# Patient Record
Sex: Male | Born: 1960 | Race: White | Hispanic: No | Marital: Single | Smoking: Former smoker
Health system: Southern US, Community
[De-identification: ages and names within clinical notes are randomized; demographics above are authoritative.]

## PROBLEM LIST (undated history)

## (undated) DIAGNOSIS — R569 Unspecified convulsions: Secondary | ICD-10-CM

## (undated) DIAGNOSIS — I10 Essential (primary) hypertension: Secondary | ICD-10-CM

## (undated) DIAGNOSIS — K250 Acute gastric ulcer with hemorrhage: Secondary | ICD-10-CM

## (undated) DIAGNOSIS — N200 Calculus of kidney: Secondary | ICD-10-CM

## (undated) DIAGNOSIS — Z87442 Personal history of urinary calculi: Secondary | ICD-10-CM

## (undated) HISTORY — DX: Essential (primary) hypertension: I10

## (undated) HISTORY — DX: Unspecified convulsions: R56.9

## (undated) HISTORY — DX: Personal history of urinary calculi: Z87.442

## (undated) HISTORY — DX: Calculus of kidney: N20.0

## (undated) HISTORY — PX: HAND SURGERY: SHX662

## (undated) HISTORY — DX: Acute gastric ulcer with hemorrhage: K25.0

---

## 1998-05-15 ENCOUNTER — Inpatient Hospital Stay (HOSPITAL_COMMUNITY): Admission: EM | Admit: 1998-05-15 | Discharge: 1998-05-21 | Payer: Self-pay | Admitting: Family Medicine

## 2002-01-18 ENCOUNTER — Encounter: Payer: Self-pay | Admitting: Preventative Medicine

## 2002-01-18 ENCOUNTER — Ambulatory Visit (HOSPITAL_COMMUNITY): Admission: RE | Admit: 2002-01-18 | Discharge: 2002-01-18 | Payer: Self-pay | Admitting: Preventative Medicine

## 2002-01-24 ENCOUNTER — Encounter (HOSPITAL_COMMUNITY): Admission: RE | Admit: 2002-01-24 | Discharge: 2002-02-23 | Payer: Self-pay | Admitting: Preventative Medicine

## 2006-09-17 ENCOUNTER — Ambulatory Visit (HOSPITAL_COMMUNITY): Admission: RE | Admit: 2006-09-17 | Discharge: 2006-09-17 | Payer: Self-pay | Admitting: Family Medicine

## 2006-09-17 ENCOUNTER — Encounter: Payer: Self-pay | Admitting: Vascular Surgery

## 2006-09-22 ENCOUNTER — Ambulatory Visit (HOSPITAL_COMMUNITY): Admission: RE | Admit: 2006-09-22 | Discharge: 2006-09-22 | Payer: Self-pay | Admitting: *Deleted

## 2006-09-30 ENCOUNTER — Ambulatory Visit (HOSPITAL_COMMUNITY): Admission: RE | Admit: 2006-09-30 | Discharge: 2006-09-30 | Payer: Self-pay | Admitting: Orthopedic Surgery

## 2006-09-30 ENCOUNTER — Encounter: Payer: Self-pay | Admitting: Cardiology

## 2008-12-17 ENCOUNTER — Inpatient Hospital Stay (HOSPITAL_COMMUNITY): Admission: EM | Admit: 2008-12-17 | Discharge: 2008-12-21 | Payer: Self-pay | Admitting: Emergency Medicine

## 2008-12-18 ENCOUNTER — Encounter: Payer: Self-pay | Admitting: Gastroenterology

## 2008-12-18 ENCOUNTER — Ambulatory Visit: Payer: Self-pay | Admitting: Gastroenterology

## 2008-12-19 ENCOUNTER — Encounter: Payer: Self-pay | Admitting: Gastroenterology

## 2008-12-20 ENCOUNTER — Encounter: Payer: Self-pay | Admitting: Gastroenterology

## 2008-12-25 ENCOUNTER — Encounter: Payer: Self-pay | Admitting: Gastroenterology

## 2008-12-27 ENCOUNTER — Telehealth (INDEPENDENT_AMBULATORY_CARE_PROVIDER_SITE_OTHER): Payer: Self-pay | Admitting: *Deleted

## 2008-12-27 ENCOUNTER — Encounter: Payer: Self-pay | Admitting: Gastroenterology

## 2008-12-28 ENCOUNTER — Ambulatory Visit: Payer: Self-pay | Admitting: Gastroenterology

## 2008-12-28 DIAGNOSIS — Z87442 Personal history of urinary calculi: Secondary | ICD-10-CM

## 2008-12-28 DIAGNOSIS — G40909 Epilepsy, unspecified, not intractable, without status epilepticus: Secondary | ICD-10-CM | POA: Insufficient documentation

## 2008-12-28 DIAGNOSIS — K250 Acute gastric ulcer with hemorrhage: Secondary | ICD-10-CM | POA: Insufficient documentation

## 2008-12-28 DIAGNOSIS — M129 Arthropathy, unspecified: Secondary | ICD-10-CM | POA: Insufficient documentation

## 2008-12-28 DIAGNOSIS — E785 Hyperlipidemia, unspecified: Secondary | ICD-10-CM | POA: Insufficient documentation

## 2008-12-28 HISTORY — DX: Acute gastric ulcer with hemorrhage: K25.0

## 2008-12-28 HISTORY — DX: Personal history of urinary calculi: Z87.442

## 2011-01-22 LAB — CBC
HCT: 23.6 % — ABNORMAL LOW (ref 39.0–52.0)
HCT: 23.7 % — ABNORMAL LOW (ref 39.0–52.0)
HCT: 24.1 % — ABNORMAL LOW (ref 39.0–52.0)
HCT: 25.1 % — ABNORMAL LOW (ref 39.0–52.0)
Hemoglobin: 5.9 g/dL — CL (ref 13.0–17.0)
Hemoglobin: 7.7 g/dL — CL (ref 13.0–17.0)
Hemoglobin: 8.6 g/dL — ABNORMAL LOW (ref 13.0–17.0)
MCHC: 35.1 g/dL (ref 30.0–36.0)
MCHC: 35.1 g/dL (ref 30.0–36.0)
MCHC: 35.6 g/dL (ref 30.0–36.0)
MCV: 89.6 fL (ref 78.0–100.0)
MCV: 90.2 fL (ref 78.0–100.0)
MCV: 90.4 fL (ref 78.0–100.0)
MCV: 90.8 fL (ref 78.0–100.0)
Platelets: 209 10*3/uL (ref 150–400)
Platelets: 212 10*3/uL (ref 150–400)
Platelets: 216 10*3/uL (ref 150–400)
Platelets: 216 10*3/uL (ref 150–400)
RBC: 2.4 MIL/uL — ABNORMAL LOW (ref 4.22–5.81)
RBC: 2.63 MIL/uL — ABNORMAL LOW (ref 4.22–5.81)
RBC: 2.66 MIL/uL — ABNORMAL LOW (ref 4.22–5.81)
RDW: 13.7 % (ref 11.5–15.5)
RDW: 14.1 % (ref 11.5–15.5)
RDW: 14.5 % (ref 11.5–15.5)
WBC: 4.7 10*3/uL (ref 4.0–10.5)
WBC: 5.1 10*3/uL (ref 4.0–10.5)
WBC: 5.2 10*3/uL (ref 4.0–10.5)
WBC: 5.8 10*3/uL (ref 4.0–10.5)
WBC: 6.1 10*3/uL (ref 4.0–10.5)

## 2011-01-22 LAB — DIFFERENTIAL
Basophils Absolute: 0 10*3/uL (ref 0.0–0.1)
Eosinophils Relative: 2 % (ref 0–5)
Lymphocytes Relative: 24 % (ref 12–46)
Lymphocytes Relative: 25 % (ref 12–46)
Lymphs Abs: 1.2 10*3/uL (ref 0.7–4.0)
Monocytes Absolute: 0.4 10*3/uL (ref 0.1–1.0)
Monocytes Absolute: 0.4 10*3/uL (ref 0.1–1.0)
Neutro Abs: 3.4 10*3/uL (ref 1.7–7.7)
Neutro Abs: 5.8 10*3/uL (ref 1.7–7.7)
Neutrophils Relative %: 71 % (ref 43–77)

## 2011-01-22 LAB — PROTIME-INR
INR: 1.1 (ref 0.00–1.49)
Prothrombin Time: 14.8 seconds (ref 11.6–15.2)

## 2011-01-22 LAB — COMPREHENSIVE METABOLIC PANEL
AST: 14 U/L (ref 0–37)
Albumin: 2.7 g/dL — ABNORMAL LOW (ref 3.5–5.2)
Calcium: 8.2 mg/dL — ABNORMAL LOW (ref 8.4–10.5)
Creatinine, Ser: 0.6 mg/dL (ref 0.4–1.5)
GFR calc Af Amer: >60 mL/min (ref >60)

## 2011-01-22 LAB — CROSSMATCH: Antibody Screen: NEGATIVE

## 2011-01-22 LAB — ABO/RH: ABO/RH(D): A POS

## 2011-01-22 LAB — POCT I-STAT, CHEM 8
BUN: 15 mg/dL (ref 6–23)
Calcium, Ion: 1.14 mmol/L (ref 1.12–1.32)
Chloride: 105 mEq/L (ref 96–112)
HCT: 17 % — ABNORMAL LOW (ref 39.0–52.0)
Potassium: 3.7 mEq/L (ref 3.5–5.1)
Sodium: 136 mEq/L (ref 135–145)

## 2011-01-22 LAB — PHENYTOIN LEVEL, TOTAL: Phenytoin Lvl: 8 ug/mL — ABNORMAL LOW (ref 10.0–20.0)

## 2011-01-22 LAB — LACTIC ACID, PLASMA: Lactic Acid, Venous: 2.4 mmol/L — ABNORMAL HIGH (ref 0.5–2.2)

## 2011-02-24 NOTE — H&P (Signed)
NAME:  Melvin Villarreal, Melvin Villarreal NO.:  [id number]   MEDICAL RECORD NO.:  [id number]          PATIENT TYPE:  EMS   LOCATION:  MAJO                         FACILITY:  [facility]   PHYSICIAN:  Lonia Blood, M.D.DATE OF BIRTH:  1960-11-15   DATE OF ADMISSION:  12/17/2008  DATE OF DISCHARGE:                              HISTORY & PHYSICAL   PRIMARY CARE PHYSICIAN:  Production assistant, radio.   CHIEF COMPLAINT:  Black stools and dizziness   HISTORY OF PRESENT ILLNESS:  [name] is a very pleasant,  otherwise reasonably healthy 50 year old gentleman with no history of GI  bleeding, who reported to the hospital today with a 4-day history of  dark pitch-black stools.  He reports multiple episodes a day.  He then  began to develop severe constipation and over the last 24 hours required  significant doses of a laxative to stimulate bowel movement which then  also was markedly dark and melanotic.  As stated this began 4 days ago.  Approximately 3 days ago he began to notice that he was getting weak  and could not exert himself physically without becoming very short of  breath.  Approximately 2 days ago he then began to notice that he was  losing my color with an extremely pale skin noted at the face.  Today,  the patient suffered an episode where he almost passed out after getting  up to go to the bathroom.  He developed severe lightheadedness and had  to sit down.  After sitting down his symptoms still continued for a  matter of minutes.  He has not lost consciousness.  He has had no bright  red blood per rectum.  He vehemently denies even minimal use of  nonsteroidal anti-inflammatories.  He has no prior history of GI  bleeding of any sort.  He does smoke one pack per day but he does not  drink at all.  He has no known history of reflux. The patient denies any  abdominal pain whatsoever apart from moderate cramping that he relates  to his constipation.   REVIEW OF  SYSTEMS:  As comprehensive review of systems is carried out  but is unremarkable with the exception of the positive elements noted in  the history of present illness above.   PAST MEDICAL HISTORY:  1. Seizure disorder.      a.     The patient cannot remember the last time that he had a       seizure.      b.     Ongoing use of Dilantin with no recent check of his levels.  2. History of ischemic left hand.      a.     Etiology not clear.      b.     Status post revascularization procedure at [facility].      c.     On Plavix since that time - 2008.   OUTPATIENT MEDICATIONS:  1. Plavix 75 mg daily.  2. Dilantin 800 mg q.h.s.   ALLERGIES:  No known drug allergies.   FAMILY HISTORY:  The patient's mother is alive and well.  She has had a  bout with breast cancer and apparently renal cell carcinoma but has no  history of gastric or colon cancer.  The patient's father is alive and  healthy with no history of cancer of any kind.   SOCIAL HISTORY:  [place]  He is  currently engaged.  He smokes one pack a day of cigarettes. He does not  drink alcohol at all.  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   DATA REVIEW:  His hemoglobin is 5.9, MCV is 92. White count and platelet  count are normal.  Electrolytes are all balanced. Creatinine is 0.7, BUN  is 15.  LFTs are normal.  Albumin is low at 2.7 with a low total protein  of 5.2, INR is 1.1.  Lactic acid is 2.4 which is borderline elevated.  The patient is guaiac positive of course with melanotic stools  appreciable.   PHYSICAL EXAMINATION:  VITAL SIGNS:  Temperature 98.3, blood pressure  140/77, heart rate 110, respiratory rate 20, O2 sat 1000% on room air.  GENERAL:  Well-developed, well-nourished. indeed pale gentleman in no  acute respiratory distress.  HEENT:  Normocephalic, atraumatic.  Oral cavity unremarkable with the  exception of dry mucous membranes.  NECK:   No JVD.  LUNGS: Clear to auscultation bilaterally without wheeze or rhonchi.  CARDIOVASCULAR:  Tachycardic but regular without appreciable murmur,  gallop or rub with normal S1 and S2.  ABDOMEN:  Overweight but soft bowel sounds present.  There is no  organomegaly, and there is no rebound.  There is no ascites.  EXTREMITIES: There is no significant cyanosis, clubbing, edema to  bilateral lower extremities.  NEUROLOGIC: Nonfocal neurologic exam.   IMPRESSION AND PLAN:  1. Acute/subacute upper gastrointestinal bleed with melena - the      patient is being admitted to the acute units.  We will place him on      Protonix drip.  He will be allowed clear liquid sips  tonight and      then we will transition him to n.p.o. status at midnight in      preparation for an EGD in the morning.  I have contacted the on-      call gastroenterologist who has agreed to evaluate the patient in      the morning for EGD and has made himself available for the night      should acute issues arise.  I do feel the patient need to be      stabilized with transfusion, however, prior to instrumentation.  2. Acute blood loss anemia - the source of the patient's blood loss is      clear.  He does present, however, with a severe symptomatic anemia.      We will began treatment transfusion of a minimum of 3 units of      packed red blood cells.  We will then reassess CBC on a q.8 h.      basis post transfusion.  We will continue to transfuse as needed to      maintain a hemoglobin of 8 or greater.  3. Seizure disorder - the patient cannot remember his last seizure.  I      will continue his Dilantin at the current dose but would not change      dosing regardless of his Dilantin levels in that he is not seizing  and therefore will not check a Dilantin level.  It is arguable that      the patient could even be tried without Dilantin given his      extremely long seizure-free period.  However, now is not the time       to make that adjustment.  4. Tobacco abuse - I have counseled the patient as to the multiple      deleterious effects of ongoing tobacco abuse.  I      have advised him that absolute abstinence is the most appropriate      action.  We will give him a nicotine patch during his hospital stay      and we will request tobacco cessation consultation to further drive      this point home during his hospital stay.      Lonia Blood, M.D.  Electronically Signed     JTM/MEDQ  D:  12/17/2008  T:  12/17/2008  Job:  [id number]   cc:   [facility]

## 2011-02-24 NOTE — Discharge Summary (Signed)
NAME:  Melvin Villarreal, Melvin Villarreal NO.:  [id number]   MEDICAL RECORD NO.:  [id number]          PATIENT TYPE:  INP   LOCATION:  5158                         FACILITY:  [facility]   PHYSICIAN:  Altha Harm, MDDATE OF BIRTH:  January 08, 1961   DATE OF ADMISSION:  12/17/2008  DATE OF DISCHARGE:  12/21/2008                               DISCHARGE SUMMARY   DISCHARGE DISPOSITION:  Home.   FINAL DISCHARGE DIAGNOSES:  1. Upper gastrointestinal bleed.  2. Acute blood loss anemia, hemoglobin now stable.  3. Helicobacter pylori.  4. Questionable mucosa-associated lymphoid tissue.  5. History of seizure disorder.  6. 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  7. Tobacco use disorder.   Discharge medications include the following:  1. Dilantin 800 mg p.o. bedtime.  2. Prevpac use as directed per Dr. Arlyce Dice, GI.  3. Protonix 40 mg p.o. daily.   Medication discontinued at this time includes Plavix 75 mg.  The patient  is supposed to receive direction from Dr. Arlyce Dice as to when he can  restart the Plavix.   CONSULTANT:  Barbette Hair. Arlyce Dice, M.D., [facility], gastroenterology.   PROCEDURES:  1. EGD, which showed an ulcer in the body of the stomach, otherwise      normal examination.  2. Colonoscopy with findings:  Terminal ileum appearing normal and a      normal colon.   DIAGNOSTIC STUDIES:  1. Meckel scan which was negative.  2. Endoscopic capsule results are still pending.   PERTINENT LABORATORY STUDIES:  Pathology report shows chronic active  gastritis with H. pylori organism.  Atypical lymphoid infiltrate could  likely represent lymphoproliferative process, specifically MALT.  Hemoglobin at the time of discharge 9.0, hematocrit 25.3.   CODE STATUS:  Full code.   ALLERGIES:  No known drug allergies.   CHIEF COMPLAINT:  Black stools and dizziness.   HISTORY OF PRESENT ILLNESS:  Please refer to the H and P by Dr.  Sharon Seller  for details of the HPI.   HOSPITAL COURSE:  1. The patient was admitted to the hospital and given supportive care.      In particular, he received transfusion of 2 units of packed red      blood cells initially on admission, and an additional 2 units of      packed red blood cells during his hospital stay for a total of 4      units of packed red blood cells.  The patient continued to have      melanotic stools and a gastrointestinal endoscopic examination was      embarked upon.  The findings are as noted above.  The investigation      still is not complete; however, the patient is clinically stable to      be discharged home both on my assessment and the assessment of Dr.      Arlyce Dice.  The patient is able to tolerate his diet at this time      without any difficulty, and he will follow up  with Dr. Arlyce Dice in      the office for an appointment that is scheduled on January 15, 2009 at      1:30 p.m.  At that time Dr. Arlyce Dice will further discuss with the      patient the results of the Endo Capsule and any further studies      that need to be carried out at that time.  In terms of his use of      Plavix, the patient had revascularization procedure of his left      upper extremity done several years ago at [facility].  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  He was then put on Plavix at that      time for maintenance of his revascularization.  However, in light      of the patient's GI bleed, the Plavix has been held at this time,      and the patient is supposed to check with Dr. Arlyce Dice to determine      when the Plavix can be restarted.  Attempts were made to reach the      vascular surgeon at [facility], however, it appears that he      is currently unavailable.  2. Seizure disorder:  The patient is continued on his Dilantin.  He      has had no seizure activity during this hospitalization.    Otherwise, the patient is stable.  He is tolerating diet well.  He is  able to ambulate without any difficulty.  Vital signs are all stable  today and are as follows:  Temperature 97.5, heart rate 75, blood  pressure 135/82, respiratory rate 18, O2 saturation is 97% on room air.  The patient is being discharged on the above medications.   DIETARY RESTRICTIONS:  None.   PHYSICAL RESTRICTIONS:  None.   FOLLOWUP:  Is with Dr. Arlyce Dice at appointment January 15, 2009 at 1:30, and  with his primary care physician at  [facility].   Total time for discharge 49 minutes.      Altha Harm, MD  Electronically Signed     MAM/MEDQ  D:  12/21/2008  T:  12/21/2008  Job:  [id number]   cc:   Barbette Hair. Arlyce Dice, MD,FACG  [facility]

## 2013-03-09 ENCOUNTER — Other Ambulatory Visit: Payer: Self-pay | Admitting: Family Medicine

## 2013-03-09 ENCOUNTER — Ambulatory Visit
Admission: RE | Admit: 2013-03-09 | Discharge: 2013-03-09 | Disposition: A | Discharge status: Home / Self Care | Payer: Managed Care, Other (non HMO) | Source: Ambulatory Visit | Attending: Family Medicine | Admitting: Family Medicine

## 2013-03-09 DIAGNOSIS — M545 Low back pain: Secondary | ICD-10-CM

## 2013-03-20 ENCOUNTER — Ambulatory Visit: Payer: Managed Care, Other (non HMO) | Attending: Family Medicine | Admitting: Physical Therapy

## 2013-03-20 DIAGNOSIS — M545 Low back pain, unspecified: Secondary | ICD-10-CM | POA: Insufficient documentation

## 2013-03-20 DIAGNOSIS — M546 Pain in thoracic spine: Secondary | ICD-10-CM | POA: Insufficient documentation

## 2013-03-20 DIAGNOSIS — R5381 Other malaise: Secondary | ICD-10-CM | POA: Insufficient documentation

## 2013-03-20 DIAGNOSIS — IMO0001 Reserved for inherently not codable concepts without codable children: Secondary | ICD-10-CM | POA: Insufficient documentation

## 2013-03-23 ENCOUNTER — Ambulatory Visit: Payer: Managed Care, Other (non HMO) | Admitting: *Deleted

## 2013-03-29 ENCOUNTER — Ambulatory Visit: Payer: Managed Care, Other (non HMO) | Admitting: *Deleted

## 2013-03-31 ENCOUNTER — Ambulatory Visit: Payer: Managed Care, Other (non HMO)

## 2013-04-05 ENCOUNTER — Ambulatory Visit: Payer: Managed Care, Other (non HMO) | Admitting: Physical Therapy

## 2013-04-07 ENCOUNTER — Ambulatory Visit: Payer: Managed Care, Other (non HMO) | Admitting: Physical Therapy

## 2013-04-12 ENCOUNTER — Ambulatory Visit: Payer: Managed Care, Other (non HMO) | Attending: Family Medicine | Admitting: Physical Therapy

## 2013-04-12 DIAGNOSIS — IMO0001 Reserved for inherently not codable concepts without codable children: Secondary | ICD-10-CM | POA: Insufficient documentation

## 2013-04-12 DIAGNOSIS — M545 Low back pain, unspecified: Secondary | ICD-10-CM | POA: Insufficient documentation

## 2013-04-12 DIAGNOSIS — M546 Pain in thoracic spine: Secondary | ICD-10-CM | POA: Insufficient documentation

## 2013-04-12 DIAGNOSIS — R5381 Other malaise: Secondary | ICD-10-CM | POA: Insufficient documentation

## 2013-04-19 ENCOUNTER — Ambulatory Visit: Payer: Managed Care, Other (non HMO) | Admitting: Physical Therapy

## 2013-04-21 ENCOUNTER — Ambulatory Visit: Payer: Managed Care, Other (non HMO) | Admitting: *Deleted

## 2013-04-26 ENCOUNTER — Ambulatory Visit: Payer: Managed Care, Other (non HMO) | Admitting: Physical Therapy

## 2013-04-28 ENCOUNTER — Ambulatory Visit: Payer: Managed Care, Other (non HMO) | Admitting: *Deleted

## 2013-05-03 ENCOUNTER — Ambulatory Visit: Payer: Managed Care, Other (non HMO) | Admitting: *Deleted

## 2015-11-15 DIAGNOSIS — N5089 Other specified disorders of the male genital organs: Secondary | ICD-10-CM | POA: Insufficient documentation

## 2015-11-15 DIAGNOSIS — F17210 Nicotine dependence, cigarettes, uncomplicated: Secondary | ICD-10-CM | POA: Insufficient documentation

## 2017-09-20 ENCOUNTER — Ambulatory Visit: Payer: Self-pay | Admitting: Family Medicine

## 2017-09-28 ENCOUNTER — Ambulatory Visit (INDEPENDENT_AMBULATORY_CARE_PROVIDER_SITE_OTHER): Payer: PRIVATE HEALTH INSURANCE

## 2017-09-28 ENCOUNTER — Encounter: Payer: Self-pay | Admitting: Family

## 2017-09-28 ENCOUNTER — Ambulatory Visit (INDEPENDENT_AMBULATORY_CARE_PROVIDER_SITE_OTHER): Payer: PRIVATE HEALTH INSURANCE | Admitting: Family

## 2017-09-28 VITALS — BP 155/70 | HR 81 | Temp 97.2°F | Ht 72.0 in | Wt 374.8 lb

## 2017-09-28 DIAGNOSIS — M1711 Unilateral primary osteoarthritis, right knee: Secondary | ICD-10-CM

## 2017-09-28 DIAGNOSIS — G8929 Other chronic pain: Secondary | ICD-10-CM

## 2017-09-28 DIAGNOSIS — R609 Edema, unspecified: Secondary | ICD-10-CM

## 2017-09-28 DIAGNOSIS — F17200 Nicotine dependence, unspecified, uncomplicated: Secondary | ICD-10-CM | POA: Diagnosis not present

## 2017-09-28 DIAGNOSIS — I10 Essential (primary) hypertension: Secondary | ICD-10-CM

## 2017-09-28 DIAGNOSIS — M25561 Pain in right knee: Secondary | ICD-10-CM | POA: Diagnosis not present

## 2017-09-28 MED ORDER — MELOXICAM 15 MG PO TABS: 15.0000 mg | ORAL_TABLET | Freq: Every day | ORAL | 0 refills | Status: DC

## 2017-09-28 NOTE — Patient Instructions (Signed)
Knee Osteoarthritis Treatment Decision Aid Introduction If you have osteoarthritis in your knees, you may have several treatment options. This document will review two of these options.  What are my treatment options? ? Surgery ? Non-surgical treatment may include one option or a combination of several of the following options:: Surgery for this condition is total knee replacement. ? Exercise programs and working with a physical therapist.: This is a procedure to replace the knee joint with an artificial (prosthetic) knee joint. ? Devices to help you move around (assistive devices), like a brace, wrap, splint, or wedge insoles.: It replaces parts of the thigh bone (femur), lower leg bone (tibia), and kneecap (patella) that are removed during the procedure. ? Medicines.: ? Shots of medicine to relieve pain and inflammation, such as steroids and hyaluronic acid.: ? Using a small device which delivers mild pulses through to nerve endings to relieve pain (transcutaneous electrical stimulation, TENS).: ? Applying heat and cold to the knee.: ? Massage.: ? Insertion of needles into certain places on the skin to relieve pain (acupuncture).: ? A plan to control your weight or to lose weight, if you are overweight.:  Who is eligible?  Good candidates may include: ? Surgery ? People who have pain that is tolerable.: People who have pain that does not get better with non-surgical treatments and medicines. ? People who are not allergic to medicines that are used.: People who have moderate or severe pain that makes it hard to do physical activities like walking, rising from a chair, or using stairs. ? People who do not have an illness that makes medicines or injections risky.: People who have pain that affects quality of sleep. ? People who are motivated to do exercise programs or lose weight.: People who do not have an illness that makes surgery risky. ? People who do not have an infection, too much  fluid, or skin breakdown in the knee area.: What are the benefits?  Benefits may include: ? Surgery ? Improved mobility and less pain.: Improved mobility and less pain. ? Not needing surgery.: Pain likely being gone within 2 years after surgery. ? Pain relief that lasts for 6-12 months, starting 1-2 days after steroid injections.: ? Pain relief that lasts for several months, starting several weeks after hyaluronic acid injections.: What are the risks?  Risks may include: ? Surgery ? Failure to relieve symptoms.: Failure to relieve symptoms. ? Joint damage that may continue to get worse.: Instability of the knee. ? Medicine or injection side effects.: Damage to blood vessels, nerves, or other structures in the knee. ? Bruising or bleeding with acupuncture therapy.: Bleeding or a blood clot. ? Skin injury due to incorrect application of heat or cold therapy.: Infection. ? Bruising due to massage.: Decreased range of motion of the knee. ? Increased pain due to exercise programs.: Loosening of the prosthetic joint. ? : Needing knee replacement surgery again in 15-20 years to replace the prosthetic joint. What preparation is needed?  Preparation may include: ? Surgery ? Physical evaluation. This may involve lab work and imaging tests, such as X-rays, MRI, CT scan, or bone scans.: Physical evaluation. This may involve lab work and imaging tests, such as X-rays, MRI, CT scan, or bone scans. ? Planning to have someone take you home from the hospital or clinic, if you have knee injections.: Planning to have someone take you home from the hospital. ? : Arranging for someone to help you for a few days after surgery. ? : Working  with specialists, if you have other medical conditions. ? : Preparing your home for your recovery. ? : Losing weight, if recommended by your provider. ? : Not using any products that contain nicotine or tobacco, such as cigarettes and e-cigarettes. If you need help  quitting, ask your health care provider. ? : Doing exercises as directed by your provider. ? : Having dental care and routine cleanings completed before your procedure. (You should not have dental work done for 3 months after your procedure.) The exact tests and frequency of office visits will be decided by your health care provider based on your individual condition. What are the restrictions after?  Restrictions may include: ? Surgery ? Needing to rest after pain-relief therapies.: Not taking baths, swimming, or using a hot tub until your provider approves. ? Not driving, if you are taking medicines that make you sleepy.: Not playing contact sports until your provider approves. ? Avoiding activities that require a lot of energy for a couple of days after knee injections.: Not driving or using heavy machinery until your provider approves. What can I expect from recovery?  You may experience: ? Surgery ? Tiredness, after therapies for pain relief.: Pain. Medicines and pain relief techniques will help make pain tolerable. ? Needing to move your knee through its full range of motion after knee injections, to get all of the medicine into your knee joint.: Fluid draining from your incision through a tube (drain) for a couple of days. ? Monitoring for a few hours after knee injections to make sure you do not have a reaction to the medicine.: Limited range of motion in the knee that gradually improves over time. What is the impact to quality of life?  Impact to quality of life may include: ? Surgery ? Living with certain side effects of medicines.: Needing to stay in the hospital for a few days after your procedure. ? Needing up to five injections over a period of five weeks, for certain kinds of hyaluronic acid injections.: Needing to use a walker for several weeks. ? Limited physical activity, depending on your symptoms.: Limited physical activity for several weeks. ? Impact to your daily  schedule, including work, to make time for therapy and follow-up visits.: Impact to your daily schedule, including work, to make time for therapy and follow-up visits. ? : ? : The frequency of office visits, therapy, and injections will be decided by your health care provider and care team based on your individual condition. What is the cost?  Costs may include: ? Surgery - $$$ ? Ongoing medicines.: Procedures and recovery. ? Office visits.: Office visits. ? Therapy visits.: Therapy visits. ? Knee injection procedures.: ? Assistive devices.: Exact costs will vary based on your location and insurance plan. Contact your insurance company to find out what is covered. Questions to ask your health care provider:  Ask: ? Surgery ? Am I eligible for this option?: Am I eligible for this option? ? What are my personal risks?: What are my personal risks? ? How often will I need office or therapy visits?: How often will I need office or therapy visits? ? What are the chances that I will need future treatment?: What are the chances that I will need another surgery? ? : How effective is the surgery? Follow these instructions at home: Review these options and decide which one may be right for you.  My decision: ? Non-Surgical Treatment ? This is right for me: ? This is not right for  me: ? I am unsure right now: ? Surgery ? This is right for me: ? This is not right for me: ? I am unsure right now:  This information is not intended to replace advice given to you by your health care provider. Make sure you discuss any questions you have with your health care provider. Document Released: 08/20/2016 Document Revised: 08/20/2016 Document Reviewed: 08/20/2016 Elsevier Interactive Patient Education  [employer].

## 2017-09-28 NOTE — Progress Notes (Signed)
Subjective:    Patient ID: Melvin Villarreal, male    DOB: 02-11-1961, 56 y.o.   MRN: [id number]  PT presents to the office today to establish care and complaints of right knee pain. Pt also complaining of chronic peripheral edema with discoloration of bilateral legs. Pt currently taking lasix that helps, but at his job is "contstantly on my feet and that makes it worse".  Knee Pain   The incident occurred more than 1 week ago. Injury mechanism: previous injury when younger. The pain is present in the right knee. The quality of the pain is described as aching. The pain is at a severity of 6/10. The pain is moderate. The pain has been intermittent since onset. Pertinent negatives include no numbness or tingling. He reports no foreign bodies present. The symptoms are aggravated by weight bearing. He has tried rest, NSAIDs and acetaminophen for the symptoms. The treatment provided mild relief.  Hypertension  This is a chronic problem. The current episode started more than 1 year ago. The problem has been waxing and waning since onset. The problem is uncontrolled. Associated symptoms include peripheral edema.      Review of Systems  Neurological: Negative for tingling and numbness.  All other systems reviewed and are negative.  History reviewed. No pertinent family history.  Social History   Socioeconomic History  . Marital status: Single    Spouse name: None  . Number of children: None  . Years of education: None  . Highest education level: None  Social Needs  . Financial resource strain: None  . Food insecurity - worry: None  . Food insecurity - inability: None  . Transportation needs - medical: None  . Transportation needs - non-medical: None  Occupational History  . None  Tobacco Use  . Smoking status: Current Every Day Smoker    Packs/day: 1.00    Years: 35.00    Pack years: 35.00    Types: Cigarettes  . Smokeless tobacco: Never Used  Substance and Sexual Activity  .  Alcohol use: No    Frequency: Never  . Drug use: Yes    Types: "Crack" cocaine  . Sexual activity: None  Other Topics Concern  . None  Social History Narrative  . None       Objective:   Physical Exam  Constitutional: He is oriented to person, place, and time. He appears well-developed and well-nourished. No distress.  HENT:  Head: Normocephalic.  Eyes: Pupils are equal, round, and reactive to light. Right eye exhibits no discharge. Left eye exhibits no discharge.  Neck: Normal range of motion. Neck supple. No thyromegaly present.  Cardiovascular: Normal rate, regular rhythm, normal heart sounds and intact distal pulses.  No murmur heard. Pulmonary/Chest: Effort normal and breath sounds normal. No respiratory distress. He has no wheezes.  Musculoskeletal: Normal range of motion. He exhibits edema (2+ BLE) and tenderness (right knee tenderness with flexion and extension, crepitus present).  Neurological: He is alert and oriented to person, place, and time.  Skin: Skin is warm and dry. No rash noted. No erythema.  Psychiatric: He has a normal mood and affect. His behavior is normal. Judgment and thought content normal.  Vitals reviewed.    BP (!) 155/70   Pulse 81   Temp (!) 97.2 F (36.2 C) (Oral)   Ht 6' (1.829 m)   Wt (!) 374 lb 12.8 oz (170 kg)   BMI 50.83 kg/m      Assessment & Plan:  1.  Chronic pain of right knee - DG Knee 1-2 Views Right; Future - meloxicam (MOBIC) 15 MG tablet; Take 1 tablet (15 mg total) by mouth daily.  Dispense: 30 tablet; Refill: 0  2. Primary osteoarthritis of right knee Rest Ice  ROM exercises  Stop aleve and start mobic daily- No other NSAID's If pain does not improve will do joint injection and/or referral to Ortho - DG Knee 1-2 Views Right; Future - meloxicam (MOBIC) 15 MG tablet; Take 1 tablet (15 mg total) by mouth daily.  Dispense: 30 tablet; Refill: 0 -BMP  3. Current smoker Smoking cessation discussed  4. Morbid obesity  ([place]) Weight loss will help with pain  5. Peripheral edema Continue lasix  - Compression stockings    6. Essential hypertension Continue medications Follow up in 1 month if BP still elevated will add medications BMP pending    Evelina Dun, FNP

## 2017-09-29 LAB — BMP8+EGFR
BUN/Creatinine Ratio: 18 (ref 9–20)
BUN: 12 mg/dL (ref 6–24)
CALCIUM: 9.1 mg/dL (ref 8.7–10.2)
CO2: 27 mmol/L (ref 20–29)
CREATININE: 0.68 mg/dL — AB (ref 0.76–1.27)
Chloride: 103 mmol/L (ref 96–106)
GFR calc Af Amer: 125 mL/min/{1.73_m2} (ref >59)
GFR, EST NON AFRICAN AMERICAN: 108 mL/min/{1.73_m2} (ref >59)
GLUCOSE: 94 mg/dL (ref 65–99)
Potassium: 4.3 mmol/L (ref 3.5–5.2)
Sodium: 143 mmol/L (ref 134–144)

## 2017-10-25 ENCOUNTER — Other Ambulatory Visit: Payer: Self-pay | Admitting: Family

## 2017-10-25 DIAGNOSIS — M25561 Pain in right knee: Principal | ICD-10-CM

## 2017-10-25 DIAGNOSIS — G8929 Other chronic pain: Secondary | ICD-10-CM

## 2017-10-25 DIAGNOSIS — M1711 Unilateral primary osteoarthritis, right knee: Secondary | ICD-10-CM

## 2017-11-01 ENCOUNTER — Encounter: Payer: Self-pay | Admitting: Family

## 2017-11-01 ENCOUNTER — Ambulatory Visit (INDEPENDENT_AMBULATORY_CARE_PROVIDER_SITE_OTHER): Payer: 59 | Admitting: Family

## 2017-11-01 VITALS — BP 152/87 | HR 76 | Temp 97.2°F | Ht 72.0 in | Wt 384.6 lb

## 2017-11-01 DIAGNOSIS — Z1159 Encounter for screening for other viral diseases: Secondary | ICD-10-CM

## 2017-11-01 DIAGNOSIS — G40909 Epilepsy, unspecified, not intractable, without status epilepticus: Secondary | ICD-10-CM

## 2017-11-01 DIAGNOSIS — R609 Edema, unspecified: Secondary | ICD-10-CM

## 2017-11-01 DIAGNOSIS — Z1211 Encounter for screening for malignant neoplasm of colon: Secondary | ICD-10-CM

## 2017-11-01 DIAGNOSIS — F17200 Nicotine dependence, unspecified, uncomplicated: Secondary | ICD-10-CM

## 2017-11-01 DIAGNOSIS — I739 Peripheral vascular disease, unspecified: Secondary | ICD-10-CM

## 2017-11-01 DIAGNOSIS — E785 Hyperlipidemia, unspecified: Secondary | ICD-10-CM

## 2017-11-01 DIAGNOSIS — R600 Localized edema: Secondary | ICD-10-CM

## 2017-11-01 DIAGNOSIS — I10 Essential (primary) hypertension: Secondary | ICD-10-CM

## 2017-11-01 DIAGNOSIS — Z0000 Encounter for general adult medical examination without abnormal findings: Secondary | ICD-10-CM

## 2017-11-01 DIAGNOSIS — T63301A Toxic effect of unspecified spider venom, accidental (unintentional), initial encounter: Secondary | ICD-10-CM

## 2017-11-01 MED ORDER — AMLODIPINE BESYLATE 10 MG PO TABS: 10.0000 mg | ORAL_TABLET | Freq: Every day | ORAL | 3 refills | Status: DC

## 2017-11-01 MED ORDER — DOXYCYCLINE HYCLATE 100 MG PO TABS: 100.0000 mg | ORAL_TABLET | Freq: Two times a day (BID) | ORAL | 0 refills | Status: DC

## 2017-11-01 NOTE — Patient Instructions (Signed)
Peripheral Vascular Disease Peripheral vascular disease (PVD) is a disease of the blood vessels that are not part of your heart and brain. A simple term for PVD is poor circulation. In most cases, PVD narrows the blood vessels that carry blood from your heart to the rest of your body. This can result in a decreased supply of blood to your arms, legs, and internal organs, like your stomach or kidneys. However, it most often affects a person's lower legs and feet. There are two types of PVD.  Organic PVD. This is the more common type. It is caused by damage to the structure of blood vessels.  Functional PVD. This is caused by conditions that make blood vessels contract and tighten (spasm).  Without treatment, PVD tends to get worse over time. PVD can also lead to acute ischemic limb. This is when an arm or limb suddenly has trouble getting enough blood. This is a medical emergency. What are the causes? Each type of PVD has many different causes. The most common cause of PVD is buildup of a fatty material (plaque) inside of your arteries (atherosclerosis). Small amounts of plaque can break off from the walls of the blood vessels and become lodged in a smaller artery. This blocks blood flow and can cause acute ischemic limb. Other common causes of PVD include:  Blood clots that form inside of blood vessels.  Injuries to blood vessels.  Diseases that cause inflammation of blood vessels or cause blood vessel spasms.  Health behaviors and health history that increase your risk of developing PVD.  What increases the risk? You may have a greater risk of PVD if you:  Have a family history of PVD.  Have certain medical conditions, including: ? High cholesterol. ? Diabetes. ? High blood pressure (hypertension). ? Coronary heart disease. ? Past problems with blood clots. ? Past injury, such as burns or a broken bone. These may have damaged blood vessels in your limbs. ? Buerger disease. This is  caused by inflamed blood vessels in your hands and feet. ? Some forms of arthritis. ? Rare birth defects that affect the arteries in your legs.  Use tobacco.  Do not get enough exercise.  Are obese.  Are age 50 or older.  What are the signs or symptoms? PVD may cause many different symptoms. Your symptoms depend on what part of your body is not getting enough blood. Some common signs and symptoms include:  Cramps in your lower legs. This may be a symptom of poor leg circulation (claudication).  Pain and weakness in your legs while you are physically active that goes away when you rest (intermittent claudication).  Leg pain when at rest.  Leg numbness, tingling, or weakness.  Coldness in a leg or foot, especially when compared with the other leg.  Skin or hair changes. These can include: ? Hair loss. ? Shiny skin. ? Pale or bluish skin. ? Thick toenails.  Inability to get or maintain an erection (erectile dysfunction).  People with PVD are more prone to developing ulcers and sores on their toes, feet, or legs. These may take longer than normal to heal. How is this diagnosed? Your health care provider may diagnose PVD from your signs and symptoms. The health care provider will also do a physical exam. You may have tests to find out what is causing your PVD and determine its severity. Tests may include:  Blood pressure recordings from your arms and legs and measurements of the strength of your pulses (  pulse volume recordings).  Imaging studies using sound waves to take pictures of the blood flow through your blood vessels (Doppler ultrasound).  Injecting a dye into your blood vessels before having imaging studies using: ? X-rays (angiogram or arteriogram). ? Computer-generated X-rays (CT angiogram). ? A powerful electromagnetic field and a computer (magnetic resonance angiogram or MRA).  How is this treated? Treatment for PVD depends on the cause of your condition and the  severity of your symptoms. It also depends on your age. Underlying causes need to be treated and controlled. These include long-lasting (chronic) conditions, such as diabetes, high cholesterol, and high blood pressure. You may need to first try making lifestyle changes and taking medicines. Surgery may be needed if these do not work. Lifestyle changes may include:  Quitting smoking.  Exercising regularly.  Following a low-fat, low-cholesterol diet.  Medicines may include:  Blood thinners to prevent blood clots.  Medicines to improve blood flow.  Medicines to improve your blood cholesterol levels.  Surgical procedures may include:  A procedure that uses an inflated balloon to open a blocked artery and improve blood flow (angioplasty).  A procedure to put in a tube (stent) to keep a blocked artery open (stent implant).  Surgery to reroute blood flow around a blocked artery (peripheral bypass surgery).  Surgery to remove dead tissue from an infected wound on the affected limb.  Amputation. This is surgical removal of the affected limb. This may be necessary in cases of acute ischemic limb that are not improved through medical or surgical treatments.  Follow these instructions at home:  Take medicines only as directed by your health care provider.  Do not use any tobacco products, including cigarettes, chewing tobacco, or electronic cigarettes. If you need help quitting, ask your health care provider.  Lose weight if you are overweight, and maintain a healthy weight as directed by your health care provider.  Eat a diet that is low in fat and cholesterol. If you need help, ask your health care provider.  Exercise regularly. Ask your health care provider to suggest some good activities for you.  Use compression stockings or other mechanical devices as directed by your health care provider.  Take good care of your feet. ? Wear comfortable shoes that fit well. ? Check your feet  often for any cuts or sores. Contact a health care provider if:  You have cramps in your legs while walking.  You have leg pain when you are at rest.  You have coldness in a leg or foot.  Your skin changes.  You have erectile dysfunction.  You have cuts or sores on your feet that are not healing. Get help right away if:  Your arm or leg turns cold and blue.  Your arms or legs become red, warm, swollen, painful, or numb.  You have chest pain or trouble breathing.  You suddenly have weakness in your face, arm, or leg.  You become very confused or lose the ability to speak.  You suddenly have a very bad headache or lose your vision. This information is not intended to replace advice given to you by your health care provider. Make sure you discuss any questions you have with your health care provider. Document Released: 11/05/2004 Document Revised: 03/05/2016 Document Reviewed: 03/08/2014 Elsevier Interactive Patient Education  2017 [employer]  

## 2017-11-01 NOTE — Progress Notes (Signed)
Subjective:    Patient ID: Melvin Villarreal, male    DOB: 1961/09/08, 57 y.o.   MRN: [id number]  PT presents to the office today for CPE and  to recheck HTN. Pt's BP is not at goal today.   PT states he was bite by a spider about a month ago on the back of his neck. States it has "came to a head and drained"  Twice and thought it was getting better, however over the last two weeks it has started to swell and become more tender.  Hypertension  This is a chronic problem. The current episode started more than 1 year ago. The problem has been waxing and waning since onset. The problem is uncontrolled. Associated symptoms include peripheral edema. Pertinent negatives include no headaches or shortness of breath. Past treatments include diuretics, ACE inhibitors and beta blockers. The current treatment provides moderate improvement. There is no history of kidney disease, CAD/MI or heart failure.  Seizures   This is a chronic problem. The current episode started more than 1 week ago. The problem has been resolved (has not had one since 1989). Pertinent negatives include no headaches.  Peripheral Edema/PVD PT complaining of bilateral lower extremity swelling. Takes lasix that does not seem to help. States he continues to  Have pain in his calf when he     Review of Systems  Respiratory: Negative for shortness of breath.   Neurological: Positive for seizures. Negative for headaches.  All other systems reviewed and are negative.      Objective:   Physical Exam  Constitutional: He is oriented to person, place, and time. He appears well-developed and well-nourished. No distress.  Morbid obesity   HENT:  Head: Normocephalic.  Right Ear: External ear normal.  Left Ear: External ear normal.  Nose: Nose normal.  Mouth/Throat: Oropharynx is clear and moist.  Eyes: Pupils are equal, round, and reactive to light. Right eye exhibits no discharge. Left eye exhibits no discharge.  Neck: Normal range of  motion. Neck supple. No thyromegaly present.  Cardiovascular: Normal rate, regular rhythm, normal heart sounds and intact distal pulses.  No murmur heard. Pulmonary/Chest: Effort normal and breath sounds normal. No respiratory distress. He has no wheezes.  Abdominal: Soft. Bowel sounds are normal. He exhibits no distension. There is no tenderness.  Musculoskeletal: Normal range of motion. He exhibits edema (2 + BLE) and tenderness.  Discoloration of bilateral legs  Neurological: He is alert and oriented to person, place, and time.  Skin: Skin is warm and dry. No rash noted. No erythema.  Psychiatric: He has a normal mood and affect. His behavior is normal. Judgment and thought content normal.  Vitals reviewed.    BP (!) 152/87   Pulse 76   Temp (!) 97.2 F (36.2 C) (Oral)   Ht 6' (1.829 m)   Wt (!) 384 lb 9.6 oz (174.5 kg)   BMI 52.16 kg/m      Assessment & Plan:  1. Annual physical exam - CMP14+EGFR - CBC with Differential/Platelet - Lipid panel - TSH - VITAMIN D 25 Hydroxy (Vit-D Deficiency, Fractures) - PSA, total and free - Hepatitis C antibody - HIV antibody  2. Essential hypertension Norvasc increased to 10 mg from 5 mg  -Daily blood pressure log given with instructions on how to fill out and told to bring to next visit -Dash diet information given -Exercise encouraged - Stress Management  -Continue current meds - CMP14+EGFR - CBC with Differential/Platelet - Ambulatory referral to  Vascular Surgery - amLODipine (NORVASC) 10 MG tablet; Take 1 tablet (10 mg total) by mouth daily.  Dispense: 90 tablet; Refill: 3  3. Hyperlipidemia, unspecified hyperlipidemia type - CMP14+EGFR - CBC with Differential/Platelet  4. Morbid obesity ([place]) - CMP14+EGFR - CBC with Differential/Platelet  5. Peripheral edema - CMP14+EGFR - CBC with Differential/Platelet - Ambulatory referral to Vascular Surgery - Compression stockings  6. Current smoker - CMP14+EGFR - CBC with  Differential/Platelet - Ambulatory referral to Vascular Surgery - Compression stockings  7. Seizure disorder ([place]) - CMP14+EGFR - CBC with Differential/Platelet - Phenytoin level, free and total  8. Colon cancer screening - CMP14+EGFR - CBC with Differential/Platelet - Ambulatory referral to Gastroenterology  9. PVD (peripheral vascular disease) ([place]) Quit smoking  - CMP14+EGFR - CBC with Differential/Platelet - Ambulatory referral to Vascular Surgery - Compression stockings  10. Need for hepatitis C screening test - CMP14+EGFR - CBC with Differential/Platelet - Hepatitis C antibody  11. Spider bite wound, accidental or unintentional, initial encounter - doxycycline (VIBRA-TABS) 100 MG tablet; Take 1 tablet (100 mg total) by mouth 2 (two) times daily.  Dispense: 20 tablet; Refill: 0  Continue all meds Labs pending Health Maintenance reviewed Diet and exercise encouraged RTO 2 weeks to recheck HTN  Evelina Dun, FNP

## 2017-11-02 ENCOUNTER — Telehealth: Payer: Self-pay

## 2017-11-02 ENCOUNTER — Telehealth: Payer: Self-pay | Admitting: Family

## 2017-11-02 ENCOUNTER — Other Ambulatory Visit: Payer: Self-pay | Admitting: Family

## 2017-11-02 DIAGNOSIS — G40909 Epilepsy, unspecified, not intractable, without status epilepticus: Secondary | ICD-10-CM

## 2017-11-02 DIAGNOSIS — E559 Vitamin D deficiency, unspecified: Secondary | ICD-10-CM

## 2017-11-02 LAB — LIPID PANEL
CHOL/HDL RATIO: 4 ratio (ref 0.0–5.0)
Cholesterol, Total: 186 mg/dL (ref 100–199)
HDL: 46 mg/dL (ref >39)
LDL Calculated: 127 mg/dL — ABNORMAL HIGH (ref 0–99)
TRIGLYCERIDES: 64 mg/dL (ref 0–149)
VLDL Cholesterol Cal: 13 mg/dL (ref 5–40)

## 2017-11-02 LAB — CMP14+EGFR
ALBUMIN: 4.3 g/dL (ref 3.5–5.5)
ALK PHOS: 108 IU/L (ref 39–117)
ALT: 23 IU/L (ref 0–44)
AST: 19 IU/L (ref 0–40)
Albumin/Globulin Ratio: 1.6 (ref 1.2–2.2)
BILIRUBIN TOTAL: 0.5 mg/dL (ref 0.0–1.2)
BUN / CREAT RATIO: 22 — AB (ref 9–20)
BUN: 15 mg/dL (ref 6–24)
CO2: 30 mmol/L — ABNORMAL HIGH (ref 20–29)
Calcium: 9.3 mg/dL (ref 8.7–10.2)
Chloride: 102 mmol/L (ref 96–106)
Creatinine, Ser: 0.69 mg/dL — ABNORMAL LOW (ref 0.76–1.27)
GFR calc Af Amer: 124 mL/min/{1.73_m2} (ref >59)
GFR calc non Af Amer: 108 mL/min/{1.73_m2} (ref >59)
GLUCOSE: 117 mg/dL — AB (ref 65–99)
Globulin, Total: 2.7 g/dL (ref 1.5–4.5)
Potassium: 4.2 mmol/L (ref 3.5–5.2)
SODIUM: 142 mmol/L (ref 134–144)
Total Protein: 7 g/dL (ref 6.0–8.5)

## 2017-11-02 LAB — CBC WITH DIFFERENTIAL/PLATELET
Basophils Absolute: 0 10*3/uL (ref 0.0–0.2)
Basos: 0 %
EOS (ABSOLUTE): 0.1 10*3/uL (ref 0.0–0.4)
Eos: 1 %
Hematocrit: 44.8 % (ref 37.5–51.0)
Hemoglobin: 14.6 g/dL (ref 13.0–17.7)
Immature Grans (Abs): 0 10*3/uL (ref 0.0–0.1)
Immature Granulocytes: 0 %
LYMPHS ABS: 1.1 10*3/uL (ref 0.7–3.1)
Lymphs: 14 %
MCH: 30.8 pg (ref 26.6–33.0)
MCHC: 32.6 g/dL (ref 31.5–35.7)
MCV: 95 fL (ref 79–97)
Monocytes Absolute: 0.5 10*3/uL (ref 0.1–0.9)
Monocytes: 6 %
Neutrophils Absolute: 6.4 10*3/uL (ref 1.4–7.0)
Neutrophils: 79 %
Platelets: 164 10*3/uL (ref 150–379)
RBC: 4.74 x10E6/uL (ref 4.14–5.80)
RDW: 14.2 % (ref 12.3–15.4)
WBC: 8.1 10*3/uL (ref 3.4–10.8)

## 2017-11-02 LAB — HEPATITIS C ANTIBODY: Hep C Virus Ab: <0.1 s/co ratio (ref 0.0–0.9)

## 2017-11-02 LAB — VITAMIN D 25 HYDROXY (VIT D DEFICIENCY, FRACTURES): Vit D, 25-Hydroxy: 11.8 ng/mL — ABNORMAL LOW (ref 30.0–100.0)

## 2017-11-02 LAB — PHENYTOIN LEVEL, FREE AND TOTAL
PHENYTOIN, SERUM: 5 ug/mL — AB (ref 10.0–20.0)
Phenytoin, Free: NOT DETECTED ug/mL (ref 1.0–2.0)

## 2017-11-02 LAB — PSA, TOTAL AND FREE
PROSTATE SPECIFIC AG, SERUM: 0.6 ng/mL (ref 0.0–4.0)
PSA FREE: 0.12 ng/mL
PSA, Free Pct: 20 %

## 2017-11-02 LAB — TSH: TSH: 1.27 u[IU]/mL (ref 0.450–4.500)

## 2017-11-02 LAB — HIV ANTIBODY (ROUTINE TESTING W REFLEX): HIV Screen 4th Generation wRfx: NONREACTIVE

## 2017-11-02 MED ORDER — VITAMIN D (ERGOCALCIFEROL) 1.25 MG (50000 UNIT) PO CAPS: 50000.0000 [IU] | ORAL_CAPSULE | ORAL | 3 refills | Status: AC

## 2017-11-02 MED ORDER — ATORVASTATIN CALCIUM 20 MG PO TABS: 20.0000 mg | ORAL_TABLET | Freq: Every day | ORAL | 1 refills | Status: DC

## 2017-11-02 NOTE — Telephone Encounter (Signed)
Patient aware that rx was sent to pharmacy.

## 2017-11-02 NOTE — Telephone Encounter (Signed)
Left message for patient to call back. I have scheduled him for [facility] on 12/14/17 at 10:15 for colonoscopy.

## 2017-11-02 NOTE — Telephone Encounter (Signed)
-----   Message from Yetta Flock, MD sent at 11/02/2017 10:18 AM EST ----- Thanks Almyra Free, Do you know how this patient was scheduled? Was it direct for screening?  Yes he will need approval to hold plavix for 5 days prior to the procedure. Thanks    ----- Message ----- From: Doristine Counter, RN Sent: 11/02/2017  10:10 AM To: Yetta Flock, MD  Patient is on Plavix, BMI >50 to be scheduled at [facility], do you want him off of Plavix 5 days?

## 2017-11-02 NOTE — Telephone Encounter (Signed)
Spoke to patient and he is aware of scheduled colonoscopy on 3/5 at [facility]. I let him know that I would send out the prep instructions once I have heard back re: Plavix. Patient aware that he will need someone to drive him home that day.

## 2017-11-02 NOTE — Telephone Encounter (Signed)
Sent letter to Evelina Dun, FNP re: Plavix. Patient scheduled as direct for screening colonoscopy.

## 2017-11-05 ENCOUNTER — Other Ambulatory Visit: Payer: Self-pay | Admitting: Family

## 2017-11-05 NOTE — Progress Notes (Signed)
Please fax letter to GI stating pt can be off his Plavix for 5 days for his procedure. Thanks

## 2017-11-13 ENCOUNTER — Other Ambulatory Visit: Payer: Self-pay | Admitting: Family

## 2017-11-13 DIAGNOSIS — T63301A Toxic effect of unspecified spider venom, accidental (unintentional), initial encounter: Secondary | ICD-10-CM

## 2017-11-18 ENCOUNTER — Other Ambulatory Visit: Payer: Self-pay

## 2017-11-18 ENCOUNTER — Telehealth: Payer: Self-pay

## 2017-11-18 DIAGNOSIS — Z1211 Encounter for screening for malignant neoplasm of colon: Secondary | ICD-10-CM

## 2017-11-18 MED ORDER — PEG-KCL-NACL-NASULF-NA ASC-C 140 G PO SOLR: 140.0000 g | ORAL | 0 refills | Status: DC

## 2017-11-18 NOTE — Telephone Encounter (Signed)
Received okay from [facility], FNP to stop Plavix 5 days prior, may resume 2 days after procedure. Sent patient prep instructions that include directions on stopping Plavix 5 days prior. Called patient to let him know to expect this in the mail and verbally review instructions. He is aware that he can pick up the Plenvu prep at his pharmacy.

## 2017-11-19 ENCOUNTER — Ambulatory Visit (INDEPENDENT_AMBULATORY_CARE_PROVIDER_SITE_OTHER): Payer: 59 | Admitting: Family

## 2017-11-19 ENCOUNTER — Encounter: Payer: Self-pay | Admitting: Family

## 2017-11-19 VITALS — BP 152/87 | HR 78 | Temp 97.1°F | Ht 72.0 in | Wt 393.6 lb

## 2017-11-19 DIAGNOSIS — L0291 Cutaneous abscess, unspecified: Secondary | ICD-10-CM | POA: Diagnosis not present

## 2017-11-19 NOTE — Progress Notes (Signed)
   Subjective:    Patient ID: Melvin Villarreal, male    DOB: [birth year], 57 y.o.   MRN: [id number]  HPI Pt presents to the office to recheck abscess on posterior neck. Pt was seen on  11/01/17 with an abscess. Pt was given doxycycline  and states it felt drainage, but it has stopped and feels a constant pressure. Denies any fever.    Review of Systems  Skin: Positive for wound.  All other systems reviewed and are negative.      Objective:   Physical Exam  Constitutional: He is oriented to person, place, and time. He appears well-developed and well-nourished. No distress.  HENT:  Head: Normocephalic.  Cardiovascular: Normal rate, regular rhythm, normal heart sounds and intact distal pulses.  No murmur heard. Pulmonary/Chest: Effort normal and breath sounds normal. No respiratory distress. He has no wheezes.  Abdominal: Soft. Bowel sounds are normal. He exhibits no distension. There is no tenderness.  Musculoskeletal: Normal range of motion. He exhibits no edema or tenderness.  Neurological: He is alert and oriented to person, place, and time. No cranial nerve deficit.  Skin: Skin is warm and dry. No rash noted. No erythema.  Abscess on posterior neck approx 5x4Cm  Psychiatric: He has a normal mood and affect. His behavior is normal. Judgment and thought content normal.  Vitals reviewed.    Local anesthesia Lidocaine 2% with 51ml Betadine prep Small incision made, moderate amount of serosanguinous discharge Cleaned with Saline Dressing applied   BP (!) 152/87   Pulse 78   Temp (!) 97.1 F (36.2 C) (Oral)   Ht 6' (1.829 m)   Wt (!) 393 lb 9.6 oz (178.5 kg)   BMI 53.38 kg/m      Assessment & Plan:  1. Abscess Keep clean and dry Warm compresses May continue to drain Report any erythemas, fever, or purulent discharge RTO prn     Evelina Dun, FNP

## 2017-11-19 NOTE — Patient Instructions (Signed)

## 2017-11-23 ENCOUNTER — Other Ambulatory Visit: Payer: Self-pay | Admitting: Family

## 2017-11-23 DIAGNOSIS — G8929 Other chronic pain: Secondary | ICD-10-CM

## 2017-11-23 DIAGNOSIS — M25561 Pain in right knee: Principal | ICD-10-CM

## 2017-11-23 DIAGNOSIS — M1711 Unilateral primary osteoarthritis, right knee: Secondary | ICD-10-CM

## 2017-11-26 ENCOUNTER — Ambulatory Visit: Payer: 59 | Admitting: Neurology

## 2017-12-08 ENCOUNTER — Encounter (HOSPITAL_COMMUNITY): Payer: Self-pay | Admitting: Emergency Medicine

## 2017-12-08 ENCOUNTER — Other Ambulatory Visit: Payer: Self-pay

## 2017-12-10 ENCOUNTER — Other Ambulatory Visit: Payer: Self-pay

## 2017-12-10 DIAGNOSIS — I739 Peripheral vascular disease, unspecified: Secondary | ICD-10-CM

## 2017-12-14 ENCOUNTER — Encounter (HOSPITAL_COMMUNITY): Payer: Self-pay

## 2017-12-14 ENCOUNTER — Encounter (HOSPITAL_COMMUNITY)
Admission: RE | Disposition: A | Discharge status: Home / Self Care | Payer: Self-pay | Source: Ambulatory Visit | Attending: Gastroenterology

## 2017-12-14 ENCOUNTER — Other Ambulatory Visit: Payer: Self-pay

## 2017-12-14 ENCOUNTER — Ambulatory Visit (HOSPITAL_COMMUNITY): Payer: 59 | Admitting: Anesthesiology

## 2017-12-14 ENCOUNTER — Ambulatory Visit (HOSPITAL_COMMUNITY)
Admission: RE | Admit: 2017-12-14 | Discharge: 2017-12-14 | Disposition: A | Discharge status: Home / Self Care | Payer: 59 | Source: Ambulatory Visit | Attending: Gastroenterology | Admitting: Gastroenterology

## 2017-12-14 DIAGNOSIS — D127 Benign neoplasm of rectosigmoid junction: Secondary | ICD-10-CM | POA: Diagnosis not present

## 2017-12-14 DIAGNOSIS — I10 Essential (primary) hypertension: Secondary | ICD-10-CM | POA: Insufficient documentation

## 2017-12-14 DIAGNOSIS — Q438 Other specified congenital malformations of intestine: Secondary | ICD-10-CM | POA: Insufficient documentation

## 2017-12-14 DIAGNOSIS — Z79899 Other long term (current) drug therapy: Secondary | ICD-10-CM | POA: Diagnosis not present

## 2017-12-14 DIAGNOSIS — Z791 Long term (current) use of non-steroidal anti-inflammatories (NSAID): Secondary | ICD-10-CM | POA: Diagnosis not present

## 2017-12-14 DIAGNOSIS — D123 Benign neoplasm of transverse colon: Secondary | ICD-10-CM | POA: Diagnosis not present

## 2017-12-14 DIAGNOSIS — D122 Benign neoplasm of ascending colon: Secondary | ICD-10-CM | POA: Insufficient documentation

## 2017-12-14 DIAGNOSIS — R569 Unspecified convulsions: Secondary | ICD-10-CM | POA: Diagnosis not present

## 2017-12-14 DIAGNOSIS — K648 Other hemorrhoids: Secondary | ICD-10-CM | POA: Diagnosis not present

## 2017-12-14 DIAGNOSIS — D125 Benign neoplasm of sigmoid colon: Secondary | ICD-10-CM | POA: Insufficient documentation

## 2017-12-14 DIAGNOSIS — Z7902 Long term (current) use of antithrombotics/antiplatelets: Secondary | ICD-10-CM | POA: Insufficient documentation

## 2017-12-14 DIAGNOSIS — D126 Benign neoplasm of colon, unspecified: Secondary | ICD-10-CM

## 2017-12-14 DIAGNOSIS — D124 Benign neoplasm of descending colon: Secondary | ICD-10-CM | POA: Diagnosis not present

## 2017-12-14 DIAGNOSIS — Z1211 Encounter for screening for malignant neoplasm of colon: Secondary | ICD-10-CM | POA: Insufficient documentation

## 2017-12-14 DIAGNOSIS — Z6841 Body Mass Index (BMI) 40.0 and over, adult: Secondary | ICD-10-CM | POA: Diagnosis not present

## 2017-12-14 DIAGNOSIS — F17210 Nicotine dependence, cigarettes, uncomplicated: Secondary | ICD-10-CM | POA: Insufficient documentation

## 2017-12-14 HISTORY — PX: COLONOSCOPY WITH PROPOFOL: SHX5780

## 2017-12-14 SURGERY — COLONOSCOPY WITH PROPOFOL
Anesthesia: Monitor Anesthesia Care

## 2017-12-14 MED ORDER — LABETALOL HCL 5 MG/ML IV SOLN
INTRAVENOUS | Status: DC | PRN
  Administered 2017-12-14: 5 mg via INTRAVENOUS

## 2017-12-14 MED ORDER — PROPOFOL 10 MG/ML IV BOLUS
INTRAVENOUS | Status: AC
  Filled 2017-12-14: qty 60

## 2017-12-14 MED ORDER — LACTATED RINGERS IV SOLN
INTRAVENOUS | Status: DC | PRN
  Administered 2017-12-14: 09:00:00 via INTRAVENOUS

## 2017-12-14 MED ORDER — SODIUM CHLORIDE 0.9 % IV SOLN: INTRAVENOUS | Status: DC

## 2017-12-14 MED ORDER — PROPOFOL 500 MG/50ML IV EMUL
INTRAVENOUS | Status: DC | PRN
  Administered 2017-12-14: 100 ug/kg/min via INTRAVENOUS

## 2017-12-14 SURGICAL SUPPLY — 22 items

## 2017-12-14 NOTE — Transfer of Care (Signed)
Immediate Anesthesia Transfer of Care Note  Patient: Melvin Villarreal  Procedure(s) Performed: COLONOSCOPY WITH PROPOFOL (N/A )  Patient Location: PACU  Anesthesia Type:MAC  Level of Consciousness: awake, alert , oriented and patient cooperative  Airway & Oxygen Therapy: Patient Spontanous Breathing and Patient connected to face mask oxygen  Post-op Assessment: Report given to RN, Post -op Vital signs reviewed and stable and Patient moving all extremities X 4  Post vital signs: stable  Last Vitals:  Vitals:   12/14/17 0849 12/14/17 1054  BP: (!) 210/89 (!) 147/73  Pulse: 97 84  Resp: 15 16  Temp: 36.7 C   SpO2: 92% 96%    Last Pain:  Vitals:   12/14/17 1054  TempSrc: Oral         Complications: No apparent anesthesia complications

## 2017-12-14 NOTE — Anesthesia Procedure Notes (Signed)
Procedure Name: MAC Date/Time: 12/14/2017 9:40 AM Performed by: Lissa Morales, CRNA Pre-anesthesia Checklist: Patient identified, Emergency Drugs available, Suction available, Patient being monitored and Timeout performed Patient Re-evaluated:Patient Re-evaluated prior to induction Oxygen Delivery Method: Simple face mask Placement Confirmation: positive ETCO2

## 2017-12-14 NOTE — Anesthesia Preprocedure Evaluation (Signed)
Anesthesia Evaluation  Patient identified by MRN, date of birth, ID band Patient awake    Reviewed: Allergy & Precautions, NPO status , Patient's Chart, lab work & pertinent test results  History of Anesthesia Complications Negative for: history of anesthetic complications  Airway Mallampati: II  TM Distance: >3 FB Neck ROM: Full    Dental  (+) Edentulous Upper, Edentulous Lower, Dental Advisory Given   Pulmonary Current Smoker,    Pulmonary exam normal        Cardiovascular hypertension, Pt. on medications Normal cardiovascular exam     Neuro/Psych Seizures -,  negative psych ROS   GI/Hepatic Neg liver ROS, PUD,   Endo/Other  Morbid obesity  Renal/GU negative Renal ROS  negative genitourinary   Musculoskeletal negative musculoskeletal ROS (+)   Abdominal   Peds negative pediatric ROS (+)  Hematology negative hematology ROS (+)   Anesthesia Other Findings   Reproductive/Obstetrics negative OB ROS                             Anesthesia Physical Anesthesia Plan  ASA: III  Anesthesia Plan: MAC   Post-op Pain Management:    Induction:   PONV Risk Score and Plan: 2 and Ondansetron and Propofol infusion  Airway Management Planned: Natural Airway  Additional Equipment:   Intra-op Plan:   Post-operative Plan:   Informed Consent: I have reviewed the patients History and Physical, chart, labs and discussed the procedure including the risks, benefits and alternatives for the proposed anesthesia with the patient or authorized representative who has indicated his/her understanding and acceptance.   Dental advisory given  Plan Discussed with: Anesthesiologist  Anesthesia Plan Comments:         Anesthesia Quick Evaluation

## 2017-12-14 NOTE — Interval H&P Note (Signed)
History and Physical Interval Note:  12/14/2017 9:09 AM  Melvin Villarreal  has presented today for surgery, with the diagnosis of screening colonoscopy  The various methods of treatment have been discussed with the patient and family. After consideration of risks, benefits and other options for treatment, the patient has consented to  Procedure(s): COLONOSCOPY WITH PROPOFOL (N/A) as a surgical intervention .  The patient's history has been reviewed, patient examined, no change in status, stable for surgery.  I have reviewed the patient's chart and labs.  Questions were answered to the patient's satisfaction.     [place]

## 2017-12-14 NOTE — Op Note (Signed)
[employer] Patient Name: Melvin Villarreal Procedure Date: 12/14/2017 MRN: [id number] Attending MD: Carlota Raspberry. Armbruster MD, MD Date of Birth: 08-07-1961 CSN: [id number] Age: 57 Admit Type: Outpatient Procedure:                Colonoscopy Indications:              Screening for colorectal malignant neoplasm Providers:                Remo Lipps P. Armbruster MD, MD, Cherylynn Ridges,                            Technician, Enrigue Catena, CRNA Referring MD:              Medicines:                Monitored Anesthesia Care Complications:            No immediate complications. Estimated blood loss:                            Minimal. Estimated Blood Loss:     Estimated blood loss was minimal. Procedure:                Pre-Anesthesia Assessment:                           - Prior to the procedure, a History and Physical                            was performed, and patient medications and                            allergies were reviewed. The patient's tolerance of                            previous anesthesia was also reviewed. The risks                            and benefits of the procedure and the sedation                            options and risks were discussed with the patient.                            All questions were answered, and informed consent                            was obtained. Prior Anticoagulants: The patient has                            taken Plavix (clopidogrel), last dose was 5 days                            prior to procedure. ASA Grade Assessment: III - A  patient with severe systemic disease. After                            reviewing the risks and benefits, the patient was                            deemed in satisfactory condition to undergo the                            procedure.                           After obtaining informed consent, the colonoscope                            was passed under direct vision.  Throughout the                            procedure, the patient's blood pressure, pulse, and                            oxygen saturations were monitored continuously. The                            EC-[id number] ([id number]) scope was introduced through                            the anus and advanced to the the cecum, identified                            by appendiceal orifice and ileocecal valve. The                            colonoscopy was performed without difficulty. The                            patient tolerated the procedure well. The quality                            of the bowel preparation was adequate. The                            ileocecal valve, appendiceal orifice, and rectum                            were photographed. Scope In: 9:45:52 AM Scope Out: 10:34:40 AM Scope Withdrawal Time: 0 hours 35 minutes 57 seconds  Total Procedure Duration: 0 hours 48 minutes 48 seconds  Findings:      The perianal and digital rectal examinations were normal.      A 4 mm polyp was found in the ascending colon. The polyp was sessile.       The polyp was removed with a cold snare. Resection and retrieval were       complete.      Four sessile polyps were found in  the transverse colon. The polyps were       4 to 7 mm in size. These polyps were removed with a cold snare.       Resection and retrieval were complete.      Two sessile polyps were found in the descending colon. The polyps were 5       to 7 mm in size. These polyps were removed with a cold snare. Resection       and retrieval were complete.      Four sessile polyps were found in the sigmoid colon. The polyps were 3       to 6 mm in size. These polyps were removed with a cold snare. Resection       and retrieval were complete.      A 8 mm polyp was found in the recto-sigmoid colon. The polyp was       semi-pedunculated. The polyp was removed with a hot snare. Resection and       retrieval were complete.      The left colon  was redundant.      Internal hemorrhoids were found during retroflexion.      The prep was only fair on insertion. Several minutes were spent to       lavage the colon to obtain adequate views, which prolonged the exam. The       exam was otherwise without abnormality. Impression:               - One 4 mm polyp in the ascending colon, removed                            with a cold snare. Resected and retrieved.                           - Four 4 to 7 mm polyps in the transverse colon,                            removed with a cold snare. Resected and retrieved.                           - Two 5 to 7 mm polyps in the descending colon,                            removed with a cold snare. Resected and retrieved.                           - Four 3 to 6 mm polyps in the sigmoid colon,                            removed with a cold snare. Resected and retrieved.                           - One 8 mm polyp at the recto-sigmoid colon,                            removed with a hot snare. Resected and retrieved.                           -  Redundant colon.                           - Internal hemorrhoids.                           - The examination was otherwise normal. Moderate Sedation:      No moderate sedation, case performed with MAC Recommendation:           - Patient has a contact number available for                            emergencies. The signs and symptoms of potential                            delayed complications were discussed with the                            patient. Return to normal activities tomorrow.                            Written discharge instructions were provided to the                            patient.                           - Resume previous diet.                           - Continue present medications.                           - Resume Plavix on Thursday night                           - Await pathology results.                           - Repeat  colonoscopy is recommended for                            surveillance. The colonoscopy date will be                            determined after pathology results from today's                            exam become available for review.                           - No ibuprofen, naproxen, or other non-steroidal                            anti-inflammatory drugs for 2 weeks after polyp  removal. Procedure Code(s):        --- Professional ---                           [phone], Colonoscopy, flexible; with removal of                            tumor(s), polyp(s), or other lesion(s) by snare                            technique Diagnosis Code(s):        --- Professional ---                           Z12.11, Encounter for screening for malignant                            neoplasm of colon                           D12.2, Benign neoplasm of ascending colon                           D12.7, Benign neoplasm of rectosigmoid junction                           D12.3, Benign neoplasm of transverse colon (hepatic                            flexure or splenic flexure)                           D12.4, Benign neoplasm of descending colon                           D12.5, Benign neoplasm of sigmoid colon                           K64.8, Other hemorrhoids                           Q43.8, Other specified congenital malformations of                            intestine CPT copyright 2016 [employer]. All rights reserved. The codes documented in this report are preliminary and upon coder review may  be revised to meet current compliance requirements. Remo Lipps P. Armbruster MD, MD 12/14/2017 10:43:59 AM This report has been signed electronically. Number of Addenda: 0

## 2017-12-14 NOTE — H&P (Signed)
HPI:   Melvin Villarreal is a 57 y.o. male with a history of morbid obesity, PVD on plavix, history for a screening colonoscopy. No bowel habits changes or complaints. LAst dose of plavix 5 years ago.   Past Medical History:  Diagnosis Date  . Hypertension   . Seizures ([place])   Obesity PVD  Past Surgical History:  Procedure Laterality Date  . HAND SURGERY Left    Collapsed vein    History reviewed. No pertinent family history.   Social History   Tobacco Use  . Smoking status: Current Every Day Smoker    Packs/day: 1.00    Years: 35.00    Pack years: 35.00    Types: Cigarettes  . Smokeless tobacco: Never Used  Substance Use Topics  . Alcohol use: No    Frequency: Never  . Drug use: Yes    Prior to Admission medications   Medication Sig Start Date End Date Taking? Authorizing Provider  acetaminophen (TYLENOL) 650 MG CR tablet Take 1,300 mg by mouth every 8 (eight) hours as needed for pain.   Yes [provider]  amLODipine (NORVASC) 10 MG tablet Take 1 tablet (10 mg total) by mouth daily. 11/01/17  Yes Hawks, Christy A, FNP  clopidogrel (PLAVIX) 75 MG tablet Take 75 mg by mouth daily. 07/26/17  Yes [provider]  furosemide (LASIX) 20 MG tablet Take 20 mg by mouth daily. 07/26/17  Yes [provider]  lisinopril (PRINIVIL,ZESTRIL) 40 MG tablet Take 40 mg by mouth daily. 07/26/17  Yes [provider]  meloxicam (MOBIC) 15 MG tablet TAKE 1 TABLET BY MOUTH EVERY DAY Patient taking differently: TAKE 15 MG BY MOUTH EVERY DAY 11/23/17  Yes Hawks, Christy A, FNP  Menthol, Topical Analgesic, (BIOFREEZE EX) Apply 1 application topically daily as needed (for pain).   Yes [provider]  PEG-KCl-NaCl-NaSulf-Na Asc-C (PLENVU) 140 g SOLR Take 140 g by mouth as directed. 11/18/17  Yes Burnadette Baskett, Carlota Raspberry, MD  phenytoin (DILANTIN) 100 MG ER capsule Take 700 mg by mouth at bedtime.  07/26/17  Yes [provider]  Vitamin  D, Ergocalciferol, (DRISDOL) 50000 units CAPS capsule Take 1 capsule (50,000 Units total) by mouth every 7 (seven) days. Patient taking differently: Take 50,000 Units by mouth every Tuesday.  11/02/17  Yes Hawks, Christy A, FNP  atorvastatin (LIPITOR) 20 MG tablet Take 1 tablet (20 mg total) by mouth daily. Patient not taking: Reported on 12/01/2017 11/02/17 11/02/18  Sharion Balloon, FNP    Current Facility-Administered Medications  Medication Dose Route Frequency Provider Last Rate Last Dose  . 0.9 %  sodium chloride infusion   Intravenous Continuous Emberly Tomasso, Carlota Raspberry, MD        Allergies as of 11/02/2017  . (No Known Allergies)     Review of Systems:    As per HPI, otherwise negative    Physical Exam:  Vital signs in last 24 hours: Temp:  [98 F (36.7 C)] 98 F (36.7 C) (03/05 0849) Pulse Rate:  [97] 97 (03/05 0849) Resp:  [15] 15 (03/05 0849) BP: (210)/(89) 210/89 (03/05 0849) SpO2:  [92 %] 92 % (03/05 0849) Weight:  [393 lb (178.3 kg)] 393 lb (178.3 kg) (03/05 0849)   General:   Pleasant male in NAD Lungs:  Respirations even and unlabored. Lungs clear to auscultation bilaterally.   \ Heart:  Regular rate and rhythm; no MRG Abdomen:  Soft, nondistended,  nontender. NNo appreciable masses or hepatomegaly.  Extremities:  Without edema. Neurologic:  Alert and  oriented x4;  grossly normal neurologically. Skin:  Intact without significant lesions or rashes. Psych:  Alert and cooperative. Normal affect.  Lab Results  Component Value Date   WBC 8.1 11/01/2017   HGB 14.6 11/01/2017   HCT 44.8 11/01/2017   MCV 95 11/01/2017   PLT 164 11/01/2017    Lab Results  Component Value Date   CREATININE 0.69 (L) 11/01/2017   BUN 15 11/01/2017   NA 142 11/01/2017   K 4.2 11/01/2017   CL 102 11/01/2017   CO2 30 (H) 11/01/2017    Lab Results  Component Value Date   ALT 23 11/01/2017   AST 19 11/01/2017   ALKPHOS 108 11/01/2017   BILITOT 0.5 11/01/2017      Impression  / Plan:   57 y/o male with history of PVD on plavix, morbid obesity, here for screening colonoscopy. I discussed risks / benefits of colonoscopy with him, he wants to proceed. Further recommendations pending the results.   Carlota Raspberry Quavis Klutz  12/14/2017, 9:07 AM

## 2017-12-14 NOTE — Discharge Instructions (Signed)

## 2017-12-14 NOTE — Anesthesia Postprocedure Evaluation (Signed)
Anesthesia Post Note  Patient: Melvin Villarreal  Procedure(s) Performed: COLONOSCOPY WITH PROPOFOL (N/A )     Patient location during evaluation: Endoscopy Anesthesia Type: MAC Level of consciousness: awake and alert Pain management: pain level controlled Vital Signs Assessment: post-procedure vital signs reviewed and stable Respiratory status: spontaneous breathing and respiratory function stable Cardiovascular status: stable Postop Assessment: no apparent nausea or vomiting Anesthetic complications: no    Last Vitals:  Vitals:   12/14/17 1100 12/14/17 1110  BP: (!) 161/76 (!) 162/68  Pulse: 81 79  Resp: 15 17  Temp:    SpO2: 92% 92%    Last Pain:  Vitals:   12/14/17 1054  TempSrc: Oral                 Lumir Demetriou DANIEL

## 2017-12-15 ENCOUNTER — Encounter (HOSPITAL_COMMUNITY): Payer: Self-pay | Admitting: Gastroenterology

## 2017-12-22 ENCOUNTER — Ambulatory Visit (INDEPENDENT_AMBULATORY_CARE_PROVIDER_SITE_OTHER): Payer: 59 | Admitting: Family Medicine

## 2017-12-22 ENCOUNTER — Ambulatory Visit (INDEPENDENT_AMBULATORY_CARE_PROVIDER_SITE_OTHER): Payer: 59

## 2017-12-22 ENCOUNTER — Encounter: Payer: Self-pay | Admitting: Family Medicine

## 2017-12-22 VITALS — BP 160/88 | HR 88 | Temp 97.0°F | Ht 72.0 in | Wt >= 6400 oz

## 2017-12-22 DIAGNOSIS — R609 Edema, unspecified: Secondary | ICD-10-CM

## 2017-12-22 DIAGNOSIS — R0600 Dyspnea, unspecified: Secondary | ICD-10-CM

## 2017-12-22 DIAGNOSIS — M129 Arthropathy, unspecified: Secondary | ICD-10-CM

## 2017-12-22 DIAGNOSIS — I10 Essential (primary) hypertension: Secondary | ICD-10-CM

## 2017-12-22 MED ORDER — FUROSEMIDE 80 MG PO TABS: 80.0000 mg | ORAL_TABLET | ORAL | 3 refills | Status: DC

## 2017-12-22 NOTE — Progress Notes (Signed)
Subjective:  Patient ID: Melvin Villarreal, male    DOB: 16-Jul-1961  Age: 57 y.o. MRN: [id number]  CC: Edema   HPI Melvin Villarreal presents for moderate to severe swelling of the lower legs and ankles.  Equal bilaterally.  Ongoing for 1-2 weeks and increasing.  There is moderate associateddyspnea.  Patient has chronic fatigue..  This is related to his history of seizure, although he has not had a seizure in almost 30 years.  He also has had repeatedly elevated blood pressures in the office and elsewhere.  Depression screen [facility] 2/9 12/22/2017 11/19/2017 11/01/2017  Decreased Interest 0 0 0  Down, Depressed, Hopeless 0 0 0  PHQ - 2 Score 0 0 0    History Melvin Villarreal has a past medical history of Hypertension and Seizures ([place]).   He has a past surgical history that includes Hand surgery (Left) and Colonoscopy with propofol (N/A, 12/14/2017).   His family history is not on file.He reports that he has been smoking cigarettes.  He has a 35.00 pack-year smoking history. he has never used smokeless tobacco. He reports that he uses drugs. He reports that he does not drink alcohol.    ROS Review of Systems  Constitutional: Negative for chills, diaphoresis, fever and unexpected weight change.  HENT: Negative for congestion, hearing loss, rhinorrhea and sore throat.   Eyes: Negative for visual disturbance.  Respiratory: Negative for cough and shortness of breath.   Cardiovascular: Positive for leg swelling. Negative for chest pain and palpitations.  Gastrointestinal: Negative for abdominal pain, constipation and diarrhea.  Genitourinary: Negative for dysuria and flank pain.  Musculoskeletal: Negative for arthralgias and joint swelling.  Skin: Negative for rash.  Neurological: Negative for dizziness and headaches.  Psychiatric/Behavioral: Negative for dysphoric mood and sleep disturbance.    Objective:  BP (!) 160/88   Pulse 88   Temp (!) 97 F (36.1 C) (Oral)   Ht 6' (1.829 m)   Wt (!) 417 lb  (189.1 kg)   BMI 56.56 kg/m   BP Readings from Last 3 Encounters:  12/22/17 (!) 160/88  12/14/17 (!) 162/68  11/19/17 (!) 152/87    Wt Readings from Last 3 Encounters:  12/22/17 (!) 417 lb (189.1 kg)  12/14/17 (!) 393 lb (178.3 kg)  11/19/17 (!) 393 lb 9.6 oz (178.5 kg)     Physical Exam  Constitutional: He is oriented to person, place, and time. He appears well-developed and well-nourished. No distress.  HENT:  Head: Normocephalic and atraumatic.  Right Ear: External ear normal.  Left Ear: External ear normal.  Nose: Nose normal.  Mouth/Throat: Oropharynx is clear and moist.  Eyes: Conjunctivae and EOM are normal. Pupils are equal, round, and reactive to light.  Neck: Normal range of motion. Neck supple. No thyromegaly present.  Cardiovascular: Normal rate, regular rhythm and normal heart sounds.  No murmur heard. Pulmonary/Chest: Effort normal and breath sounds normal. No respiratory distress. He has no wheezes. He has no rales.  Abdominal: Soft. Bowel sounds are normal. He exhibits no distension. There is no tenderness.  Musculoskeletal: Normal range of motion. He exhibits edema (3+ + at the ankles and to the tibial tuberosity bilaterally.  The region has deep erythema.  There is a cobblestone feel to the extremities.) and tenderness.  Lymphadenopathy:    He has no cervical adenopathy.  Neurological: He is alert and oriented to person, place, and time. He has normal reflexes.  Skin: Skin is warm and dry.  Psychiatric: He has a  normal mood and affect. His behavior is normal. Judgment and thought content normal.      Assessment & Plan:   Melvin Villarreal was seen today for edema.  Diagnoses and all orders for this visit:  Peripheral edema -     Brain natriuretic peptide -     DG Chest 2 View; Future  Morbid obesity (HCC)  Essential hypertension -     CBC with Differential/Platelet -     CMP14+EGFR -     Urinalysis  Arthropathy  Dyspnea, unspecified type -     Brain  natriuretic peptide -     DG Chest 2 View; Future  Other orders -     furosemide (LASIX) 80 MG tablet; Take 1 tablet (80 mg total) by mouth every morning.       I have discontinued Melvin Villarreal's furosemide and PEG-KCl-NaCl-NaSulf-Na Asc-C. I am also having him start on furosemide. Additionally, I am having him maintain his clopidogrel, lisinopril, phenytoin, amLODipine, atorvastatin, Vitamin D (Ergocalciferol), meloxicam, acetaminophen, and (Menthol, Topical Analgesic, (BIOFREEZE EX)).  Allergies as of 12/22/2017   No Known Allergies     Medication List        Accurate as of 12/22/17 11:59 PM. Always use your most recent med list.          acetaminophen 650 MG CR tablet Commonly known as:  TYLENOL Take 1,300 mg by mouth every 8 (eight) hours as needed for pain.   amLODipine 10 MG tablet Commonly known as:  NORVASC Take 1 tablet (10 mg total) by mouth daily.   atorvastatin 20 MG tablet Commonly known as:  LIPITOR Take 1 tablet (20 mg total) by mouth daily.   BIOFREEZE EX Apply 1 application topically daily as needed (for pain).   clopidogrel 75 MG tablet Commonly known as:  PLAVIX Take 75 mg by mouth daily.   furosemide 80 MG tablet Commonly known as:  LASIX Take 1 tablet (80 mg total) by mouth every morning.   lisinopril 40 MG tablet Commonly known as:  PRINIVIL,ZESTRIL Take 40 mg by mouth daily.   meloxicam 15 MG tablet Commonly known as:  MOBIC TAKE 1 TABLET BY MOUTH EVERY DAY   phenytoin 100 MG ER capsule Commonly known as:  DILANTIN Take 700 mg by mouth at bedtime.   Vitamin D (Ergocalciferol) 50000 units Caps capsule Commonly known as:  DRISDOL Take 1 capsule (50,000 Units total) by mouth every 7 (seven) days.      Will diurese the legs and check for heart failure as well as renal insufficiency and peripheral vascular disease.  He has been previously referred to vascular surgery and that visit is scheduled for March 20.  Follow-up: No  Follow-up on file.  Claretta Fraise, M.D.

## 2017-12-22 NOTE — Patient Instructions (Signed)
Elevate legs higher than heart

## 2017-12-23 LAB — CMP14+EGFR
A/G RATIO: 1.6 (ref 1.2–2.2)
ALBUMIN: 4.2 g/dL (ref 3.5–5.5)
ALT: 18 IU/L (ref 0–44)
AST: 17 IU/L (ref 0–40)
Alkaline Phosphatase: 150 IU/L — ABNORMAL HIGH (ref 39–117)
BUN / CREAT RATIO: 19 (ref 9–20)
BUN: 11 mg/dL (ref 6–24)
Bilirubin Total: 0.6 mg/dL (ref 0.0–1.2)
CALCIUM: 9.3 mg/dL (ref 8.7–10.2)
CO2: 26 mmol/L (ref 20–29)
CREATININE: 0.59 mg/dL — AB (ref 0.76–1.27)
Chloride: 103 mmol/L (ref 96–106)
GFR calc Af Amer: 133 mL/min/{1.73_m2} (ref >59)
GFR, EST NON AFRICAN AMERICAN: 115 mL/min/{1.73_m2} (ref >59)
GLOBULIN, TOTAL: 2.7 g/dL (ref 1.5–4.5)
Glucose: 90 mg/dL (ref 65–99)
POTASSIUM: 4.7 mmol/L (ref 3.5–5.2)
SODIUM: 146 mmol/L — AB (ref 134–144)
TOTAL PROTEIN: 6.9 g/dL (ref 6.0–8.5)

## 2017-12-23 LAB — CBC WITH DIFFERENTIAL/PLATELET
BASOS: 0 %
Basophils Absolute: 0 10*3/uL (ref 0.0–0.2)
EOS (ABSOLUTE): 0.1 10*3/uL (ref 0.0–0.4)
EOS: 1 %
HEMATOCRIT: 44 % (ref 37.5–51.0)
HEMOGLOBIN: 13.7 g/dL (ref 13.0–17.7)
IMMATURE GRANS (ABS): 0 10*3/uL (ref 0.0–0.1)
IMMATURE GRANULOCYTES: 0 %
Lymphocytes Absolute: 1.2 10*3/uL (ref 0.7–3.1)
Lymphs: 15 %
MCH: 29.5 pg (ref 26.6–33.0)
MCHC: 31.1 g/dL — ABNORMAL LOW (ref 31.5–35.7)
MCV: 95 fL (ref 79–97)
MONOS ABS: 0.5 10*3/uL (ref 0.1–0.9)
Monocytes: 6 %
NEUTROS PCT: 78 %
Neutrophils Absolute: 6.3 10*3/uL (ref 1.4–7.0)
Platelets: 154 10*3/uL (ref 150–379)
RBC: 4.64 x10E6/uL (ref 4.14–5.80)
RDW: 14.3 % (ref 12.3–15.4)
WBC: 8.1 10*3/uL (ref 3.4–10.8)

## 2017-12-23 LAB — BRAIN NATRIURETIC PEPTIDE: BNP: 189.2 pg/mL — ABNORMAL HIGH (ref 0.0–100.0)

## 2017-12-24 NOTE — Progress Notes (Signed)
Your chest x-ray looked normal. Thanks, WS.

## 2017-12-25 ENCOUNTER — Encounter: Payer: Self-pay | Admitting: Family Medicine

## 2017-12-27 ENCOUNTER — Encounter: Payer: Self-pay | Admitting: Family Medicine

## 2017-12-27 NOTE — Telephone Encounter (Signed)
Patient aware it is recommended he be seen.  Dr Livia Snellen does not have any available appointments tomorrow, Dr. Warrick Parisian is off tomorrow, so patient scheduled with on call provider, Dr. Wendi Snipes tomorrow at 9:25 am.

## 2017-12-27 NOTE — Telephone Encounter (Signed)
Sounds like patient needs to come back in for an office visit because in the note from Dr. Livia Snellen it seems like it was just legs and not groin swelling.

## 2017-12-27 NOTE — Telephone Encounter (Signed)
Swelling in legs and groin has gotten worse it is making it difficult to use the bathroom.

## 2017-12-28 ENCOUNTER — Ambulatory Visit (INDEPENDENT_AMBULATORY_CARE_PROVIDER_SITE_OTHER): Payer: 59 | Admitting: Family Medicine

## 2017-12-28 ENCOUNTER — Encounter: Payer: Self-pay | Admitting: Family Medicine

## 2017-12-28 VITALS — BP 155/77 | HR 96 | Temp 97.1°F | Ht 72.0 in | Wt >= 6400 oz

## 2017-12-28 DIAGNOSIS — R0600 Dyspnea, unspecified: Secondary | ICD-10-CM | POA: Diagnosis not present

## 2017-12-28 DIAGNOSIS — R601 Generalized edema: Secondary | ICD-10-CM

## 2017-12-28 NOTE — Patient Instructions (Addendum)
Great to meet you!  Start lasix 3 times daily ( every 6 hours while awake) for 3 days.   Come back on Friday for recheck with your PCP

## 2017-12-28 NOTE — Progress Notes (Signed)
   HPI  Patient presents today here with swelling shortness of breath.  Patient states that he has had swelling that slowly been worsening over the last several weeks.  He has swelling that is extreme in his scrotum and bilateral lower extremities.  He has very good urine output with 80 mg of Lasix which was started 4 days ago.  Patient recently had normal renal function and elevated BNP to 189.  PMH: Smoking status noted ROS: Per HPI  Objective: BP (!) 155/77   Pulse 96   Temp (!) 97.1 F (36.2 C) (Oral)   Ht 6' (1.829 m)   Wt (!) 416 lb 9.6 oz (189 kg)   BMI 56.50 kg/m  Gen: NAD, alert, cooperative with exam HEENT: NCAT CV: RRR, good S1/S2, no murmur Resp: CTABL, no wheezes, non-labored Ext: severe edema, firm non pitting, weeping on the R Neuro: Alert and oriented, No gross deficits GU: Inverted penis due to scrotal edema, grapefruit sized scrotum with tense skin.    Assessment and plan:  #Dyspnea, anasarca Patient with likely new onset CHF EKG shows: wide QRS, no acute changes- no No CP or current concerning syms. Reviewed with cardiology over the phone Stat echocardiogram today Recommended Lasix every 6 hours 3 times daily while out of work for the next week. Follow-up with PCP in 3 days.      Orders Placed This Encounter  Procedures  . EKG 12-Lead  . ECHOCARDIOGRAM COMPLETE    Standing Status:   Future    Standing Expiration Date:   03/31/2019    Order Specific Question:   Where should this test be performed    Answer:   [facility]    Order Specific Question:   Perflutren DEFINITY (image enhancing agent) should be administered unless hypersensitivity or allergy exist    Answer:   Administer Perflutren    Order Specific Question:   Expected Date:    Answer:   ASAP    Laroy Apple, MD [facility] 12/28/2017, 9:58 AM

## 2017-12-29 ENCOUNTER — Ambulatory Visit (HOSPITAL_COMMUNITY)
Admission: RE | Admit: 2017-12-29 | Discharge: 2017-12-29 | Disposition: A | Discharge status: Home / Self Care | Payer: 59 | Source: Ambulatory Visit | Attending: Family Medicine | Admitting: Family Medicine

## 2017-12-29 ENCOUNTER — Ambulatory Visit (HOSPITAL_BASED_OUTPATIENT_CLINIC_OR_DEPARTMENT_OTHER)
Admission: RE | Admit: 2017-12-29 | Discharge: 2017-12-29 | Disposition: A | Discharge status: Home / Self Care | Payer: 59 | Source: Ambulatory Visit | Attending: Family Medicine | Admitting: Family Medicine

## 2017-12-29 ENCOUNTER — Encounter: Payer: Self-pay | Admitting: Vascular Surgery

## 2017-12-29 ENCOUNTER — Ambulatory Visit (INDEPENDENT_AMBULATORY_CARE_PROVIDER_SITE_OTHER): Payer: 59 | Admitting: Vascular Surgery

## 2017-12-29 VITALS — BP 147/77 | HR 91 | Temp 97.1°F | Resp 20 | Ht 72.0 in | Wt >= 6400 oz

## 2017-12-29 DIAGNOSIS — I739 Peripheral vascular disease, unspecified: Secondary | ICD-10-CM

## 2017-12-29 DIAGNOSIS — E785 Hyperlipidemia, unspecified: Secondary | ICD-10-CM | POA: Insufficient documentation

## 2017-12-29 DIAGNOSIS — I10 Essential (primary) hypertension: Secondary | ICD-10-CM | POA: Diagnosis not present

## 2017-12-29 DIAGNOSIS — R0989 Other specified symptoms and signs involving the circulatory and respiratory systems: Secondary | ICD-10-CM | POA: Diagnosis not present

## 2017-12-29 DIAGNOSIS — R600 Localized edema: Secondary | ICD-10-CM | POA: Diagnosis not present

## 2017-12-29 DIAGNOSIS — I071 Rheumatic tricuspid insufficiency: Secondary | ICD-10-CM | POA: Diagnosis not present

## 2017-12-29 DIAGNOSIS — R0600 Dyspnea, unspecified: Secondary | ICD-10-CM

## 2017-12-29 DIAGNOSIS — I371 Nonrheumatic pulmonary valve insufficiency: Secondary | ICD-10-CM | POA: Diagnosis not present

## 2017-12-29 DIAGNOSIS — R601 Generalized edema: Secondary | ICD-10-CM | POA: Insufficient documentation

## 2017-12-29 DIAGNOSIS — I872 Venous insufficiency (chronic) (peripheral): Secondary | ICD-10-CM | POA: Diagnosis not present

## 2017-12-29 DIAGNOSIS — Z720 Tobacco use: Secondary | ICD-10-CM | POA: Insufficient documentation

## 2017-12-29 DIAGNOSIS — Z86010 Personal history of colonic polyps: Secondary | ICD-10-CM | POA: Diagnosis not present

## 2017-12-29 NOTE — Progress Notes (Signed)
Patient name: Melvin Villarreal MRN: [id number] DOB: August 01, 1961 Sex: male  REASON FOR CONSULT:    Peripheral vascular disease.  The consult is requested by Evelina Dun, FNP.  HPI:   Melvin Villarreal is a pleasant 57 y.o. male, who was referred for evaluation of leg swelling and discoloration of both legs.  This patient has had a long history of bilateral lower extremity leg swelling.  However this is been especially worse over the last several months.  He is unaware of any history of DVT.  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  In addition, he is being worked up for evidence of congestive heart failure.  He complains of significant scrotal swelling in addition.  I do not get any history of claudication, rest pain, or nonhealing ulcers.  He also complains of bilateral knee pain.  I reviewed the notes from the referring office.  The patient was seen on 11/19/2017.  This was for a follow-up visit concerning an abscess in the posterior neck.   His risk factors for peripheral vascular disease include hypertension, hypercholesterolemia, a family history of premature cardiovascular disease and heavy tobacco use.  He smokes 1-1/2 packs/day.  Past Medical History:  Diagnosis Date  . Hypertension   . Seizures ([place])     Family History  Problem Relation Age of Onset  . Heart disease Brother     SOCIAL HISTORY: Social History   Socioeconomic History  . Marital status: Single    Spouse name: Not on file  . Number of children: Not on file  . Years of education: Not on file  . Highest education level: Not on file  Social Needs  . Financial resource strain: Not on file  . Food insecurity - worry: Not on file  . Food insecurity - inability: Not on file  . Transportation needs - medical: Not on file  . Transportation needs - non-medical: Not on file  Occupational History  . Not on file  Tobacco Use  . Smoking status: Current Every Day Smoker    Packs/day: 1.50   Years: 35.00    Pack years: 52.50    Types: Cigarettes  . Smokeless tobacco: Never Used  Substance and Sexual Activity  . Alcohol use: No    Frequency: Never  . Drug use: No  . Sexual activity: Not on file  Other Topics Concern  . Not on file  Social History Narrative  . Not on file    No Known Allergies  Current Outpatient Medications  Medication Sig Dispense Refill  . acetaminophen (TYLENOL) 650 MG CR tablet Take 1,300 mg by mouth every 8 (eight) hours as needed for pain.    [name] amLODipine (NORVASC) 10 MG tablet Take 1 tablet (10 mg total) by mouth daily. 90 tablet 3  . clopidogrel (PLAVIX) 75 MG tablet Take 75 mg by mouth daily.  3  . furosemide (LASIX) 80 MG tablet Take 1 tablet (80 mg total) by mouth every morning. 30 tablet 3  . lisinopril (PRINIVIL,ZESTRIL) 40 MG tablet Take 40 mg by mouth daily.    . meloxicam (MOBIC) 15 MG tablet TAKE 1 TABLET BY MOUTH EVERY DAY (Patient taking differently: TAKE 15 MG BY MOUTH EVERY DAY) 30 tablet 2  . Menthol, Topical Analgesic, (BIOFREEZE EX) Apply 1 application topically daily as needed (for pain).    . phenytoin (DILANTIN) 100 MG ER capsule Take 700 mg by mouth at bedtime.   0  . Vitamin D,  Ergocalciferol, (DRISDOL) 50000 units CAPS capsule Take 1 capsule (50,000 Units total) by mouth every 7 (seven) days. (Patient taking differently: Take 50,000 Units by mouth every Tuesday. ) 12 capsule 3   No current facility-administered medications for this visit.     REVIEW OF SYSTEMS:  [X]  denotes positive finding, [ ]  denotes negative finding Cardiac  Comments:  Chest pain or chest pressure:    Shortness of breath upon exertion:    Short of breath when lying flat:    Irregular heart rhythm:        Vascular    Pain in calf, thigh, or hip brought on by ambulation:    Pain in feet at night that wakes you up from your sleep:     Blood clot in your veins:    Leg swelling:         Pulmonary    Oxygen at home:    Productive cough:       Wheezing:         Neurologic    Sudden weakness in arms or legs:     Sudden numbness in arms or legs:     Sudden onset of difficulty speaking or slurred speech:    Temporary loss of vision in one eye:     Problems with dizziness:         Gastrointestinal    Blood in stool:     Vomited blood:         Genitourinary    Burning when urinating:     Blood in urine:        Psychiatric    Major depression:         Hematologic    Bleeding problems:    Problems with blood clotting too easily:        Skin    Rashes or ulcers:        Constitutional    Fever or chills:     PHYSICAL EXAM:   Vitals:   12/29/17 1222  BP: (!) 147/77  Pulse: 91  Resp: 20  Temp: (!) 97.1 F (36.2 C)  TempSrc: Oral  SpO2: (!) 88%  Weight: (!) 414 lb (187.8 kg)  Height: 6' (1.829 m)    GENERAL: The patient is a well-nourished male, in no acute distress. The vital signs are documented above. CARDIAC: There is a regular rate and rhythm.  VASCULAR: He has a left carotid bruit. He has palpable femoral pulses.  I cannot palpate popliteal or pedal pulses.  However, he has moderate to severe bilateral lower extremity swelling which makes pulse assessment very difficult. He does have hyperpigmentation bilaterally consistent with chronic venous insufficiency. PULMONARY: There is good air exchange bilaterally without wheezing or rales. ABDOMEN: Soft and non-tender with normal pitched bowel sounds.  MUSCULOSKELETAL: There are no major deformities or cyanosis. NEUROLOGIC: No focal weakness or paresthesias are detected. SKIN: There are no ulcers or rashes noted. PSYCHIATRIC: The patient has a normal affect.  DATA:    LOWER EXTREMITY ARTERIAL DOPPLER STUDY: I have independently interpreted the lower extremity arterial Doppler study today.  On the right side, there is a monophasic dorsalis pedis and posterior tibial signal.  ABI is 52%.  On the left side, there is a biphasic posterior tibial signal and a  monophasic dorsalis pedis signal.  ABI on the left is 63%.  ECHO: The patient had an echo today.  This shows diffuse hypokinesis of the left ventricle with abnormal septal motion.  Wall thickness was increased  in a pattern of moderate left ventricular hypertrophy.  Systolic function was mildly reduced.  Ejection fraction was in the range of 40-45%.  MEDICAL ISSUES:   INFRAINGUINAL ARTERIAL OCCLUSIVE DISEASE: Based on his Doppler study he appears to have infrainguinal arterial occlusive disease bilaterally.  However this is asymptomatic.  We discussed several issues with respect to this.  I have encouraged him to try to stay as active as possible and to get on a structured walking program.  I have emphasized with him the importance of getting off tobacco completely.  We have also had a long discussion about the importance of nutrition.  I have recommended essentially a plant-based diet with minimal processed food.  CHRONIC VENOUS INSUFFICIENCY: Based on his exam he clearly has evidence of chronic venous insufficiency.  I discussed the importance of intermittent leg elevation and the proper positioning for this.  If he experiences any rest pain with leg elevation then we might have to consider arteriography to further evaluate his arterial insufficiency however currently I think we have to wait till his swelling is under better control and his heart has been worked up.  He did have an echo which showed several issues.  We have also discussed the importance of weight loss as abdominal obesity especially increases venous hypertension.  I have encouraged him to stay as active as possible and to avoid prolonged sitting and standing.  We also discussed water aerobics which is very helpful for patients with chronic venous insufficiency.  LEFT CAROTID BRUIT: He does have a left carotid bruit.  We will have to schedule him for a duplex to rule out a carotid stenosis.  He is on Plavix.  He does not tolerate statins.     Deitra Mayo [facility] [phone]

## 2017-12-29 NOTE — Progress Notes (Signed)
*  PRELIMINARY RESULTS* Echocardiogram 2D Echocardiogram has been performed.  Melvin Villarreal 12/29/2017, 9:29 AM

## 2017-12-30 ENCOUNTER — Other Ambulatory Visit: Payer: Self-pay

## 2017-12-30 ENCOUNTER — Other Ambulatory Visit: Payer: Self-pay | Admitting: *Deleted

## 2017-12-30 DIAGNOSIS — R931 Abnormal findings on diagnostic imaging of heart and coronary circulation: Secondary | ICD-10-CM

## 2017-12-30 DIAGNOSIS — R609 Edema, unspecified: Secondary | ICD-10-CM

## 2017-12-30 DIAGNOSIS — R0989 Other specified symptoms and signs involving the circulatory and respiratory systems: Secondary | ICD-10-CM

## 2017-12-31 ENCOUNTER — Encounter: Payer: Self-pay | Admitting: Family

## 2017-12-31 ENCOUNTER — Ambulatory Visit (INDEPENDENT_AMBULATORY_CARE_PROVIDER_SITE_OTHER): Payer: 59 | Admitting: Family

## 2017-12-31 VITALS — BP 141/84 | HR 86 | Temp 97.3°F | Ht 72.0 in | Wt >= 6400 oz

## 2017-12-31 DIAGNOSIS — F17210 Nicotine dependence, cigarettes, uncomplicated: Secondary | ICD-10-CM

## 2017-12-31 DIAGNOSIS — N5089 Other specified disorders of the male genital organs: Secondary | ICD-10-CM | POA: Diagnosis not present

## 2017-12-31 DIAGNOSIS — R609 Edema, unspecified: Secondary | ICD-10-CM | POA: Diagnosis not present

## 2017-12-31 DIAGNOSIS — R600 Localized edema: Secondary | ICD-10-CM

## 2017-12-31 DIAGNOSIS — I509 Heart failure, unspecified: Secondary | ICD-10-CM | POA: Diagnosis not present

## 2017-12-31 DIAGNOSIS — F17200 Nicotine dependence, unspecified, uncomplicated: Secondary | ICD-10-CM

## 2017-12-31 DIAGNOSIS — I10 Essential (primary) hypertension: Secondary | ICD-10-CM | POA: Diagnosis not present

## 2017-12-31 NOTE — Patient Instructions (Signed)
Edema Edema is an abnormal buildup of fluids in your bodytissues. Edema is somewhatdependent on gravity to pull the fluid to the lowest place in your body. That makes the condition more common in the legs and thighs (lower extremities). Painless swelling of the feet and ankles is common and becomes more likely as you get older. It is also common in looser tissues, like around your eyes. When the affected area is squeezed, the fluid may move out of that spot and leave a dent for a few moments. This dent is called pitting. What are the causes? There are many possible causes of edema. Eating too much salt and being on your feet or sitting for a long time can cause edema in your legs and ankles. Hot weather may make edema worse. Common medical causes of edema include:  Heart failure.  Liver disease.  Kidney disease.  Weak blood vessels in your legs.  Cancer.  An injury.  Pregnancy.  Some medications.  Obesity.  What are the signs or symptoms? Edema is usually painless.Your skin may look swollen or shiny. How is this diagnosed? Your health care provider may be able to diagnose edema by asking about your medical history and doing a physical exam. You may need to have tests such as X-rays, an electrocardiogram, or blood tests to check for medical conditions that may cause edema. How is this treated? Edema treatment depends on the cause. If you have heart, liver, or kidney disease, you need the treatment appropriate for these conditions. General treatment may include:  Elevation of the affected body part above the level of your heart.  Compression of the affected body part. Pressure from elastic bandages or support stockings squeezes the tissues and forces fluid back into the blood vessels. This keeps fluid from entering the tissues.  Restriction of fluid and salt intake.  Use of a water pill (diuretic). These medications are appropriate only for some types of edema. They pull fluid  out of your body and make you urinate more often. This gets rid of fluid and reduces swelling, but diuretics can have side effects. Only use diuretics as directed by your health care provider.  Follow these instructions at home:  Keep the affected body part above the level of your heart when you are lying down.  Do not sit still or stand for prolonged periods.  Do not put anything directly under your knees when lying down.  Do not wear constricting clothing or garters on your upper legs.  Exercise your legs to work the fluid back into your blood vessels. This may help the swelling go down.  Wear elastic bandages or support stockings to reduce ankle swelling as directed by your health care provider.  Eat a low-salt diet to reduce fluid if your health care provider recommends it.  Only take medicines as directed by your health care provider. Contact a health care provider if:  Your edema is not responding to treatment.  You have heart, liver, or kidney disease and notice symptoms of edema.  You have edema in your legs that does not improve after elevating them.  You have sudden and unexplained weight gain. Get help right away if:  You develop shortness of breath or chest pain.  You cannot breathe when you lie down.  You develop pain, redness, or warmth in the swollen areas.  You have heart, liver, or kidney disease and suddenly get edema.  You have a fever and your symptoms suddenly get worse. This information is   not intended to replace advice given to you by your health care provider. Make sure you discuss any questions you have with your health care provider. Document Released: 09/28/2005 Document Revised: 03/05/2016 Document Reviewed: 07/21/2013 Elsevier Interactive Patient Education  2017 [employer] Inc[employer]  

## 2017-12-31 NOTE — Progress Notes (Signed)
   Subjective:    Patient ID: Melvin Villarreal, male    DOB: 04/02/1961, 57 y.o.   MRN: [id number]  HPI PT presents to the office today to recheck SOB and edema. PT was seen on 12/28/17 with increased SOB and increased swelling. At that visit his lasix '80mg'$  was increased to  3 times daily for three days. He now is taking Lasix 80 mg daily.   Reports a  3 lb weight lost. He has seen Vascular on 12/29/17. PT has referral pending for Cardiologists. Pt had a decrease EF and is scheduled for duplex to rule out carotid stenosis.   Pt reports his SOB has slightly improved, but states he continues to have moderate swelling in his testes.   Review of Systems  Respiratory: Positive for shortness of breath.   Cardiovascular: Positive for leg swelling.  All other systems reviewed and are negative.      Objective:   Physical Exam  Constitutional: He is oriented to person, place, and time. He appears well-developed and well-nourished. No distress.  Morbid obese  HENT:  Head: Normocephalic.  Eyes: Pupils are equal, round, and reactive to light. Right eye exhibits no discharge. Left eye exhibits no discharge.  Neck: Normal range of motion. Neck supple. No thyromegaly present.  Cardiovascular: Normal rate, regular rhythm, normal heart sounds and intact distal pulses.  No murmur heard. Pulmonary/Chest: Effort normal and breath sounds normal. No respiratory distress. He has no wheezes.  Abdominal: Soft. Bowel sounds are normal. He exhibits no distension. There is no tenderness.  Musculoskeletal: Normal range of motion. He exhibits edema (BLE). He exhibits no tenderness.  Hyperpigmentation of bilateral lower extremities   Neurological: He is alert and oriented to person, place, and time.  Skin: Skin is warm and dry. No rash noted. No erythema.  Psychiatric: He has a normal mood and affect. His behavior is normal. Judgment and thought content normal.  Vitals reviewed.     BP (!) 141/84   Pulse 86    Temp (!) 97.3 F (36.3 C) (Oral)   Ht 6' (1.829 m)   Wt (!) 413 lb 3.2 oz (187.4 kg)   BMI 56.04 kg/m      Assessment & Plan:  1. Essential hypertension - BMP8+EGFR  2. Current smoker - BMP8+EGFR  3. Peripheral edema - BMP8+EGFR  4. Scrotal edema  - BMP8+EGFR  5. Morbid obesity (HCC) - BMP8+EGFR  6. Heavy cigarette smoker (20-39 per day) - BMP8+EGFR  7. Congestive heart failure, unspecified HF chronicity, unspecified heart failure type ([place]) - BMP8+EGFR  Will continue Lasix 80 mg TID Cardiologist appt made for 01/07/18 BMP pending Discussed smoking cessation Weighing daily Strict low salt diet and trying to exercise Keep legs elevated  RTO in 1 month   Evelina Dun, FNP

## 2018-01-01 LAB — BMP8+EGFR
BUN/Creatinine Ratio: 24 — ABNORMAL HIGH (ref 9–20)
BUN: 16 mg/dL (ref 6–24)
CHLORIDE: 101 mmol/L (ref 96–106)
CO2: 27 mmol/L (ref 20–29)
Calcium: 9.2 mg/dL (ref 8.7–10.2)
Creatinine, Ser: 0.68 mg/dL — ABNORMAL LOW (ref 0.76–1.27)
GFR calc Af Amer: 124 mL/min/{1.73_m2} (ref >59)
GFR calc non Af Amer: 108 mL/min/{1.73_m2} (ref >59)
GLUCOSE: 91 mg/dL (ref 65–99)
POTASSIUM: 4.7 mmol/L (ref 3.5–5.2)
SODIUM: 146 mmol/L — AB (ref 134–144)

## 2018-01-04 ENCOUNTER — Ambulatory Visit (INDEPENDENT_AMBULATORY_CARE_PROVIDER_SITE_OTHER): Payer: 59 | Admitting: Family Medicine

## 2018-01-04 ENCOUNTER — Encounter: Payer: Self-pay | Admitting: Family Medicine

## 2018-01-04 VITALS — BP 143/77 | HR 94 | Temp 97.4°F | Ht 72.0 in | Wt >= 6400 oz

## 2018-01-04 DIAGNOSIS — N5089 Other specified disorders of the male genital organs: Secondary | ICD-10-CM

## 2018-01-04 DIAGNOSIS — R931 Abnormal findings on diagnostic imaging of heart and coronary circulation: Secondary | ICD-10-CM | POA: Diagnosis not present

## 2018-01-04 DIAGNOSIS — R609 Edema, unspecified: Secondary | ICD-10-CM | POA: Diagnosis not present

## 2018-01-04 DIAGNOSIS — I509 Heart failure, unspecified: Secondary | ICD-10-CM

## 2018-01-04 MED ORDER — LINACLOTIDE 290 MCG PO CAPS: 290.0000 ug | ORAL_CAPSULE | Freq: Every day | ORAL | 0 refills | Status: DC

## 2018-01-04 MED ORDER — FUROSEMIDE 80 MG PO TABS: 160.0000 mg | ORAL_TABLET | ORAL | 0 refills | Status: DC

## 2018-01-04 NOTE — Progress Notes (Signed)
Subjective:  Patient ID: Melvin Villarreal, male    DOB: November 01, 1960  Age: 57 y.o. MRN: [id number]  CC: Follow-up (pt here today following up on his edema, he has appt with Cardio Friday 3/29 )   HPI Melvin Villarreal presents for follow-up on his edema.  He had an echocardiogram last week showing mildly diminished left ventricular ejection fraction.  Additionally he had a mild elevation of his B peptide indicating that there is an element of congestive heart failure.  In spite of this he is not experiencing excessive shortness of breath or fatigue.  He denies chest pain.  He is currently taking Lasix 80 mg 3 times daily and states that although this is helping the legs somewhat the scrotum remains swollen.  It is like hauling around a 20 pound bowling ball between his legs.  It is not particularly painful just uncomfortable due to the mechanical effect.  He has an appointment with cardiology coming up in 3 days as well as vascular to evaluate his legs.   Depression screen [facility] 2/9 12/31/2017 12/28/2017 12/22/2017  Decreased Interest 0 0 0  Down, Depressed, Hopeless 0 0 0  PHQ - 2 Score 0 0 0    History Melvin Villarreal has a past medical history of Hypertension and Seizures ([place]).   He has a past surgical history that includes Hand surgery (Left) and Colonoscopy with propofol (N/A, 12/14/2017).   His family history includes Heart disease in his brother.He reports that he has been smoking cigarettes.  He has a 52.50 pack-year smoking history. He has never used smokeless tobacco. He reports that he does not drink alcohol or use drugs.    ROS Review of Systems  Constitutional: Negative for chills, diaphoresis, fever and unexpected weight change.  HENT: Negative for congestion, hearing loss, rhinorrhea and sore throat.   Eyes: Negative for visual disturbance.  Respiratory: Negative for cough, shortness of breath and wheezing.   Cardiovascular: Positive for leg swelling. Negative for chest pain.    Gastrointestinal: Positive for constipation. Negative for abdominal pain and diarrhea.  Genitourinary: Positive for scrotal swelling. Negative for dysuria and flank pain.  Musculoskeletal: Negative for arthralgias and joint swelling.  Skin: Negative for rash.  Neurological: Negative for dizziness and headaches.  Psychiatric/Behavioral: Negative for dysphoric mood and sleep disturbance.    Objective:  BP (!) 143/77   Pulse 94   Temp (!) 97.4 F (36.3 C) (Oral)   Ht 6' (1.829 m)   Wt (!) 416 lb 8 oz (188.9 kg)   BMI 56.49 kg/m   BP Readings from Last 3 Encounters:  01/04/18 (!) 143/77  12/31/17 (!) 141/84  12/29/17 (!) 147/77    Wt Readings from Last 3 Encounters:  01/04/18 (!) 416 lb 8 oz (188.9 kg)  12/31/17 (!) 413 lb 3.2 oz (187.4 kg)  12/29/17 (!) 414 lb (187.8 kg)     Physical Exam  Constitutional: He is oriented to person, place, and time. He appears well-developed and well-nourished. No distress.  HENT:  Head: Normocephalic and atraumatic.  Right Ear: External ear normal.  Left Ear: External ear normal.  Nose: Nose normal.  Mouth/Throat: Oropharynx is clear and moist.  Eyes: Pupils are equal, round, and reactive to light. Conjunctivae and EOM are normal.  Neck: Normal range of motion. Neck supple. No thyromegaly present.  Cardiovascular: Normal rate, regular rhythm and normal heart sounds.  No murmur heard. Pulmonary/Chest: Effort normal and breath sounds normal. No respiratory distress. He has no wheezes. He  has no rales.  Abdominal: Soft. Bowel sounds are normal. He exhibits no distension. There is no tenderness. Hernia confirmed negative in the right inguinal area and confirmed negative in the left inguinal area.  Genitourinary: Right testis shows swelling. Left testis shows swelling (Massive edema has swollen the scrotum to the point of no rugated being apparent.  It is approximately the size of a grapefruit possibly larger.  It does transilluminate.  It is  nontender.).  Lymphadenopathy:    He has no cervical adenopathy.       Right: No inguinal adenopathy present.       Left: No inguinal adenopathy present.  Neurological: He is alert and oriented to person, place, and time. He has normal reflexes.  Skin: Skin is warm and dry.  Psychiatric: He has a normal mood and affect. His behavior is normal. Judgment and thought content normal.      Assessment & Plan:   Melvin Villarreal was seen today for follow-up.  Diagnoses and all orders for this visit:  Severe edema  Morbid obesity ([place])  Congestive heart failure, unspecified HF chronicity, unspecified heart failure type ([place])  Abnormal echocardiogram  Scrotal edema  Other orders -     linaclotide (LINZESS) 290 MCG CAPS capsule; Take 1 capsule (290 mcg total) by mouth daily before breakfast. -     furosemide (LASIX) 80 MG tablet; Take 2 tablets (160 mg total) by mouth every morning. And take 2 more tablets at 2pm       I have discontinued [name]'s (Menthol, Topical Analgesic, (BIOFREEZE EX)). I have also changed his furosemide. Additionally, I am having him start on linaclotide. Lastly, I am having him maintain his clopidogrel, lisinopril, phenytoin, amLODipine, Vitamin D (Ergocalciferol), meloxicam, and acetaminophen.  Allergies as of 01/04/2018   No Known Allergies     Medication List        Accurate as of 01/04/18  6:07 PM. Always use your most recent med list.          acetaminophen 650 MG CR tablet Commonly known as:  TYLENOL Take 1,300 mg by mouth every 8 (eight) hours as needed for pain.   amLODipine 10 MG tablet Commonly known as:  NORVASC Take 1 tablet (10 mg total) by mouth daily.   clopidogrel 75 MG tablet Commonly known as:  PLAVIX Take 75 mg by mouth daily.   furosemide 80 MG tablet Commonly known as:  LASIX Take 2 tablets (160 mg total) by mouth every morning. And take 2 more tablets at 2pm   linaclotide 290 MCG Caps capsule Commonly known as:   LINZESS Take 1 capsule (290 mcg total) by mouth daily before breakfast.   lisinopril 40 MG tablet Commonly known as:  PRINIVIL,ZESTRIL Take 40 mg by mouth daily.   meloxicam 15 MG tablet Commonly known as:  MOBIC TAKE 1 TABLET BY MOUTH EVERY DAY   phenytoin 100 MG ER capsule Commonly known as:  DILANTIN Take 700 mg by mouth at bedtime.   Vitamin D (Ergocalciferol) 50000 units Caps capsule Commonly known as:  DRISDOL Take 1 capsule (50,000 Units total) by mouth every 7 (seven) days.        Follow-up: Return in about 10 days (around 01/14/2018).  Claretta Fraise, M.D.

## 2018-01-06 NOTE — Progress Notes (Signed)
Cardiology Office Note   Date:  01/07/2018   ID:  Melvin Villarreal, DOB 09-Jan-1961, MRN [id number]  PCP:  Melvin Fraise, MD  Cardiologist:   No primary care provider on file. Referring:  Melvin Fraise, MD  Chief Complaint  Patient presents with  . Edema      History of Present Illness: Melvin Villarreal is a 57 y.o. male who is referred by Melvin Fraise, MD for evaluation of edema.  The patient has no past cardiac history.  He was on Lasix for years because he had some scrotal swelling in the past.  Since Christmas he is gained probably 45 pounds.  He is seeing a new primary provider and he has had his Lasix increased to 80 mg a day and now 160 mg twice daily.  He thinks he is lost some fluid but he still has massive edema.  Today while taking a shower he nicked with his fingernail his scrotum and he has had continued bleeding from that.  He has increasing shortness of breath walking short to moderate distance on level ground.  He is not describing PND or orthopnea.  Is not having any chest pressure, neck or arm discomfort.  He did have an echo last week with an EF of 40 - 45%.  He had moderate RV dilatation and some elevated pulmonary pressures.  He had mild pulmonary pressures.  This was done to evaluate swelling and SOB.  Of note the patient does have snoring but is never been diagnosed with sleep apnea.  Past Medical History:  Diagnosis Date  . Hypertension   . Nephrolithiasis   . Seizures ([place])     Past Surgical History:  Procedure Laterality Date  . COLONOSCOPY WITH PROPOFOL N/A 12/14/2017   Procedure: COLONOSCOPY WITH PROPOFOL;  Surgeon: Yetta Flock, MD;  Location: [facility];  Service: Gastroenterology;  Laterality: N/A;  . HAND SURGERY Left    Collapsed vein     Current Outpatient Medications  Medication Sig Dispense Refill  . acetaminophen (TYLENOL) 650 MG CR tablet Take 1,300 mg by mouth every 8 (eight) hours as needed for pain.    [name] amLODipine (NORVASC)  10 MG tablet Take 1 tablet (10 mg total) by mouth daily. 90 tablet 3  . clopidogrel (PLAVIX) 75 MG tablet Take 75 mg by mouth daily.  3  . furosemide (LASIX) 80 MG tablet Take 2 tablets (160 mg total) by mouth every morning. And take 2 more tablets at 2pm 120 tablet 0  . linaclotide (LINZESS) 290 MCG CAPS capsule Take 1 capsule (290 mcg total) by mouth daily before breakfast. 30 capsule 0  . lisinopril (PRINIVIL,ZESTRIL) 40 MG tablet Take 40 mg by mouth daily.    . meloxicam (MOBIC) 15 MG tablet TAKE 1 TABLET BY MOUTH EVERY DAY (Patient taking differently: TAKE 15 MG BY MOUTH EVERY DAY) 30 tablet 2  . phenytoin (DILANTIN) 100 MG ER capsule Take 700 mg by mouth at bedtime.   0  . Vitamin D, Ergocalciferol, (DRISDOL) 50000 units CAPS capsule Take 1 capsule (50,000 Units total) by mouth every 7 (seven) days. (Patient taking differently: Take 50,000 Units by mouth every Tuesday. ) 12 capsule 3   No current facility-administered medications for this visit.     Allergies:   Patient has no known allergies.    Social History:  The patient  reports that he has been smoking cigarettes.  He has a 52.50 pack-year smoking history. He has never used smokeless tobacco.  He reports that he does not drink alcohol or use drugs.   Family History:  The patient's family history includes Cancer (age of onset: 69) in his mother; Heart failure (age of onset: 55) in his brother.    ROS:  Please see the history of present illness.   Otherwise, review of systems are positive for none.   All other systems are reviewed and negative.    PHYSICAL EXAM: VS:  BP (!) 149/80   Pulse 92   Ht 6' (1.829 m)   Wt (!) 404 lb 3.2 oz (183.3 kg)   BMI 54.82 kg/m  , BMI Body mass index is 54.82 kg/m. GENERAL:  Well appearing HEENT:  Pupils equal round and reactive, fundi not visualized, oral mucosa unremarkable NECK:  Positive jugular venous distention to the jaw, waveform within normal limits, carotid upstroke brisk and  symmetric, no bruits, no thyromegaly LYMPHATICS:  No cervical, inguinal adenopathy LUNGS:  Clear to auscultation bilaterally BACK:  No CVA tenderness CHEST:  Unremarkable HEART:  PMI not displaced or sustained,S1 and S2 within normal limits, no S3, no S4, no clicks, no rubs, no murmurs ABD:  Flat, positive bowel sounds normal in frequency in pitch, no bruits, no rebound, no guarding, no midline pulsatile mass, no hepatomegaly, no splenomegaly GU:  Scrotal swelling.   EXT:  2 plus pulses throughout, massive edema, no cyanosis no clubbing SKIN:  No rashes no nodules NEURO:  Cranial nerves II through XII grossly intact, motor grossly intact throughout PSYCH:  Cognitively intact, oriented to person place and time    EKG:  EKG is ordered today. The ekg ordered today demonstrates sinus rhythm, rate 92, axis within normal limits, intervals within normal limits, nonspecific lateral T wave changes.   Recent Labs: 11/01/2017: TSH 1.270 12/22/2017: ALT 18; BNP 189.2; Hemoglobin 13.7; Platelets 154 12/31/2017: BUN 16; Creatinine, Ser 0.68; Potassium 4.7; Sodium 146    Lipid Panel    Component Value Date/Time   CHOL 186 11/01/2017 0928   TRIG 64 11/01/2017 0928   HDL 46 11/01/2017 0928   CHOLHDL 4.0 11/01/2017 0928   LDLCALC 127 (H) 11/01/2017 0928      Wt Readings from Last 3 Encounters:  01/07/18 (!) 404 lb 3.2 oz (183.3 kg)  01/04/18 (!) 416 lb 8 oz (188.9 kg)  12/31/17 (!) 413 lb 3.2 oz (187.4 kg)      Other studies Reviewed: Additional studies/ records that were reviewed today include: Office records, echo. Review of the above records demonstrates:  Please see elsewhere in the note.     ASSESSMENT AND PLAN:  CARDIOMYOPATHY: Patient has a cardiomyopathy that seems to be global.  He has anasarca.  He needs to be hospitalized for IV diuretics.  We will arrange for elective admission and have contacted bed placement.  In the labs when he arrives to include TSH.  I suspect he might  need a right and left heart cath.  ANASARCA:  As above.   SNORING: He very likely has sleep apnea and this can be screened for in the hospital.  HTN:  Continue current therapy.     Current medicines are reviewed at length with the patient today.  The patient does not have concerns regarding medicines.  The following changes have been made:  As above  Labs/ tests ordered today include: None No orders of the defined types were placed in this encounter.    Disposition:   FU with after he has been discharged .  Signed, Minus Breeding, MD  01/07/2018 9:18 AM    [place]

## 2018-01-07 ENCOUNTER — Ambulatory Visit (HOSPITAL_COMMUNITY): Admission: RE | Admit: 2018-01-07 | Payer: 59 | Source: Ambulatory Visit

## 2018-01-07 ENCOUNTER — Ambulatory Visit (INDEPENDENT_AMBULATORY_CARE_PROVIDER_SITE_OTHER): Payer: 59 | Admitting: Cardiology

## 2018-01-07 ENCOUNTER — Telehealth: Payer: Self-pay

## 2018-01-07 ENCOUNTER — Inpatient Hospital Stay (HOSPITAL_COMMUNITY)
Admission: AD | Admit: 2018-01-07 | Discharge: 2018-01-13 | DRG: 287 | Disposition: A | Discharge status: Home / Self Care | Payer: 59 | Source: Ambulatory Visit | Attending: Cardiology | Admitting: Cardiology

## 2018-01-07 ENCOUNTER — Encounter: Payer: Self-pay | Admitting: Cardiology

## 2018-01-07 VITALS — BP 149/80 | HR 92 | Ht 72.0 in | Wt >= 6400 oz

## 2018-01-07 DIAGNOSIS — Z6841 Body Mass Index (BMI) 40.0 and over, adult: Secondary | ICD-10-CM | POA: Diagnosis not present

## 2018-01-07 DIAGNOSIS — I5082 Biventricular heart failure: Secondary | ICD-10-CM | POA: Diagnosis present

## 2018-01-07 DIAGNOSIS — I2510 Atherosclerotic heart disease of native coronary artery without angina pectoris: Secondary | ICD-10-CM | POA: Diagnosis present

## 2018-01-07 DIAGNOSIS — I5041 Acute combined systolic (congestive) and diastolic (congestive) heart failure: Secondary | ICD-10-CM | POA: Diagnosis not present

## 2018-01-07 DIAGNOSIS — E662 Morbid (severe) obesity with alveolar hypoventilation: Secondary | ICD-10-CM | POA: Diagnosis present

## 2018-01-07 DIAGNOSIS — E785 Hyperlipidemia, unspecified: Secondary | ICD-10-CM | POA: Diagnosis present

## 2018-01-07 DIAGNOSIS — I5021 Acute systolic (congestive) heart failure: Secondary | ICD-10-CM

## 2018-01-07 DIAGNOSIS — F17200 Nicotine dependence, unspecified, uncomplicated: Secondary | ICD-10-CM | POA: Diagnosis present

## 2018-01-07 DIAGNOSIS — I2729 Other secondary pulmonary hypertension: Secondary | ICD-10-CM | POA: Diagnosis present

## 2018-01-07 DIAGNOSIS — I429 Cardiomyopathy, unspecified: Secondary | ICD-10-CM | POA: Diagnosis present

## 2018-01-07 DIAGNOSIS — R601 Generalized edema: Secondary | ICD-10-CM | POA: Diagnosis not present

## 2018-01-07 DIAGNOSIS — I5023 Acute on chronic systolic (congestive) heart failure: Secondary | ICD-10-CM | POA: Diagnosis present

## 2018-01-07 DIAGNOSIS — I10 Essential (primary) hypertension: Secondary | ICD-10-CM | POA: Diagnosis present

## 2018-01-07 DIAGNOSIS — I110 Hypertensive heart disease with heart failure: Principal | ICD-10-CM | POA: Diagnosis present

## 2018-01-07 DIAGNOSIS — I5043 Acute on chronic combined systolic (congestive) and diastolic (congestive) heart failure: Secondary | ICD-10-CM | POA: Diagnosis not present

## 2018-01-07 DIAGNOSIS — R0683 Snoring: Secondary | ICD-10-CM

## 2018-01-07 DIAGNOSIS — R0602 Shortness of breath: Secondary | ICD-10-CM | POA: Diagnosis present

## 2018-01-07 LAB — COMPREHENSIVE METABOLIC PANEL
ALBUMIN: 3.2 g/dL — AB (ref 3.5–5.0)
ALK PHOS: 131 U/L — AB (ref 38–126)
ALT: 17 U/L (ref 17–63)
AST: 16 U/L (ref 15–41)
Anion gap: 11 (ref 5–15)
BILIRUBIN TOTAL: 0.9 mg/dL (ref 0.3–1.2)
BUN: 13 mg/dL (ref 6–20)
CO2: 31 mmol/L (ref 22–32)
CREATININE: 0.69 mg/dL (ref 0.61–1.24)
Calcium: 9 mg/dL (ref 8.9–10.3)
Chloride: 100 mmol/L — ABNORMAL LOW (ref 101–111)
GFR calc Af Amer: >60 mL/min (ref >60)
GLUCOSE: 111 mg/dL — AB (ref 65–99)
POTASSIUM: 3.7 mmol/L (ref 3.5–5.1)
Sodium: 142 mmol/L (ref 135–145)
Total Protein: 6.2 g/dL — ABNORMAL LOW (ref 6.5–8.1)

## 2018-01-07 LAB — CBC WITH DIFFERENTIAL/PLATELET
Basophils Absolute: 0 10*3/uL (ref 0.0–0.1)
Basophils Relative: 0 %
EOS ABS: 0.1 10*3/uL (ref 0.0–0.7)
EOS PCT: 1 %
HCT: 40.5 % (ref 39.0–52.0)
Hemoglobin: 12.6 g/dL — ABNORMAL LOW (ref 13.0–17.0)
LYMPHS ABS: 1.3 10*3/uL (ref 0.7–4.0)
LYMPHS PCT: 17 %
MCH: 29.8 pg (ref 26.0–34.0)
MCHC: 31.1 g/dL (ref 30.0–36.0)
MCV: 95.7 fL (ref 78.0–100.0)
MONO ABS: 0.4 10*3/uL (ref 0.1–1.0)
Monocytes Relative: 5 %
Neutro Abs: 5.9 10*3/uL (ref 1.7–7.7)
Neutrophils Relative %: 77 %
PLATELETS: 166 10*3/uL (ref 150–400)
RBC: 4.23 MIL/uL (ref 4.22–5.81)
RDW: 16 % — AB (ref 11.5–15.5)
WBC: 7.7 10*3/uL (ref 4.0–10.5)

## 2018-01-07 LAB — TSH: TSH: 1.599 u[IU]/mL (ref 0.350–4.500)

## 2018-01-07 LAB — BRAIN NATRIURETIC PEPTIDE: B Natriuretic Peptide: 230.4 pg/mL — ABNORMAL HIGH (ref 0.0–100.0)

## 2018-01-07 MED ORDER — SODIUM CHLORIDE 0.9% FLUSH
3.0000 mL | Freq: Two times a day (BID) | INTRAVENOUS | Status: DC
  Administered 2018-01-07 – 2018-01-13 (×10): 3 mL via INTRAVENOUS

## 2018-01-07 MED ORDER — SODIUM CHLORIDE 0.9 % IV SOLN: 250.0000 mL | INTRAVENOUS | Status: DC | PRN

## 2018-01-07 MED ORDER — PHENYTOIN SODIUM EXTENDED 100 MG PO CAPS
300.0000 mg | ORAL_CAPSULE | Freq: Every day | ORAL | Status: DC
  Administered 2018-01-08 – 2018-01-13 (×5): 300 mg via ORAL
  Filled 2018-01-07 (×6): qty 3

## 2018-01-07 MED ORDER — ACETAMINOPHEN 325 MG PO TABS
650.0000 mg | ORAL_TABLET | Freq: Four times a day (QID) | ORAL | Status: DC | PRN
Start: 2018-01-07 — End: 2018-01-13

## 2018-01-07 MED ORDER — PHENYTOIN SODIUM EXTENDED 100 MG PO CAPS
400.0000 mg | ORAL_CAPSULE | Freq: Every day | ORAL | Status: DC
  Administered 2018-01-07 – 2018-01-12 (×6): 400 mg via ORAL
  Filled 2018-01-07 (×6): qty 4

## 2018-01-07 MED ORDER — LISINOPRIL 40 MG PO TABS
40.0000 mg | ORAL_TABLET | Freq: Every day | ORAL | Status: DC
  Administered 2018-01-08 – 2018-01-13 (×5): 40 mg via ORAL
  Filled 2018-01-07 (×5): qty 1

## 2018-01-07 MED ORDER — CLOPIDOGREL BISULFATE 75 MG PO TABS
75.0000 mg | ORAL_TABLET | Freq: Every day | ORAL | Status: DC
  Administered 2018-01-09: 75 mg via ORAL
  Filled 2018-01-07 (×2): qty 1

## 2018-01-07 MED ORDER — ACETAMINOPHEN ER 650 MG PO TBCR: 1300.0000 mg | EXTENDED_RELEASE_TABLET | Freq: Three times a day (TID) | ORAL | Status: DC | PRN

## 2018-01-07 MED ORDER — PHENYTOIN SODIUM EXTENDED 100 MG PO CAPS: 400.0000 mg | ORAL_CAPSULE | Freq: Every day | ORAL | Status: DC

## 2018-01-07 MED ORDER — HEPARIN SODIUM (PORCINE) 5000 UNIT/ML IJ SOLN
5000.0000 [IU] | Freq: Three times a day (TID) | INTRAMUSCULAR | Status: DC
  Filled 2018-01-07 (×8): qty 1

## 2018-01-07 MED ORDER — LINACLOTIDE 145 MCG PO CAPS: 290.0000 ug | ORAL_CAPSULE | Freq: Every day | ORAL | Status: DC

## 2018-01-07 MED ORDER — FUROSEMIDE 10 MG/ML IJ SOLN
80.0000 mg | Freq: Two times a day (BID) | INTRAMUSCULAR | Status: DC
  Administered 2018-01-07 – 2018-01-11 (×9): 80 mg via INTRAVENOUS
  Filled 2018-01-07 (×9): qty 8

## 2018-01-07 MED ORDER — AMLODIPINE BESYLATE 10 MG PO TABS
10.0000 mg | ORAL_TABLET | Freq: Every day | ORAL | Status: DC
  Administered 2018-01-08 – 2018-01-13 (×5): 10 mg via ORAL
  Filled 2018-01-07 (×5): qty 1

## 2018-01-07 MED ORDER — SODIUM CHLORIDE 0.9% FLUSH: 3.0000 mL | INTRAVENOUS | Status: DC | PRN

## 2018-01-07 NOTE — Progress Notes (Signed)
MEDICATION RELATED CONSULT NOTE - INITIAL   Pharmacy Consult for phenytoin dosing review   No Known Allergies  Reviewed phenytoin dosing with patient this evening. Patient states he takes 700mg  (7 capsules) at bedtime. He has been on this dose for some time without seizure activity. He also states he gets levels approximately every 6 months.   I discussed with patient that it is not recommended to take over 400mg  at one time due to absorption issues. He is ok with splitting the dose while admitted and will consider continuing to take it this way at discharge. Will not check a phenytoin level due dose splitting at this time. If patient is admitted for 5-7 days will consider checking a level at that time.   I have made cardiology PA aware of changes.   Patient also states that cardiologist was going to take him off of his plavix due to bleeding concerns/prolonged bleeding from a small scratch. Will defer to cardiology as office note does not indicate this.   Erin Hearing PharmD., BCPS Clinical Pharmacist 01/07/2018 7:28 PM

## 2018-01-07 NOTE — Telephone Encounter (Signed)
Office contacted by [facility] patient placement that bed is avilable for pt to be admitted. Pt going to [facility]; 3E05.  They will contact patient and make him aware.

## 2018-01-07 NOTE — Progress Notes (Signed)
Pt came from direct admission from cardiac clinic, paged MD about pt's arrival, waiting for the orders Pt came  to the floor in stable condition. Vitals taken. Initial Assessment done. All immediate pertinent needs to patient addressed. Patient Guide given to patient. Important safety instructions relating to hospitalization reviewed with patient. Patient verbalized understanding. Will continue to monitor pt.  Palma Holter

## 2018-01-07 NOTE — Telephone Encounter (Signed)
This encounter was created in error - please disregard.

## 2018-01-08 ENCOUNTER — Other Ambulatory Visit: Payer: Self-pay

## 2018-01-08 ENCOUNTER — Encounter (HOSPITAL_COMMUNITY): Payer: Self-pay | Admitting: Nurse Practitioner

## 2018-01-08 DIAGNOSIS — I5023 Acute on chronic systolic (congestive) heart failure: Secondary | ICD-10-CM

## 2018-01-08 LAB — BASIC METABOLIC PANEL
ANION GAP: 9 (ref 5–15)
BUN: 14 mg/dL (ref 6–20)
CHLORIDE: 101 mmol/L (ref 101–111)
CO2: 31 mmol/L (ref 22–32)
Calcium: 8.9 mg/dL (ref 8.9–10.3)
Creatinine, Ser: 0.76 mg/dL (ref 0.61–1.24)
GFR calc Af Amer: >60 mL/min (ref >60)
GLUCOSE: 101 mg/dL — AB (ref 65–99)
POTASSIUM: 3.9 mmol/L (ref 3.5–5.1)
Sodium: 141 mmol/L (ref 135–145)

## 2018-01-08 MED ORDER — POTASSIUM CHLORIDE CRYS ER 20 MEQ PO TBCR
20.0000 meq | EXTENDED_RELEASE_TABLET | Freq: Two times a day (BID) | ORAL | Status: DC
  Administered 2018-01-08 – 2018-01-13 (×10): 20 meq via ORAL
  Filled 2018-01-08 (×10): qty 1

## 2018-01-08 NOTE — Progress Notes (Signed)
Progress Note  Patient Name: Melvin Villarreal Date of Encounter: 01/08/2018  Primary Cardiologist: Hochrein   Subjective   Continues to have anasarca, minimal shortness of breath, no chest pain. Mouth is dry  Inpatient Medications    Scheduled Meds: . amLODipine  10 mg Oral Daily  . clopidogrel  75 mg Oral Daily  . furosemide  80 mg Intravenous BID  . heparin  5,000 Units Subcutaneous Q8H  . lisinopril  40 mg Oral Daily  . phenytoin  300 mg Oral Daily   And  . phenytoin  400 mg Oral QHS  . sodium chloride flush  3 mL Intravenous Q12H   Continuous Infusions: . sodium chloride     PRN Meds: sodium chloride, acetaminophen, sodium chloride flush   Vital Signs    Vitals:   01/07/18 1644 01/07/18 2038 01/08/18 0032 01/08/18 0416  BP: 131/66 (!) 149/88 138/78 139/65  Pulse: 96 89 87 91  Resp: 18 20 20 20   Temp: 98.4 F (36.9 C) 98.1 F (36.7 C) 98.1 F (36.7 C) 98 F (36.7 C)  TempSrc: Oral Oral Oral Oral  SpO2:  94% 96% 94%  Weight: (!) 402 lb 14.4 oz (182.8 kg)   (!) 396 lb 11.2 oz (179.9 kg)  Height: 6\' 3"  (1.905 m)       Intake/Output Summary (Last 24 hours) at 01/08/2018 1206 Last data filed at 01/08/2018 0513 Gross per 24 hour  Intake 360 ml  Output 3050 ml  Net -2690 ml   Filed Weights   01/07/18 1644 01/08/18 0416  Weight: (!) 402 lb 14.4 oz (182.8 kg) (!) 396 lb 11.2 oz (179.9 kg)    Telemetry    Normal sinus rhythm.- Personally Reviewed  ECG    EKG on 12/28/17 shows sinus rhythm with intraventricular conduction delay- Personally Reviewed  Physical Exam   GEN: No acute distress.  obese Neck: No JVD Cardiac: RRR, no murmurs, rubs, or gallops.  Respiratory: Clear to auscultation bilaterally. GI: Soft, nontender, non-distended  MS: 4+ edema; No deformity. Neuro:  Nonfocal  Psych: Normal affect   Labs    Chemistry Recent Labs  Lab 01/07/18 1814 01/08/18 0239  NA 142 141  K 3.7 3.9  CL 100* 101  CO2 31 31  GLUCOSE 111* 101*  BUN  13 14  CREATININE 0.69 0.76  CALCIUM 9.0 8.9  PROT 6.2*  --   ALBUMIN 3.2*  --   AST 16  --   ALT 17  --   ALKPHOS 131*  --   BILITOT 0.9  --   GFRNONAA >60 >60  GFRAA >60 >60  ANIONGAP 11 9     Hematology Recent Labs  Lab 01/07/18 1814  WBC 7.7  RBC 4.23  HGB 12.6*  HCT 40.5  MCV 95.7  MCH 29.8  MCHC 31.1  RDW 16.0*  PLT 166    Cardiac EnzymesNo results for input(s): TROPONINI in the last 168 hours. No results for input(s): TROPIPOC in the last 168 hours.   BNP Recent Labs  Lab 01/07/18 1814  BNP 230.4*     DDimer No results for input(s): DDIMER in the last 168 hours.   Radiology    No results found.  Cardiac Studies   - Left ventricle: Diffuse hypokinesis with abnormal septal motion.   The cavity size was normal. Wall thickness was increased in a   pattern of moderate LVH. Systolic function was mildly to   moderately reduced. The estimated ejection fraction was in the  range of 40% to 45%. - Left atrium: The atrium was mildly dilated. - Right ventricle: The cavity size was moderately dilated. - Right atrium: The atrium was moderately dilated. - Atrial septum: No defect or patent foramen ovale was identified. - Pulmonary arteries: PA peak pressure: 55 mm Hg (S).  Patient Profile     57 y.o. male with morbid obesity, anasarca, cardiomyopathy here for IV diuresis.  Assessment & Plan    Cardiomyopathy, acute on chronic systolic and diastolic heart failure. - Continue with aggressive IV diuresis Lasix 80 mg twice a day.  He was out 3 L yesterday, weight is down from 402 pounds down to 396 pounds.  Continue to monitor potassium and creatinine.  I will give potassium supplementation.  Currently 3.9. -I agree that right and left heart catheterization after diuresis would be helpful.  TSH normal.  Morbid obesity -Continue to encourage weight loss.  Snoring -Most likely has obstructive sleep apnea.  Evaluation as outpatient.  Essential  hypertension -Continue with current therapy.  For questions or updates, please contact [place] Please consult [email or web address] for contact info under Cardiology/STEMI.      Signed, Candee Furbish, MD  01/08/2018, 12:06 PM

## 2018-01-08 NOTE — Progress Notes (Signed)
Pt comfortable throughout the day, diuresing well, no any bleeding from scrotal tear site, pt is comfortably resting, getting up to the bathroom, will continue to monitor the patient  Palma Holter, RN

## 2018-01-09 LAB — BASIC METABOLIC PANEL
ANION GAP: 10 (ref 5–15)
BUN: 17 mg/dL (ref 6–20)
CALCIUM: 9 mg/dL (ref 8.9–10.3)
CO2: 30 mmol/L (ref 22–32)
Chloride: 99 mmol/L — ABNORMAL LOW (ref 101–111)
Creatinine, Ser: 0.71 mg/dL (ref 0.61–1.24)
Glucose, Bld: 89 mg/dL (ref 65–99)
Potassium: 4.2 mmol/L (ref 3.5–5.1)
Sodium: 139 mmol/L (ref 135–145)

## 2018-01-09 NOTE — Progress Notes (Signed)
Progress Note  Patient Name: Melvin Villarreal Date of Encounter: 01/09/2018  Primary Cardiologist: Hochrein   Subjective   Mild SOB, no CP. Scrotal tear with minor bleeding.   Inpatient Medications    Scheduled Meds: . amLODipine  10 mg Oral Daily  . clopidogrel  75 mg Oral Daily  . furosemide  80 mg Intravenous BID  . heparin  5,000 Units Subcutaneous Q8H  . lisinopril  40 mg Oral Daily  . phenytoin  300 mg Oral Daily   And  . phenytoin  400 mg Oral QHS  . potassium chloride  20 mEq Oral BID  . sodium chloride flush  3 mL Intravenous Q12H   Continuous Infusions: . sodium chloride     PRN Meds: sodium chloride, acetaminophen, sodium chloride flush   Vital Signs    Vitals:   01/08/18 2031 01/09/18 0009 01/09/18 0554 01/09/18 1022  BP: (!) 147/67 (!) 146/82 (!) 154/70 133/65  Pulse: 76 72 73   Resp: 18  20   Temp: 98.1 F (36.7 C) 97.6 F (36.4 C) 97.7 F (36.5 C)   TempSrc: Oral Oral Oral   SpO2: 93% 91% 94%   Weight:   (!) 389 lb 1.6 oz (176.5 kg)   Height:        Intake/Output Summary (Last 24 hours) at 01/09/2018 1238 Last data filed at 01/09/2018 1000 Gross per 24 hour  Intake 720 ml  Output 4500 ml  Net -3780 ml   Filed Weights   01/07/18 1644 01/08/18 0416 01/09/18 0554  Weight: (!) 402 lb 14.4 oz (182.8 kg) (!) 396 lb 11.2 oz (179.9 kg) (!) 389 lb 1.6 oz (176.5 kg)    Telemetry    NSR.- Personally Reviewed  ECG    EKG on 12/28/17 shows sinus rhythm with intraventricular conduction delay- Personally Reviewed  Physical Exam   GEN: Well nourished, well developed, in no acute distress obese HEENT: normal  Neck: no JVD, carotid bruits, or masses Cardiac: RRR; no murmurs, rubs, or gallops, 4+ edema  Respiratory:  clear to auscultation bilaterally, normal work of breathing GI: soft, nontender, nondistended, + BS MS: no deformity or atrophy  Skin: warm and dry, no rash Neuro:  Alert and Oriented x 3, Strength and sensation are intact Psych:  euthymic mood, full affect   Labs    Chemistry Recent Labs  Lab 01/07/18 1814 01/08/18 0239 01/09/18 0430  NA 142 141 139  K 3.7 3.9 4.2  CL 100* 101 99*  CO2 31 31 30   GLUCOSE 111* 101* 89  BUN 13 14 17   CREATININE 0.69 0.76 0.71  CALCIUM 9.0 8.9 9.0  PROT 6.2*  --   --   ALBUMIN 3.2*  --   --   AST 16  --   --   ALT 17  --   --   ALKPHOS 131*  --   --   BILITOT 0.9  --   --   GFRNONAA >60 >60 >60  GFRAA >60 >60 >60  ANIONGAP 11 9 10      Hematology Recent Labs  Lab 01/07/18 1814  WBC 7.7  RBC 4.23  HGB 12.6*  HCT 40.5  MCV 95.7  MCH 29.8  MCHC 31.1  RDW 16.0*  PLT 166    Cardiac EnzymesNo results for input(s): TROPONINI in the last 168 hours. No results for input(s): TROPIPOC in the last 168 hours.   BNP Recent Labs  Lab 01/07/18 1814  BNP 230.4*  DDimer No results for input(s): DDIMER in the last 168 hours.   Radiology    No results found.  Cardiac Studies   - Left ventricle: Diffuse hypokinesis with abnormal septal motion.   The cavity size was normal. Wall thickness was increased in a   pattern of moderate LVH. Systolic function was mildly to   moderately reduced. The estimated ejection fraction was in the   range of 40% to 45%. - Left atrium: The atrium was mildly dilated. - Right ventricle: The cavity size was moderately dilated. - Right atrium: The atrium was moderately dilated. - Atrial septum: No defect or patent foramen ovale was identified. - Pulmonary arteries: PA peak pressure: 55 mm Hg (S).  Patient Profile     57 y.o. male with morbid obesity, anasarca, cardiomyopathy here for IV diuresis.  Assessment & Plan    Cardiomyopathy, acute on chronic systolic and diastolic heart failure. - Continue with aggressive IV diuresis Lasix 80 mg twice a day.  No change. He was out 3 L net again yesterday, weight is down from 402 pounds down to 389 pounds.  Continue to monitor potassium and creatinine, both of which are normal.  I will  give potassium supplementation.   -I agree that right and left heart catheterization after diuresis would be helpful.  TSH normal.  Morbid obesity -Continue to encourage weight loss. Diet modifications are of utmost importance  Snoring -Most likely has obstructive sleep apnea.  Evaluation as outpatient would be prudent  Essential hypertension -Continue with current therapy. No changes.   We will stop Plavix.  I do not see any indication for him to be on this.  For questions or updates, please contact [place] Please consult [email or web address] for contact info under Cardiology/STEMI.      Signed, Candee Furbish, MD  01/09/2018, 12:38 PM

## 2018-01-10 DIAGNOSIS — I5041 Acute combined systolic (congestive) and diastolic (congestive) heart failure: Secondary | ICD-10-CM

## 2018-01-10 LAB — BASIC METABOLIC PANEL
ANION GAP: 11 (ref 5–15)
BUN: 17 mg/dL (ref 6–20)
CALCIUM: 9.3 mg/dL (ref 8.9–10.3)
CO2: 31 mmol/L (ref 22–32)
CREATININE: 0.63 mg/dL (ref 0.61–1.24)
Chloride: 95 mmol/L — ABNORMAL LOW (ref 101–111)
GFR calc Af Amer: >60 mL/min (ref >60)
GLUCOSE: 82 mg/dL (ref 65–99)
Potassium: 3.9 mmol/L (ref 3.5–5.1)
Sodium: 137 mmol/L (ref 135–145)

## 2018-01-10 MED ORDER — SPIRONOLACTONE 25 MG PO TABS
25.0000 mg | ORAL_TABLET | Freq: Every day | ORAL | Status: DC
  Administered 2018-01-10 – 2018-01-13 (×3): 25 mg via ORAL
  Filled 2018-01-10 (×3): qty 1

## 2018-01-10 NOTE — Plan of Care (Signed)
  Problem: Clinical Measurements: Goal: Diagnostic test results will improve Outcome: Progressing   Problem: Nutrition: Goal: Adequate nutrition will be maintained Outcome: Progressing   Problem: Skin Integrity: Goal: Risk for impaired skin integrity will decrease Outcome: Progressing   

## 2018-01-10 NOTE — Progress Notes (Signed)
Progress Note  Patient Name: Melvin Villarreal Date of Encounter: 01/10/2018  Primary Cardiologist: Hochrein   Subjective   Pt denies CP or dyspnea  Inpatient Medications    Scheduled Meds: . amLODipine  10 mg Oral Daily  . furosemide  80 mg Intravenous BID  . heparin  5,000 Units Subcutaneous Q8H  . lisinopril  40 mg Oral Daily  . phenytoin  300 mg Oral Daily   And  . phenytoin  400 mg Oral QHS  . potassium chloride  20 mEq Oral BID  . sodium chloride flush  3 mL Intravenous Q12H   Continuous Infusions: . sodium chloride     PRN Meds: sodium chloride, acetaminophen, sodium chloride flush   Vital Signs    Vitals:   01/09/18 1402 01/09/18 2204 01/10/18 0658 01/10/18 0907  BP: 139/64 (!) 150/57 (!) 147/68 (!) 149/61  Pulse: 72 74 77   Resp: 18 17    Temp: 97.7 F (36.5 C) (!) 97 F (36.1 C) 97.6 F (36.4 C)   TempSrc: Oral Oral Oral   SpO2: 91% 90% 93%   Weight:   (!) 380 lb (172.4 kg)   Height:        Intake/Output Summary (Last 24 hours) at 01/10/2018 1035 Last data filed at 01/10/2018 0658 Gross per 24 hour  Intake 1080 ml  Output 4500 ml  Net -3420 ml   Filed Weights   01/08/18 0416 01/09/18 0554 01/10/18 0658  Weight: (!) 396 lb 11.2 oz (179.9 kg) (!) 389 lb 1.6 oz (176.5 kg) (!) 380 lb (172.4 kg)    Telemetry    NSR- Personally Reviewed  Physical Exam   GEN: WD morbidly obese Neck: supple Cardiac: RRR Respiratory:  CTA GI: soft, nontender, obese; scrotal edema noted Ext: 2 + edema Neuro:  No focal findings   Labs    Chemistry Recent Labs  Lab 01/07/18 1814 01/08/18 0239 01/09/18 0430 01/10/18 0147  NA 142 141 139 137  K 3.7 3.9 4.2 3.9  CL 100* 101 99* 95*  CO2 31 31 30 31   GLUCOSE 111* 101* 89 82  BUN 13 14 17 17   CREATININE 0.69 0.76 0.71 0.63  CALCIUM 9.0 8.9 9.0 9.3  PROT 6.2*  --   --   --   ALBUMIN 3.2*  --   --   --   AST 16  --   --   --   ALT 17  --   --   --   ALKPHOS 131*  --   --   --   BILITOT 0.9  --   --    --   GFRNONAA >60 >60 >60 >60  GFRAA >60 >60 >60 >60  ANIONGAP 11 9 10 11      Hematology Recent Labs  Lab 01/07/18 1814  WBC 7.7  RBC 4.23  HGB 12.6*  HCT 40.5  MCV 95.7  MCH 29.8  MCHC 31.1  RDW 16.0*  PLT 166     BNP Recent Labs  Lab 01/07/18 1814  BNP 230.4*      Cardiac Studies   - Left ventricle: Diffuse hypokinesis with abnormal septal motion.   The cavity size was normal. Wall thickness was increased in a   pattern of moderate LVH. Systolic function was mildly to   moderately reduced. The estimated ejection fraction was in the   range of 40% to 45%. - Left atrium: The atrium was mildly dilated. - Right ventricle: The cavity size was moderately  dilated. - Right atrium: The atrium was moderately dilated. - Atrial septum: No defect or patent foramen ovale was identified. - Pulmonary arteries: PA peak pressure: 55 mm Hg (S).  Patient Profile     57 y.o. male with morbid obesity, anasarca, cardiomyopathy with acute combined systolic/diastolic CHF.  Assessment & Plan    1 Cardiomyopathy, acute on chronic systolic and diastolic heart failure-I/O - 4720. Weight 380.  Continue present dose of Lasix.  Add Spironolactone 25 mg daily.  Follow potassium and renal function.  There is likely also a component of right heart failure from obesity hypoventilation syndrome and obstructive sleep apnea.  We will plan cardiac catheterization as his congestive heart failure improves.  We will likely proceed on Wednesday.   2 Morbid obesity-patient counseled on the importance of weight loss.  As outlined above he also likely has sleep apnea.  He will need a sleep study following discharge.  3 Essential hypertension-blood pressure mildly elevated.  I would likely add a beta-blocker as his CHF improves.  Continue remaining medications.   For questions or updates, please contact [place] Please consult [email or web address] for contact info under Cardiology/STEMI.       Signed, Kirk Ruths, MD  01/10/2018, 10:35 AM

## 2018-01-11 DIAGNOSIS — I5043 Acute on chronic combined systolic (congestive) and diastolic (congestive) heart failure: Secondary | ICD-10-CM

## 2018-01-11 LAB — BASIC METABOLIC PANEL
ANION GAP: 9 (ref 5–15)
BUN: 16 mg/dL (ref 6–20)
CHLORIDE: 96 mmol/L — AB (ref 101–111)
CO2: 33 mmol/L — AB (ref 22–32)
Calcium: 8.9 mg/dL (ref 8.9–10.3)
Creatinine, Ser: 0.73 mg/dL (ref 0.61–1.24)
GFR calc non Af Amer: >60 mL/min (ref >60)
Glucose, Bld: 91 mg/dL (ref 65–99)
Potassium: 3.8 mmol/L (ref 3.5–5.1)
Sodium: 138 mmol/L (ref 135–145)

## 2018-01-11 MED ORDER — CARVEDILOL 3.125 MG PO TABS
3.1250 mg | ORAL_TABLET | Freq: Two times a day (BID) | ORAL | Status: DC
  Administered 2018-01-11 – 2018-01-13 (×4): 3.125 mg via ORAL
  Filled 2018-01-11 (×4): qty 1

## 2018-01-11 MED ORDER — ATORVASTATIN CALCIUM 80 MG PO TABS: 80.0000 mg | ORAL_TABLET | Freq: Every day | ORAL | Status: DC

## 2018-01-11 MED ORDER — ATORVASTATIN CALCIUM 80 MG PO TABS
80.0000 mg | ORAL_TABLET | ORAL | Status: AC
  Administered 2018-01-11: 80 mg via ORAL
  Filled 2018-01-11: qty 1

## 2018-01-11 NOTE — Progress Notes (Signed)
Pt slept well during the night, Vitals stable, no any sign of SOB and distress noted, no any complain of pain,diuresing well, will continue to monitor the patient.  Palma Holter, RN

## 2018-01-11 NOTE — H&P (View-Only) (Signed)
Progress Note  Patient Name: Melvin Villarreal Date of Encounter: 01/11/2018  Primary Cardiologist: Hochrein   Subjective   No chest pain or dyspnea  Inpatient Medications    Scheduled Meds: . amLODipine  10 mg Oral Daily  . furosemide  80 mg Intravenous BID  . heparin  5,000 Units Subcutaneous Q8H  . lisinopril  40 mg Oral Daily  . phenytoin  300 mg Oral Daily   And  . phenytoin  400 mg Oral QHS  . potassium chloride  20 mEq Oral BID  . sodium chloride flush  3 mL Intravenous Q12H  . spironolactone  25 mg Oral Daily   Continuous Infusions: . sodium chloride     PRN Meds: sodium chloride, acetaminophen, sodium chloride flush   Vital Signs    Vitals:   01/10/18 0907 01/10/18 1206 01/10/18 2156 01/11/18 0548  BP: (!) 149/61 (!) 118/47 (!) 148/64 132/69  Pulse:  73 83 75  Resp:   18 20  Temp:  98.1 F (36.7 C) 98.2 F (36.8 C) 98.2 F (36.8 C)  TempSrc:  Oral Oral Oral  SpO2:  90% 94% 95%  Weight:    (!) 372 lb 11.2 oz (169.1 kg)  Height:        Intake/Output Summary (Last 24 hours) at 01/11/2018 1113 Last data filed at 01/11/2018 0900 Gross per 24 hour  Intake 340 ml  Output 2900 ml  Net -2560 ml   Filed Weights   01/09/18 0554 01/10/18 0658 01/11/18 0548  Weight: (!) 389 lb 1.6 oz (176.5 kg) (!) 380 lb (172.4 kg) (!) 372 lb 11.2 oz (169.1 kg)    Telemetry    NSR- Personally Reviewed  Physical Exam   GEN: WD morbidly obese NAD Neck: supple, JVD difficult to assess Cardiac: RRR, no mumur Respiratory:  CTA; no wheeze GI: soft, nontender, obese; no masses Ext: 2 + edema unchanged Neuro:  Grossly intact   Labs    Chemistry Recent Labs  Lab 01/07/18 1814  01/09/18 0430 01/10/18 0147 01/11/18 0442  NA 142   < > 139 137 138  K 3.7   < > 4.2 3.9 3.8  CL 100*   < > 99* 95* 96*  CO2 31   < > 30 31 33*  GLUCOSE 111*   < > 89 82 91  BUN 13   < > 17 17 16   CREATININE 0.69   < > 0.71 0.63 0.73  CALCIUM 9.0   < > 9.0 9.3 8.9  PROT 6.2*  --   --    --   --   ALBUMIN 3.2*  --   --   --   --   AST 16  --   --   --   --   ALT 17  --   --   --   --   ALKPHOS 131*  --   --   --   --   BILITOT 0.9  --   --   --   --   GFRNONAA >60   < > >60 >60 >60  GFRAA >60   < > >60 >60 >60  ANIONGAP 11   < > 10 11 9    < > = values in this interval not displayed.     Hematology Recent Labs  Lab 01/07/18 1814  WBC 7.7  RBC 4.23  HGB 12.6*  HCT 40.5  MCV 95.7  MCH 29.8  MCHC 31.1  RDW 16.0*  PLT  166     BNP Recent Labs  Lab 01/07/18 1814  BNP 230.4*      Cardiac Studies   - Left ventricle: Diffuse hypokinesis with abnormal septal motion.   The cavity size was normal. Wall thickness was increased in a   pattern of moderate LVH. Systolic function was mildly to   moderately reduced. The estimated ejection fraction was in the   range of 40% to 45%. - Left atrium: The atrium was mildly dilated. - Right ventricle: The cavity size was moderately dilated. - Right atrium: The atrium was moderately dilated. - Atrial septum: No defect or patent foramen ovale was identified. - Pulmonary arteries: PA peak pressure: 55 mm Hg (S).  Patient Profile     57 y.o. male with morbid obesity, anasarca, cardiomyopathy with acute combined systolic/diastolic CHF.  Assessment & Plan    1 Cardiomyopathy, acute on chronic systolic and diastolic heart failure-I/O - 3950. Weight 372.  Patient continues to improve.  Continue present dose of Lasix and spironolactone.  Follow renal function and potassium.  There is likely also a component of right heart failure from obesity hypoventilation syndrome and obstructive sleep apnea.  We will plan R and L cardiac catheterization tomorrow (risks and benefits including myocardial infarction, CVA and death discussed and he agrees to proceed).  2 Morbid obesity-patient counseled on the importance of weight loss.  We will arrange a sleep study following discharge.  3 Essential hypertension-blood pressure improved.  I  will begin carvedilol 3.125 mg twice daily.   For questions or updates, please contact [place] Please consult [email or web address] for contact info under Cardiology/STEMI.      Signed, Kirk Ruths, MD  01/11/2018, 11:13 AM

## 2018-01-11 NOTE — Progress Notes (Signed)
Progress Note  Patient Name: Melvin Villarreal Date of Encounter: 01/11/2018  Primary Cardiologist: Hochrein   Subjective   No chest pain or dyspnea  Inpatient Medications    Scheduled Meds: . amLODipine  10 mg Oral Daily  . furosemide  80 mg Intravenous BID  . heparin  5,000 Units Subcutaneous Q8H  . lisinopril  40 mg Oral Daily  . phenytoin  300 mg Oral Daily   And  . phenytoin  400 mg Oral QHS  . potassium chloride  20 mEq Oral BID  . sodium chloride flush  3 mL Intravenous Q12H  . spironolactone  25 mg Oral Daily   Continuous Infusions: . sodium chloride     PRN Meds: sodium chloride, acetaminophen, sodium chloride flush   Vital Signs    Vitals:   01/10/18 0907 01/10/18 1206 01/10/18 2156 01/11/18 0548  BP: (!) 149/61 (!) 118/47 (!) 148/64 132/69  Pulse:  73 83 75  Resp:   18 20  Temp:  98.1 F (36.7 C) 98.2 F (36.8 C) 98.2 F (36.8 C)  TempSrc:  Oral Oral Oral  SpO2:  90% 94% 95%  Weight:    (!) 372 lb 11.2 oz (169.1 kg)  Height:        Intake/Output Summary (Last 24 hours) at 01/11/2018 1113 Last data filed at 01/11/2018 0900 Gross per 24 hour  Intake 340 ml  Output 2900 ml  Net -2560 ml   Filed Weights   01/09/18 0554 01/10/18 0658 01/11/18 0548  Weight: (!) 389 lb 1.6 oz (176.5 kg) (!) 380 lb (172.4 kg) (!) 372 lb 11.2 oz (169.1 kg)    Telemetry    NSR- Personally Reviewed  Physical Exam   GEN: WD morbidly obese NAD Neck: supple, JVD difficult to assess Cardiac: RRR, no mumur Respiratory:  CTA; no wheeze GI: soft, nontender, obese; no masses Ext: 2 + edema unchanged Neuro:  Grossly intact   Labs    Chemistry Recent Labs  Lab 01/07/18 1814  01/09/18 0430 01/10/18 0147 01/11/18 0442  NA 142   < > 139 137 138  K 3.7   < > 4.2 3.9 3.8  CL 100*   < > 99* 95* 96*  CO2 31   < > 30 31 33*  GLUCOSE 111*   < > 89 82 91  BUN 13   < > 17 17 16   CREATININE 0.69   < > 0.71 0.63 0.73  CALCIUM 9.0   < > 9.0 9.3 8.9  PROT 6.2*  --   --    --   --   ALBUMIN 3.2*  --   --   --   --   AST 16  --   --   --   --   ALT 17  --   --   --   --   ALKPHOS 131*  --   --   --   --   BILITOT 0.9  --   --   --   --   GFRNONAA >60   < > >60 >60 >60  GFRAA >60   < > >60 >60 >60  ANIONGAP 11   < > 10 11 9    < > = values in this interval not displayed.     Hematology Recent Labs  Lab 01/07/18 1814  WBC 7.7  RBC 4.23  HGB 12.6*  HCT 40.5  MCV 95.7  MCH 29.8  MCHC 31.1  RDW 16.0*  PLT  166     BNP Recent Labs  Lab 01/07/18 1814  BNP 230.4*      Cardiac Studies   - Left ventricle: Diffuse hypokinesis with abnormal septal motion.   The cavity size was normal. Wall thickness was increased in a   pattern of moderate LVH. Systolic function was mildly to   moderately reduced. The estimated ejection fraction was in the   range of 40% to 45%. - Left atrium: The atrium was mildly dilated. - Right ventricle: The cavity size was moderately dilated. - Right atrium: The atrium was moderately dilated. - Atrial septum: No defect or patent foramen ovale was identified. - Pulmonary arteries: PA peak pressure: 55 mm Hg (S).  Patient Profile     57 y.o. male with morbid obesity, anasarca, cardiomyopathy with acute combined systolic/diastolic CHF.  Assessment & Plan    1 Cardiomyopathy, acute on chronic systolic and diastolic heart failure-I/O - 3950. Weight 372.  Patient continues to improve.  Continue present dose of Lasix and spironolactone.  Follow renal function and potassium.  There is likely also a component of right heart failure from obesity hypoventilation syndrome and obstructive sleep apnea.  We will plan R and L cardiac catheterization tomorrow (risks and benefits including myocardial infarction, CVA and death discussed and he agrees to proceed).  2 Morbid obesity-patient counseled on the importance of weight loss.  We will arrange a sleep study following discharge.  3 Essential hypertension-blood pressure improved.  I  will begin carvedilol 3.125 mg twice daily.   For questions or updates, please contact [place] Please consult [email or web address] for contact info under Cardiology/STEMI.      Signed, Kirk Ruths, MD  01/11/2018, 11:13 AM

## 2018-01-12 ENCOUNTER — Inpatient Hospital Stay (HOSPITAL_COMMUNITY)
Admission: AD | Disposition: A | Discharge status: Home / Self Care | Payer: Self-pay | Source: Ambulatory Visit | Attending: Cardiology

## 2018-01-12 ENCOUNTER — Ambulatory Visit (HOSPITAL_COMMUNITY): Admission: RE | Admit: 2018-01-12 | Payer: 59 | Source: Ambulatory Visit | Admitting: Cardiovascular Disease

## 2018-01-12 ENCOUNTER — Encounter (HOSPITAL_COMMUNITY): Payer: Self-pay | Admitting: Cardiovascular Disease

## 2018-01-12 ENCOUNTER — Telehealth: Payer: Self-pay

## 2018-01-12 DIAGNOSIS — I5043 Acute on chronic combined systolic (congestive) and diastolic (congestive) heart failure: Secondary | ICD-10-CM

## 2018-01-12 DIAGNOSIS — I2510 Atherosclerotic heart disease of native coronary artery without angina pectoris: Secondary | ICD-10-CM

## 2018-01-12 HISTORY — PX: RIGHT/LEFT HEART CATH AND CORONARY ANGIOGRAPHY: CATH118266

## 2018-01-12 LAB — CBC
HEMATOCRIT: 43.5 % (ref 39.0–52.0)
Hemoglobin: 13.4 g/dL (ref 13.0–17.0)
MCH: 29.6 pg (ref 26.0–34.0)
MCHC: 30.8 g/dL (ref 30.0–36.0)
MCV: 96 fL (ref 78.0–100.0)
Platelets: 180 10*3/uL (ref 150–400)
RBC: 4.53 MIL/uL (ref 4.22–5.81)
RDW: 15.6 % — AB (ref 11.5–15.5)
WBC: 7.3 10*3/uL (ref 4.0–10.5)

## 2018-01-12 LAB — POCT I-STAT 3, VENOUS BLOOD GAS (G3P V)
ACID-BASE EXCESS: 5 mmol/L — AB (ref 0.0–2.0)
Bicarbonate: 34 mmol/L — ABNORMAL HIGH (ref 20.0–28.0)
O2 SAT: 65 %
PCO2 VEN: 68.1 mmHg — AB (ref 44.0–60.0)
TCO2: 36 mmol/L — ABNORMAL HIGH (ref 22–32)
pH, Ven: 7.306 (ref 7.250–7.430)
pO2, Ven: 38 mmHg (ref 32.0–45.0)

## 2018-01-12 LAB — BASIC METABOLIC PANEL
Anion gap: 10 (ref 5–15)
BUN: 16 mg/dL (ref 6–20)
CALCIUM: 9.1 mg/dL (ref 8.9–10.3)
CO2: 33 mmol/L — AB (ref 22–32)
CREATININE: 0.68 mg/dL (ref 0.61–1.24)
Chloride: 94 mmol/L — ABNORMAL LOW (ref 101–111)
GFR calc Af Amer: >60 mL/min (ref >60)
GFR calc non Af Amer: >60 mL/min (ref >60)
GLUCOSE: 99 mg/dL (ref 65–99)
Potassium: 4.6 mmol/L (ref 3.5–5.1)
Sodium: 137 mmol/L (ref 135–145)

## 2018-01-12 LAB — POCT I-STAT 3, ART BLOOD GAS (G3+)
ACID-BASE EXCESS: 5 mmol/L — AB (ref 0.0–2.0)
Bicarbonate: 33 mmol/L — ABNORMAL HIGH (ref 20.0–28.0)
O2 Saturation: 92 %
TCO2: 35 mmol/L — ABNORMAL HIGH (ref 22–32)
pCO2 arterial: 64.2 mmHg — ABNORMAL HIGH (ref 32.0–48.0)
pH, Arterial: 7.319 — ABNORMAL LOW (ref 7.350–7.450)
pO2, Arterial: 70 mmHg — ABNORMAL LOW (ref 83.0–108.0)

## 2018-01-12 LAB — POCT ACTIVATED CLOTTING TIME: ACTIVATED CLOTTING TIME: 197 s

## 2018-01-12 LAB — PROTIME-INR
INR: 1.08
PROTHROMBIN TIME: 13.9 s (ref 11.4–15.2)

## 2018-01-12 SURGERY — RIGHT/LEFT HEART CATH AND CORONARY ANGIOGRAPHY
Anesthesia: LOCAL

## 2018-01-12 MED ORDER — VERAPAMIL HCL 2.5 MG/ML IV SOLN
INTRAVENOUS | Status: DC | PRN
  Administered 2018-01-12: 10 mL via INTRA_ARTERIAL

## 2018-01-12 MED ORDER — IOHEXOL 350 MG/ML SOLN
INTRAVENOUS | Status: DC | PRN
  Administered 2018-01-12: 125 mL via INTRA_ARTERIAL

## 2018-01-12 MED ORDER — HEPARIN SODIUM (PORCINE) 1000 UNIT/ML IJ SOLN
INTRAMUSCULAR | Status: DC | PRN
  Administered 2018-01-12: 8000 [IU] via INTRAVENOUS

## 2018-01-12 MED ORDER — FENTANYL CITRATE (PF) 100 MCG/2ML IJ SOLN
INTRAMUSCULAR | Status: DC | PRN
  Administered 2018-01-12 (×2): 25 ug via INTRAVENOUS

## 2018-01-12 MED ORDER — LIDOCAINE HCL (PF) 1 % IJ SOLN
INTRAMUSCULAR | Status: AC
  Filled 2018-01-12: qty 30

## 2018-01-12 MED ORDER — LIDOCAINE HCL (PF) 1 % IJ SOLN
INTRAMUSCULAR | Status: DC | PRN
  Administered 2018-01-12: 2 mL
  Administered 2018-01-12: 5 mL

## 2018-01-12 MED ORDER — VERAPAMIL HCL 2.5 MG/ML IV SOLN
INTRAVENOUS | Status: AC
  Filled 2018-01-12: qty 2

## 2018-01-12 MED ORDER — MIDAZOLAM HCL 2 MG/2ML IJ SOLN
INTRAMUSCULAR | Status: DC | PRN
  Administered 2018-01-12 (×2): 1 mg via INTRAVENOUS

## 2018-01-12 MED ORDER — SODIUM CHLORIDE 0.9% FLUSH: 3.0000 mL | Freq: Two times a day (BID) | INTRAVENOUS | Status: DC

## 2018-01-12 MED ORDER — HEPARIN SODIUM (PORCINE) 1000 UNIT/ML IJ SOLN
INTRAMUSCULAR | Status: AC
  Filled 2018-01-12: qty 1

## 2018-01-12 MED ORDER — TORSEMIDE 20 MG PO TABS
40.0000 mg | ORAL_TABLET | Freq: Two times a day (BID) | ORAL | Status: DC
Start: 2018-01-12 — End: 2018-01-13
  Administered 2018-01-12 – 2018-01-13 (×2): 40 mg via ORAL
  Filled 2018-01-12 (×2): qty 2

## 2018-01-12 MED ORDER — HEPARIN (PORCINE) IN NACL 2-0.9 UNIT/ML-% IJ SOLN
INTRAMUSCULAR | Status: AC | PRN
  Administered 2018-01-12 (×2): 500 mL

## 2018-01-12 MED ORDER — ONDANSETRON HCL 4 MG/2ML IJ SOLN: 4.0000 mg | Freq: Four times a day (QID) | INTRAMUSCULAR | Status: DC | PRN

## 2018-01-12 MED ORDER — FENTANYL CITRATE (PF) 100 MCG/2ML IJ SOLN
INTRAMUSCULAR | Status: AC
  Filled 2018-01-12: qty 2

## 2018-01-12 MED ORDER — HEPARIN (PORCINE) IN NACL 2-0.9 UNIT/ML-% IJ SOLN
INTRAMUSCULAR | Status: AC
  Filled 2018-01-12: qty 1000

## 2018-01-12 MED ORDER — SODIUM CHLORIDE 0.9% FLUSH: 3.0000 mL | INTRAVENOUS | Status: DC | PRN

## 2018-01-12 MED ORDER — ASPIRIN 81 MG PO CHEW
81.0000 mg | CHEWABLE_TABLET | ORAL | Status: AC
  Administered 2018-01-12: 81 mg via ORAL
  Filled 2018-01-12: qty 1

## 2018-01-12 MED ORDER — SODIUM CHLORIDE 0.9 % IV SOLN
INTRAVENOUS | Status: DC
  Administered 2018-01-12: 06:00:00 via INTRAVENOUS

## 2018-01-12 MED ORDER — SODIUM CHLORIDE 0.9 % IV SOLN: 250.0000 mL | INTRAVENOUS | Status: DC | PRN

## 2018-01-12 MED ORDER — SODIUM CHLORIDE 0.9% FLUSH
3.0000 mL | Freq: Two times a day (BID) | INTRAVENOUS | Status: DC
  Administered 2018-01-12 – 2018-01-13 (×2): 3 mL via INTRAVENOUS

## 2018-01-12 MED ORDER — MIDAZOLAM HCL 2 MG/2ML IJ SOLN
INTRAMUSCULAR | Status: AC
  Filled 2018-01-12: qty 2

## 2018-01-12 MED ORDER — SODIUM CHLORIDE 0.9 % IV SOLN: INTRAVENOUS | Status: DC

## 2018-01-12 SURGICAL SUPPLY — 22 items
BAND CMPR LRG ZPHR (HEMOSTASIS) ×1
BAND ZEPHYR COMPRESS 30 LONG (HEMOSTASIS) ×1 IMPLANT
CATH INFINITI 5 FR JL3.5 (CATHETERS) ×1 IMPLANT
CATH INFINITI 5FR ANG PIGTAIL (CATHETERS) ×1 IMPLANT
CATH INFINITI JR4 5F (CATHETERS) ×1 IMPLANT
CATH LAUNCHER 5F EBU4.0 (CATHETERS) ×1 IMPLANT
CATH SWAN GANZ 7F STRAIGHT (CATHETERS) ×1 IMPLANT
GUIDEWIRE INQWIRE 1.5J.035X260 (WIRE) IMPLANT
INQWIRE 1.5J .035X260CM (WIRE) ×2
KIT HEART LEFT (KITS) ×2 IMPLANT
KIT MICROPUNCTURE NIT STIFF (SHEATH) ×1 IMPLANT
NDL PERC 21GX4CM (NEEDLE) IMPLANT
NEEDLE PERC 21GX4CM (NEEDLE) ×2 IMPLANT
PACK CARDIAC CATHETERIZATION (CUSTOM PROCEDURE TRAY) ×2 IMPLANT
SHEATH AVANTI 11CM 7FR (SHEATH) ×1 IMPLANT
SHEATH AVANTI 11CM 8FR (SHEATH) ×1 IMPLANT
SHEATH PINNACLE 7F 10CM (SHEATH) ×1 IMPLANT
SHEATH RAIN RADIAL 21G 6FR (SHEATH) ×1 IMPLANT
SYR MEDRAD MARK V 150ML (SYRINGE) ×1 IMPLANT
TRANSDUCER W/STOPCOCK (MISCELLANEOUS) ×2 IMPLANT
TUBING CIL FLEX 10 FLL-RA (TUBING) ×2 IMPLANT
WIRE AMPLATZ ST .035X145CM (WIRE) ×1 IMPLANT

## 2018-01-12 NOTE — Progress Notes (Signed)
Progress Note  Patient Name: Melvin Villarreal Date of Encounter: 01/12/2018  Primary Cardiologist: Hochrein   Subjective   Pt denies CP or dyspnea  Inpatient Medications    Scheduled Meds: . amLODipine  10 mg Oral Daily  . carvedilol  3.125 mg Oral BID WC  . furosemide  80 mg Intravenous BID  . heparin  5,000 Units Subcutaneous Q8H  . lisinopril  40 mg Oral Daily  . phenytoin  300 mg Oral Daily   And  . phenytoin  400 mg Oral QHS  . potassium chloride  20 mEq Oral BID  . sodium chloride flush  3 mL Intravenous Q12H  . sodium chloride flush  3 mL Intravenous Q12H  . spironolactone  25 mg Oral Daily   Continuous Infusions: . sodium chloride    . sodium chloride     PRN Meds: sodium chloride, sodium chloride, acetaminophen, ondansetron (ZOFRAN) IV, sodium chloride flush, sodium chloride flush   Vital Signs    Vitals:   01/12/18 1105 01/12/18 1120 01/12/18 1135 01/12/18 1150  BP: (!) 141/70 (!) 147/69 139/75 103/89  Pulse: 70 69 73 74  Resp: 16 16 18 18   Temp:      TempSrc:      SpO2: 91% 90% 92% 91%  Weight:      Height:        Intake/Output Summary (Last 24 hours) at 01/12/2018 1214 Last data filed at 01/12/2018 1054 Gross per 24 hour  Intake 385.5 ml  Output 6400 ml  Net -6014.5 ml   Filed Weights   01/10/18 0658 01/11/18 0548 01/12/18 0553  Weight: (!) 380 lb (172.4 kg) (!) 372 lb 11.2 oz (169.1 kg) (!) 367 lb 6.4 oz (166.7 kg)    Telemetry    NSR- Personally Reviewed  Physical Exam   GEN: WD morbidly obese NAD; s/p cath Neck: supple Cardiac: RRR Respiratory:  CTA; no rhonchi GI: soft, NT/ND Ext: chronic skin change and 1+ edema Neuro:  No focal findings   Labs    Chemistry Recent Labs  Lab 01/07/18 1814  01/10/18 0147 01/11/18 0442 01/12/18 0426  NA 142   < > 137 138 137  K 3.7   < > 3.9 3.8 4.6  CL 100*   < > 95* 96* 94*  CO2 31   < > 31 33* 33*  GLUCOSE 111*   < > 82 91 99  BUN 13   < > 17 16 16   CREATININE 0.69   < > 0.63 0.73  0.68  CALCIUM 9.0   < > 9.3 8.9 9.1  PROT 6.2*  --   --   --   --   ALBUMIN 3.2*  --   --   --   --   AST 16  --   --   --   --   ALT 17  --   --   --   --   ALKPHOS 131*  --   --   --   --   BILITOT 0.9  --   --   --   --   GFRNONAA >60   < > >60 >60 >60  GFRAA >60   < > >60 >60 >60  ANIONGAP 11   < > 11 9 10    < > = values in this interval not displayed.     Hematology Recent Labs  Lab 01/07/18 1814 01/12/18 0426  WBC 7.7 7.3  RBC 4.23 4.53  HGB 12.6*  13.4  HCT 40.5 43.5  MCV 95.7 96.0  MCH 29.8 29.6  MCHC 31.1 30.8  RDW 16.0* 15.6*  PLT 166 180     BNP Recent Labs  Lab 01/07/18 1814  BNP 230.4*      Cardiac Studies   - Left ventricle: Diffuse hypokinesis with abnormal septal motion.   The cavity size was normal. Wall thickness was increased in a   pattern of moderate LVH. Systolic function was mildly to   moderately reduced. The estimated ejection fraction was in the   range of 40% to 45%. - Left atrium: The atrium was mildly dilated. - Right ventricle: The cavity size was moderately dilated. - Right atrium: The atrium was moderately dilated. - Atrial septum: No defect or patent foramen ovale was identified. - Pulmonary arteries: PA peak pressure: 55 mm Hg (S).  Patient Profile     57 y.o. male with morbid obesity, anasarca, cardiomyopathy with acute combined systolic/diastolic CHF.  Assessment & Plan    1 Cardiomyopathy, acute on chronic systolic and diastolic heart failure-I/O - 6040. Weight 367.  Cath results noted; LVEDP normal; moderate pulmonary hypertension. DC IV lasix and treat with demadex 40 mg BID. Continue spironolactone.  Follow renal function and potassium.  There is a component of right heart failure from obesity hypoventilation syndrome and obstructive sleep apnea.  Needs fluid restriction to 1.5 liters daily and low Na diet at home.  2 Morbid obesity-patient counseled on the importance of weight loss.  We will arrange a sleep study  following discharge.  3 Essential hypertension-blood pressure controlled; continue present meds.   4 CAD-medical therapy recommended; continue asa and statin.  Probable DC in AM    For questions or updates, please contact [facility] Please consult [email or web address] for contact info under Cardiology/STEMI.      Signed, Kirk Ruths, MD  01/12/2018, 12:14 PM

## 2018-01-12 NOTE — Progress Notes (Signed)
Mild bleeding noted after 3cc removal, but controlled. Will wait for next deflation.

## 2018-01-12 NOTE — Progress Notes (Signed)
Call placed to pharmacy to inquire about dilantin dosing.  Per pharmacy, any dose over 400 mg at once is unpredictable in regard to amount absorbed.   Pt has been likely taking the medication inaccurately at home, in turn was educated on the reasons why it is scheduled the way that is here in the hospital.

## 2018-01-12 NOTE — Progress Notes (Signed)
Patient resting comfortably during shift report. Denies complaints. Family at bedside.

## 2018-01-12 NOTE — Progress Notes (Signed)
Pt gone to cathlab. 

## 2018-01-12 NOTE — Progress Notes (Signed)
   01/12/18 1155  First Vascular Site Assessment  #1 - Vascular Site Assessment Scale Level 0  #1 - Location of Site Assessment Right radial  #1 - Air in TR Band 13 cc  Second Vascular Site Assessment  #2 - Vascular Site Assessment Scale Level 0  #2 - Location of Site Assessment Other (Comment)    Attempt to remove 1cc, bleeding noted. Re-inserted 1cc.

## 2018-01-12 NOTE — Progress Notes (Addendum)
1cc removed from radial band, trace bleeding noted. Will delay next deflation.

## 2018-01-12 NOTE — Interval H&P Note (Signed)
History and Physical Interval Note:  01/12/2018 8:23 AM  Melvin Villarreal  has presented today for surgery, with the diagnosis of hf  The various methods of treatment have been discussed with the patient and family. After consideration of risks, benefits and other options for treatment, the patient has consented to  Procedure(s): RIGHT/LEFT HEART CATH AND CORONARY ANGIOGRAPHY (N/A) as a surgical intervention .  The patient's history has been reviewed, patient examined, no change in status, stable for surgery.  I have reviewed the patient's chart and labs.  Questions were answered to the patient's satisfaction.     Sherren Mocha

## 2018-01-12 NOTE — Progress Notes (Signed)
Site area: a 7 french venous sheath was removed from Internal Jugular  Site Prior to Removal:  Level 0  Pressure Applied For 10 MINUTES    Bedrest Beginning at 1020am  Manual:   Yes.    Patient Status During Pull:  stable  Post Pull Groin Site:  Level 0  Post Pull Instructions Given:  Yes.    Post Pull Pulses Present:  Yes.    Dressing Applied:  Yes.    Comments:  VS remain stable

## 2018-01-12 NOTE — Telephone Encounter (Signed)
Insurance denied prior auth for [employer]  Needs a trial of polyethylene glycol or lactulose

## 2018-01-12 NOTE — Progress Notes (Signed)
Slight bleeding with attempt to remove more air.

## 2018-01-13 ENCOUNTER — Telehealth: Payer: Self-pay | Admitting: Family Medicine

## 2018-01-13 ENCOUNTER — Other Ambulatory Visit: Payer: Self-pay | Admitting: Physician Assistant

## 2018-01-13 DIAGNOSIS — I5023 Acute on chronic systolic (congestive) heart failure: Secondary | ICD-10-CM

## 2018-01-13 DIAGNOSIS — R0683 Snoring: Secondary | ICD-10-CM

## 2018-01-13 LAB — BASIC METABOLIC PANEL
ANION GAP: 11 (ref 5–15)
BUN: 15 mg/dL (ref 6–20)
CALCIUM: 9.1 mg/dL (ref 8.9–10.3)
CO2: 33 mmol/L — AB (ref 22–32)
Chloride: 93 mmol/L — ABNORMAL LOW (ref 101–111)
Creatinine, Ser: 0.69 mg/dL (ref 0.61–1.24)
GFR calc non Af Amer: >60 mL/min (ref >60)
Glucose, Bld: 101 mg/dL — ABNORMAL HIGH (ref 65–99)
Potassium: 3.8 mmol/L (ref 3.5–5.1)
Sodium: 137 mmol/L (ref 135–145)

## 2018-01-13 MED ORDER — ASPIRIN 81 MG PO TBEC: 81.0000 mg | DELAYED_RELEASE_TABLET | Freq: Every day | ORAL | 3 refills | Status: AC

## 2018-01-13 MED ORDER — CARVEDILOL 3.125 MG PO TABS: 3.1250 mg | ORAL_TABLET | Freq: Two times a day (BID) | ORAL | 6 refills | Status: DC

## 2018-01-13 MED ORDER — TORSEMIDE 20 MG PO TABS: ORAL_TABLET | ORAL | 6 refills | Status: DC

## 2018-01-13 MED ORDER — ATORVASTATIN CALCIUM 80 MG PO TABS: 80.0000 mg | ORAL_TABLET | Freq: Every day | ORAL | 3 refills | Status: DC

## 2018-01-13 MED ORDER — ASPIRIN EC 81 MG PO TBEC
81.0000 mg | DELAYED_RELEASE_TABLET | Freq: Every day | ORAL | Status: DC
  Administered 2018-01-13: 81 mg via ORAL
  Filled 2018-01-13: qty 1

## 2018-01-13 MED ORDER — POTASSIUM CHLORIDE CRYS ER 20 MEQ PO TBCR
20.0000 meq | EXTENDED_RELEASE_TABLET | Freq: Two times a day (BID) | ORAL | 6 refills | Status: DC
Start: 2018-01-13 — End: 2018-06-16

## 2018-01-13 MED ORDER — SPIRONOLACTONE 25 MG PO TABS: 25.0000 mg | ORAL_TABLET | Freq: Every day | ORAL | 3 refills | Status: DC

## 2018-01-13 MED ORDER — ATORVASTATIN CALCIUM 80 MG PO TABS: 80.0000 mg | ORAL_TABLET | Freq: Every day | ORAL | Status: DC

## 2018-01-13 NOTE — Telephone Encounter (Signed)
Referral placed.

## 2018-01-13 NOTE — Addendum Note (Signed)
Addended by: Vennie Homans on: 01/13/2018 02:19 PM   Modules accepted: Orders

## 2018-01-13 NOTE — Progress Notes (Signed)
Progress Note  Patient Name: Melvin Villarreal Date of Encounter: 01/13/2018  Primary Cardiologist: Hochrein   Subjective   No CP or dyspnea  Inpatient Medications    Scheduled Meds: . amLODipine  10 mg Oral Daily  . carvedilol  3.125 mg Oral BID WC  . heparin  5,000 Units Subcutaneous Q8H  . lisinopril  40 mg Oral Daily  . phenytoin  300 mg Oral Daily   And  . phenytoin  400 mg Oral QHS  . potassium chloride  20 mEq Oral BID  . sodium chloride flush  3 mL Intravenous Q12H  . sodium chloride flush  3 mL Intravenous Q12H  . spironolactone  25 mg Oral Daily  . torsemide  40 mg Oral BID   Continuous Infusions: . sodium chloride    . sodium chloride     PRN Meds: sodium chloride, sodium chloride, acetaminophen, ondansetron (ZOFRAN) IV, sodium chloride flush, sodium chloride flush   Vital Signs    Vitals:   01/12/18 1800 01/12/18 2021 01/12/18 2216 01/13/18 0607  BP: (!) 146/75 (!) 152/60 (!) 147/72 (!) 142/53  Pulse: 78 78 81 75  Resp:  18 (!) 24 20  Temp:  98.3 F (36.8 C) 98.4 F (36.9 C) 98.2 F (36.8 C)  TempSrc:  Oral Oral Oral  SpO2:  95% 95% 95%  Weight:    (!) 355 lb (161 kg)  Height:        Intake/Output Summary (Last 24 hours) at 01/13/2018 1004 Last data filed at 01/13/2018 0840 Gross per 24 hour  Intake 1200 ml  Output 6050 ml  Net -4850 ml   Filed Weights   01/11/18 0548 01/12/18 0553 01/13/18 0607  Weight: (!) 372 lb 11.2 oz (169.1 kg) (!) 367 lb 6.4 oz (166.7 kg) (!) 355 lb (161 kg)    Telemetry    NSR- Personally Reviewed  Physical Exam   GEN: WD morbidly obese NAD Neck: supple; no JVD Cardiac: RRR, no murmur Respiratory:  CTA; no wheeze GI: soft, NT/ND, no masses; scrotal edema improved Ext: chronic skin change and 1+ edema Neuro:  Grossly intact   Labs    Chemistry Recent Labs  Lab 01/07/18 1814  01/11/18 0442 01/12/18 0426 01/13/18 0442  NA 142   < > 138 137 137  K 3.7   < > 3.8 4.6 3.8  CL 100*   < > 96* 94* 93*    CO2 31   < > 33* 33* 33*  GLUCOSE 111*   < > 91 99 101*  BUN 13   < > 16 16 15   CREATININE 0.69   < > 0.73 0.68 0.69  CALCIUM 9.0   < > 8.9 9.1 9.1  PROT 6.2*  --   --   --   --   ALBUMIN 3.2*  --   --   --   --   AST 16  --   --   --   --   ALT 17  --   --   --   --   ALKPHOS 131*  --   --   --   --   BILITOT 0.9  --   --   --   --   GFRNONAA >60   < > >60 >60 >60  GFRAA >60   < > >60 >60 >60  ANIONGAP 11   < > 9 10 11    < > = values in this interval not displayed.  Hematology Recent Labs  Lab 01/07/18 1814 01/12/18 0426  WBC 7.7 7.3  RBC 4.23 4.53  HGB 12.6* 13.4  HCT 40.5 43.5  MCV 95.7 96.0  MCH 29.8 29.6  MCHC 31.1 30.8  RDW 16.0* 15.6*  PLT 166 180     BNP Recent Labs  Lab 01/07/18 1814  BNP 230.4*      Cardiac Studies   - Left ventricle: Diffuse hypokinesis with abnormal septal motion.   The cavity size was normal. Wall thickness was increased in a   pattern of moderate LVH. Systolic function was mildly to   moderately reduced. The estimated ejection fraction was in the   range of 40% to 45%. - Left atrium: The atrium was mildly dilated. - Right ventricle: The cavity size was moderately dilated. - Right atrium: The atrium was moderately dilated. - Atrial septum: No defect or patent foramen ovale was identified. - Pulmonary arteries: PA peak pressure: 55 mm Hg (S).  Patient Profile     57 y.o. male with morbid obesity, anasarca, cardiomyopathy with acute combined systolic/diastolic CHF.  Assessment & Plan    1 Cardiomyopathy, acute on chronic systolic and diastolic heart failure-I/O - 5277. Weight 355.  Cath results showed normal LVEDP and moderate pulmonary hypertension. Change demadex to 40 mg in AM and 20 mg in PM. Continue spironolactone. There is a component of right heart failure from obesity hypoventilation syndrome and obstructive sleep apnea.  Needs fluid restriction to 1.5 liters daily and low Na diet at home.  2 Morbid obesity-patient  counseled on the importance of weight loss.  We will arrange a sleep study following discharge.  3 Essential hypertension-blood pressure mildly elevated today; will follow as outpt and advance meds as needed.   4 CAD-continue asa and statin.  DC today with TOC appt one week; check Bmet at that time; FU with Dr Percival Spanish 3 months. >30 min PA and physician time D2   For questions or updates, please contact [facility] Please consult [email or web address] for contact info under Cardiology/STEMI.      Signed, Kirk Ruths, MD  01/13/2018, 10:04 AM

## 2018-01-13 NOTE — Telephone Encounter (Signed)
[facility] called and stated that Melvin Villarreal needed an out patient sleep study ordered per his cardiologist. The hospital can no longer order this the PCP must order. Please put order in for patient. Please advise.

## 2018-01-13 NOTE — Discharge Summary (Signed)
Discharge Summary    Patient ID: Melvin Villarreal,  MRN: [id number], DOB/AGE: 1960/12/17 57 y.o.  Admit date: 01/07/2018 Discharge date: 01/13/2018  Primary Care Provider: Claretta Fraise Primary Cardiologist: Minus Breeding, MD  Discharge Diagnoses    Principal Problem:   Acute on chronic systolic CHF (congestive heart failure) [facility]) Active Problems:   Hyperlipemia   Current smoker   Morbid obesity ([place])   Hypertension    Allergies No Known Allergies  Diagnostic Studies/Procedures    Right and left heart cath 01/12/18:  Prox RCA lesion is 95% stenosed.  Mid RCA lesion is 95% stenosed.  Dist RCA lesion is 80% stenosed.  Mid LM to Dist LM lesion is 40% stenosed.  Ost LAD to Prox LAD lesion is 50% stenosed.  Prox LAD to Mid LAD lesion is 50% stenosed.  Prox Cx to Mid Cx lesion is 70% stenosed.  Ost 1st Mrg to 1st Mrg lesion is 30% stenosed.  The left ventricular ejection fraction is 45-50% by visual estimate.  LV end diastolic pressure is normal.  Hemodynamic findings consistent with moderate pulmonary hypertension.  LV end diastolic pressure is normal.   1.  Moderate pulmonary hypertension with pulmonary vascular resistance approximately 3 Wood units 2.  Multivessel coronary artery disease with chronic subtotal occlusion of the RCA with multiple severe stenoses and left to right collateral filling of the distal branch vessels, moderate irregular stenoses of the LAD, and moderate stenosis of the AV circumflex 3.  Normal LVEDP  Recommendations: The patient appears to primarily have hemodynamic findings consistent with right heart failure.  Fortunately his cardiac output is preserved.  I would suggest aggressive medical therapy for his coronary artery disease.   Echo 12/29/17: Study Conclusions - Left ventricle: Diffuse hypokinesis with abnormal septal motion.   The cavity size was normal. Wall thickness was increased in a   pattern of moderate LVH. Systolic  function was mildly to   moderately reduced. The estimated ejection fraction was in the   range of 40% to 45%. - Left atrium: The atrium was mildly dilated. - Right ventricle: The cavity size was moderately dilated. - Right atrium: The atrium was moderately dilated. - Atrial septum: No defect or patent foramen ovale was identified. - Pulmonary arteries: PA peak pressure: 55 mm Hg (S).   History of Present Illness     Pt was referred to cardiology by his PCP for a 45 lb weight gain since Christmas. He was seen by Dr. Percival Spanish 01/07/18.   Melvin Villarreal is a 57 y.o. male who is referred by Claretta Fraise, MD for evaluation of edema.  The patient has no past cardiac history.  He was on Lasix for years because he had some scrotal swelling in the past.  Since Christmas he is gained probably 45 pounds.  He is seeing a new primary provider and he has had his Lasix increased to 80 mg a day and now 160 mg twice daily.  He thinks he is lost some fluid but he still has massive edema.  Today while taking a shower he nicked with his fingernail his scrotum and he has had continued bleeding from that.  He has increasing shortness of breath walking short to moderate distance on level ground.  He is not describing PND or orthopnea.  Is not having any chest pressure, neck or arm discomfort.  He did have an echo last week with an EF of 40 - 45%.  He had moderate RV dilatation and  some elevated pulmonary pressures.  He had mild pulmonary pressures.  This was done to evaluate swelling and SOB.  Of note the patient does have snoring but is never been diagnosed with sleep apnea.  The decision was made for an elective admission for IV diuresis.   Hospital Course     Consultants: none  Cardiomyopathy, acute on chronic systolic and diastolic heart failure Pt was admitted to cardiology to stabilize his fluid status.  He was diuresed and is overall net negative 25 L with 6L urine output yesterday. His weight is 355 lbs,  down from 402 lbs on admission. His demadex was changed to 40 mg in the AM and 20 mg in the PM. Spironolactone as added to his regimen. Follow daily weights, low sodium diet, and 1.5L fluid restriction. Pulmonary hypertension and normal LVEDP. There is a component of right heart failure from obesity hypoventilation syndrome and OSA.   Coronary Artery Disease Right and left heart cath showed Multivessel coronary artery disease with chronic subtotal occlusion of the RCA with multiple severe stenoses and left to right collateral filling of the distal branch vessels, moderate irregular stenoses of the LAD, and moderate stenosis of the AV circumflex.  Started on ASA and statin. Check FLP and LFTs in 6-8 weeks. LDL in Jan 2019 was 127. LDL goal is now less than 70.   Hypertension Pressures mildly elevated today. Will follow as an outpatient and titrate meds as needed.    Morbid obesity Suspect a component of obesity hypoventilation syndrome and OSA (below).   Snoring, daytime sleepiness Needs a sleep study as an outpatient.   _____________  Discharge Vitals Blood pressure (!) 142/53, pulse 75, temperature 98.2 F (36.8 C), temperature source Oral, resp. rate 20, height 6\' 3"  (1.905 m), weight (!) 355 lb (161 kg), SpO2 95 %.  Filed Weights   01/11/18 0548 01/12/18 0553 01/13/18 0607  Weight: (!) 372 lb 11.2 oz (169.1 kg) (!) 367 lb 6.4 oz (166.7 kg) (!) 355 lb (161 kg)    Labs & Radiologic Studies    CBC Recent Labs    01/12/18 0426  WBC 7.3  HGB 13.4  HCT 43.5  MCV 96.0  PLT 811   Basic Metabolic Panel Recent Labs    01/12/18 0426 01/13/18 0442  NA 137 137  K 4.6 3.8  CL 94* 93*  CO2 33* 33*  GLUCOSE 99 101*  BUN 16 15  CREATININE 0.68 0.69  CALCIUM 9.1 9.1   Liver Function Tests No results for input(s): AST, ALT, ALKPHOS, BILITOT, PROT, ALBUMIN in the last 72 hours. No results for input(s): LIPASE, AMYLASE in the last 72 hours. Cardiac Enzymes No results for  input(s): CKTOTAL, CKMB, CKMBINDEX, TROPONINI in the last 72 hours. BNP Invalid input(s): POCBNP D-Dimer No results for input(s): DDIMER in the last 72 hours. Hemoglobin A1C No results for input(s): HGBA1C in the last 72 hours. Fasting Lipid Panel No results for input(s): CHOL, HDL, LDLCALC, TRIG, CHOLHDL, LDLDIRECT in the last 72 hours. Thyroid Function Tests No results for input(s): TSH, T4TOTAL, T3FREE, THYROIDAB in the last 72 hours.  Invalid input(s): FREET3 _____________  Dg Chest 2 View  Result Date: 12/23/2017 CLINICAL DATA:  Cough and shortness of breath, smoking history EXAM: CHEST - 2 VIEW COMPARISON:  Chest x-ray of 11/15/2015 FINDINGS: No active infiltrate or effusion is seen. Linear scarring in the right mid lung is stable. Some blunting of the right costophrenic angle appears chronic and most likely due to pleural thickening.  Cardiomegaly is stable. No acute bony abnormality is seen. IMPRESSION: 1. No definite active process. Chronic changes with blunting of the right costophrenic angle appearing stable. 2. Stable cardiomegaly. Electronically Signed   By: Ivar Drape M.D.   On: 12/23/2017 08:26   Disposition   Pt is being discharged home today in good condition.  Follow-up Plans & Appointments     Discharge Instructions    Diet - low sodium heart healthy   Complete by:  As directed    Discharge instructions   Complete by:  As directed    No driving for 2 days. No lifting over 5 lbs for 1 week. No sexual activity for 1 week. You may return to work in 1 week. Keep procedure site clean & dry. If you notice increased pain, swelling, bleeding or pus, call/return!  You may shower, but no soaking baths/hot tubs/pools for 1 week.   Increase activity slowly   Complete by:  As directed       Discharge Medications   Allergies as of 01/13/2018   No Known Allergies     Medication List    STOP taking these medications   furosemide 80 MG tablet Commonly known as:  LASIX      TAKE these medications   acetaminophen 650 MG CR tablet Commonly known as:  TYLENOL Take 1,300 mg by mouth every 8 (eight) hours as needed for pain.   amLODipine 10 MG tablet Commonly known as:  NORVASC Take 1 tablet (10 mg total) by mouth daily. What changed:  when to take this   aspirin 81 MG EC tablet Take 1 tablet (81 mg total) by mouth daily.   atorvastatin 80 MG tablet Commonly known as:  LIPITOR Take 1 tablet (80 mg total) by mouth daily at 6 PM.   carvedilol 3.125 MG tablet Commonly known as:  COREG Take 1 tablet (3.125 mg total) by mouth 2 (two) times daily with a meal.   linaclotide 290 MCG Caps capsule Commonly known as:  LINZESS Take 1 capsule (290 mcg total) by mouth daily before breakfast.   lisinopril 40 MG tablet Commonly known as:  PRINIVIL,ZESTRIL Take 40 mg by mouth daily.   meloxicam 15 MG tablet Commonly known as:  MOBIC TAKE 1 TABLET BY MOUTH EVERY DAY What changed:    how much to take  how to take this  when to take this   phenytoin 100 MG ER capsule Commonly known as:  DILANTIN Take 700 mg by mouth at bedtime.   potassium chloride SA 20 MEQ tablet Commonly known as:  K-DUR,KLOR-CON Take 1 tablet (20 mEq total) by mouth 2 (two) times daily.   spironolactone 25 MG tablet Commonly known as:  ALDACTONE Take 1 tablet (25 mg total) by mouth daily. Start taking on:  01/14/2018   torsemide 20 MG tablet Commonly known as:  DEMADEX Take 40 mg (2 tablets) in the morning and 20 mg (1 tablet) in the evening.   Vitamin D (Ergocalciferol) 50000 units Caps capsule Commonly known as:  DRISDOL Take 1 capsule (50,000 Units total) by mouth every 7 (seven) days. What changed:  when to take this         Outstanding Labs/Studies   Needs TCM and BMP in 1 week; FLP and LFTs in 6-8 weeks  Needs to setup for OP sleep study  Needs FU with Dr. Percival Spanish in 3 months   Duration of Discharge Encounter   Greater than 30 minutes including physician  time.  Signed,  Tami Lin Govind Furey NP 01/13/2018, 11:46 AM

## 2018-01-13 NOTE — Progress Notes (Signed)
Patient needs Outpatient Sleep Study; CM contacted patient's PCP Dr Livia Snellen; talked to Caryl Pina at his office, she will give him the message to arrange an outpatient sleep study; Per [place], only the PCP or Pulmonologist can make a referral for Sleep Study. Mindi Slicker RN,MHA,BSn [phone]

## 2018-01-14 ENCOUNTER — Encounter: Payer: Self-pay | Admitting: Family Medicine

## 2018-01-14 ENCOUNTER — Ambulatory Visit (INDEPENDENT_AMBULATORY_CARE_PROVIDER_SITE_OTHER): Payer: 59 | Admitting: Family Medicine

## 2018-01-14 ENCOUNTER — Telehealth: Payer: Self-pay | Admitting: Cardiology

## 2018-01-14 VITALS — BP 100/56 | HR 93 | Temp 100.6°F | Ht 75.0 in | Wt 359.5 lb

## 2018-01-14 DIAGNOSIS — I5023 Acute on chronic systolic (congestive) heart failure: Secondary | ICD-10-CM | POA: Diagnosis not present

## 2018-01-14 MED FILL — Heparin Sodium (Porcine) 2 Unit/ML in Sodium Chloride 0.9%: INTRAMUSCULAR | Qty: 1000 | Status: AC

## 2018-01-14 NOTE — Patient Instructions (Signed)
If weight goes up from baseline more than 2 pounds add 20 mg demadex.  If weight goes down from baseline more than 2 pounds decrease 20mg  demadex.

## 2018-01-14 NOTE — Telephone Encounter (Signed)
Patient contacted regarding discharge from [facility] 01/13/18.  Patient understands to follow up with Rosaria Ferries PA 01/20/18 at 8:30 am. Patient understands discharge instructions. Patient understands medications and regiment. Patient understands to bring all medications to this visit.

## 2018-01-14 NOTE — Telephone Encounter (Signed)
TCM Phone Call   Appt is 01/20/18 at 8:30am w/ Rosaria Ferries   Thanks

## 2018-01-14 NOTE — Progress Notes (Signed)
Subjective:  Patient ID: Melvin Villarreal, male    DOB: [birth year]  Age: 57 y.o. MRN: [id number]  CC: Hospitalization Follow-up (pt here today for hospital follow up after being admitted by Dr Percival Spanish for CHF)   HPI Melvin Villarreal presents for follow-up on his CHF.  Melvin Villarreal is massive edema limited to Dr. Percival Spanish admitting him for diuresis.  Melvin Villarreal lost some 50 pounds in the hospital and 3-4 days.  Melvin Villarreal is now feeling incredibly improved.  And his massive scrotal edema previously described has resolved.  Melvin Villarreal is breathing better and feels like Melvin Villarreal has a new lease on life.  Depression screen [facility] 2/9 01/14/2018 12/31/2017 12/28/2017  Decreased Interest 0 0 0  Down, Depressed, Hopeless 0 0 0  PHQ - 2 Score 0 0 0    History Melvin Villarreal has a past medical history of Hypertension, Nephrolithiasis, and Seizures (Melvin Villarreal).   Melvin Villarreal has a past surgical history that includes Hand surgery (Left); Colonoscopy with propofol (N/A, 12/14/2017); and RIGHT/LEFT HEART CATH AND CORONARY ANGIOGRAPHY (N/A, 01/12/2018).   His family history includes Cancer (age of onset: 94) in his mother; Heart failure (age of onset: 85) in his brother.Melvin Villarreal reports that Melvin Villarreal has been smoking cigarettes.  Melvin Villarreal has a 52.50 pack-year smoking history. Melvin Villarreal has never used smokeless tobacco. Melvin Villarreal reports that Melvin Villarreal does not drink alcohol or use drugs.    ROS Review of Systems  Constitutional: Negative for fever.  Respiratory: Negative for shortness of breath.   Cardiovascular: Negative for chest pain.  Musculoskeletal: Negative for arthralgias.  Skin: Negative for rash.    Objective:  BP (!) 100/56   Pulse 93   Temp (!) 100.6 F (38.1 C) (Oral)   Ht 6\' 3"  (1.905 m)   Wt (!) 359 lb 8 oz (163.1 kg)   BMI 44.93 kg/m    BP Readings from Last 3 Encounters:  01/14/18 (!) 100/56  01/13/18 (!) 142/53  01/07/18 (!) 149/80    Wt Readings from Last 3 Encounters:  01/14/18 (!) 359 lb 8 oz (163.1 kg)  01/13/18 (!) 355 lb (161 kg)  01/07/18 (!) 404 lb 3.2 oz (183.3  kg)     Physical Exam  Constitutional: Melvin Villarreal is oriented to person, place, and time. Melvin Villarreal appears well-developed and well-nourished.  HENT:  Head: Normocephalic and atraumatic.  Right Ear: External ear normal.  Left Ear: External ear normal.  Eyes: EOM are normal. No scleral icterus.  Neck: Normal range of motion.  Cardiovascular: Normal rate, regular rhythm and normal heart sounds.  No murmur heard. Pulmonary/Chest: Effort normal and breath sounds normal. No respiratory distress.  Abdominal: Soft. There is no tenderness.  Musculoskeletal: Normal range of motion.  Neurological: Melvin Villarreal is alert and oriented to person, place, and time.  Psychiatric: Melvin Villarreal has a normal mood and affect.  Vitals reviewed.     Assessment & Plan:   Melvin Villarreal was seen today for hospitalization follow-up.  Diagnoses and all orders for this visit:  Acute on chronic systolic CHF (congestive heart failure) ([place])       I am having Melvin Villarreal maintain his lisinopril, phenytoin, amLODipine, Vitamin D (Ergocalciferol), meloxicam, acetaminophen, linaclotide, carvedilol, torsemide, spironolactone, potassium chloride SA, aspirin, and atorvastatin.  Allergies as of 01/14/2018   No Known Allergies     Medication List        Accurate as of 01/14/18 11:59 PM. Always use your most recent med list.          acetaminophen 650 MG CR  tablet Commonly known as:  TYLENOL Take 1,300 mg by mouth every 8 (eight) hours as needed for pain.   amLODipine 10 MG tablet Commonly known as:  NORVASC Take 1 tablet (10 mg total) by mouth daily.   aspirin 81 MG EC tablet Take 1 tablet (81 mg total) by mouth daily.   atorvastatin 80 MG tablet Commonly known as:  LIPITOR Take 1 tablet (80 mg total) by mouth daily at 6 PM.   carvedilol 3.125 MG tablet Commonly known as:  COREG Take 1 tablet (3.125 mg total) by mouth 2 (two) times daily with a meal.   linaclotide 290 MCG Caps capsule Commonly known as:  LINZESS Take 1  capsule (290 mcg total) by mouth daily before breakfast.   lisinopril 40 MG tablet Commonly known as:  PRINIVIL,ZESTRIL Take 40 mg by mouth daily.   meloxicam 15 MG tablet Commonly known as:  MOBIC TAKE 1 TABLET BY MOUTH EVERY DAY   phenytoin 100 MG ER capsule Commonly known as:  DILANTIN Take 700 mg by mouth at bedtime.   potassium chloride SA 20 MEQ tablet Commonly known as:  K-DUR,KLOR-CON Take 1 tablet (20 mEq total) by mouth 2 (two) times daily.   spironolactone 25 MG tablet Commonly known as:  ALDACTONE Take 1 tablet (25 mg total) by mouth daily.   torsemide 20 MG tablet Commonly known as:  DEMADEX Take 40 mg (2 tablets) in the morning and 20 mg (1 tablet) in the evening.   Vitamin D (Ergocalciferol) 50000 units Caps capsule Commonly known as:  DRISDOL Take 1 capsule (50,000 Units total) by mouth every 7 (seven) days.      I asked that the patient weigh himself when Melvin Villarreal gets home.  Should Melvin Villarreal gain more than 2 pounds in a day Melvin Villarreal should add 20 mg of torsemide to his regimen for that day.  If Melvin Villarreal loses 2 pounds from his baseline Melvin Villarreal should subtract 20 mg from that days regimen of Demadex.  Follow-up: Return in about 3 months (around 04/15/2018).  Claretta Fraise, M.D.

## 2018-01-16 ENCOUNTER — Encounter: Payer: Self-pay | Admitting: Family Medicine

## 2018-01-20 ENCOUNTER — Ambulatory Visit (INDEPENDENT_AMBULATORY_CARE_PROVIDER_SITE_OTHER): Payer: 59 | Admitting: Physician Assistant

## 2018-01-20 ENCOUNTER — Encounter: Payer: Self-pay | Admitting: Physician Assistant

## 2018-01-20 ENCOUNTER — Telehealth: Payer: Self-pay | Admitting: *Deleted

## 2018-01-20 VITALS — BP 98/58 | HR 70 | Ht 75.0 in | Wt 363.4 lb

## 2018-01-20 DIAGNOSIS — I5023 Acute on chronic systolic (congestive) heart failure: Secondary | ICD-10-CM

## 2018-01-20 DIAGNOSIS — I10 Essential (primary) hypertension: Secondary | ICD-10-CM

## 2018-01-20 DIAGNOSIS — R0683 Snoring: Secondary | ICD-10-CM | POA: Diagnosis not present

## 2018-01-20 DIAGNOSIS — I2510 Atherosclerotic heart disease of native coronary artery without angina pectoris: Secondary | ICD-10-CM

## 2018-01-20 LAB — BASIC METABOLIC PANEL
BUN / CREAT RATIO: 47 — AB (ref 9–20)
BUN: 40 mg/dL — AB (ref 6–24)
CO2: 27 mmol/L (ref 20–29)
Calcium: 8.6 mg/dL — ABNORMAL LOW (ref 8.7–10.2)
Chloride: 98 mmol/L (ref 96–106)
Creatinine, Ser: 0.86 mg/dL (ref 0.76–1.27)
GFR calc Af Amer: 113 mL/min/{1.73_m2} (ref >59)
GFR, EST NON AFRICAN AMERICAN: 98 mL/min/{1.73_m2} (ref >59)
Glucose: 100 mg/dL — ABNORMAL HIGH (ref 65–99)
POTASSIUM: 4.7 mmol/L (ref 3.5–5.2)
Sodium: 141 mmol/L (ref 134–144)

## 2018-01-20 NOTE — Patient Instructions (Addendum)
Decrease Lisinopril 40 mg take 1/2 tablet daily    Increase Demadex 20 mg  2 tablets twice a day for 5 days then return to normal dose       Lab work today ( bmet )     Your physician recommends that you schedule a follow-up appointment in: 3 to 4 weeks with Dr.Hochrein in [place] office

## 2018-01-20 NOTE — Progress Notes (Signed)
Cardiology Office Note   Date:  01/20/2018   ID:  ULAS ZUERCHER, DOB 21-Oct-1960, MRN [id number]  PCP:  Claretta Fraise, MD  Cardiologist: Dr. Percival Spanish, 01/07/2018 Rosaria Ferries, PA-C   No chief complaint on file.   History of Present Illness: Melvin Villarreal is a 57 y.o. male with a history of HTN, nephrolithiasis, seizures  Admitted 3/29-01/13/2018 for acute on chronic combined systolic and diastolic heart failure, CAD.[name]  Weight went from 402 pounds to 355 pounds during his hospital stay.  He almost certainly has sleep apnea, needs a sleep study.  [name] presents for cardiology follow up.  He is working on a low-sodium diet, struggles a bit with this. Is pretty sure he drinks more than 2L per day.   He has not had chest pain.   He is weighing himself daily, it is up and down, no big jumps, but has been increasing steadily.  He has not smoked since his admission.   His breathing is much better.   LE edema is chronic. He has had that a long time. Does not know if it is any worse.  He works nights, is wakened during sleep frequently by outside influences.  Has been sleeping in a recliner because his bed bothers his back.   Past Medical History:  Diagnosis Date  . Hypertension   . Nephrolithiasis   . Seizures ([place])     Past Surgical History:  Procedure Laterality Date  . COLONOSCOPY WITH PROPOFOL N/A 12/14/2017   Procedure: COLONOSCOPY WITH PROPOFOL;  Surgeon: Yetta Flock, MD;  Location: [facility];  Service: Gastroenterology;  Laterality: N/A;  . HAND SURGERY Left    Collapsed vein  . RIGHT/LEFT HEART CATH AND CORONARY ANGIOGRAPHY N/A 01/12/2018   Procedure: RIGHT/LEFT HEART CATH AND CORONARY ANGIOGRAPHY;  Surgeon: Sherren Mocha, MD;  Location: [place] [facility];  Service: Cardiovascular;  Laterality: N/A;    Current Outpatient Medications  Medication Sig Dispense Refill  . acetaminophen (TYLENOL) 650 MG CR tablet Take 1,300 mg by mouth  every 8 (eight) hours as needed for pain.    [name] amLODipine (NORVASC) 10 MG tablet Take 1 tablet (10 mg total) by mouth daily. (Patient taking differently: Take 10 mg by mouth at bedtime. ) 90 tablet 3  . aspirin EC 81 MG EC tablet Take 1 tablet (81 mg total) by mouth daily. 90 tablet 3  . atorvastatin (LIPITOR) 80 MG tablet Take 1 tablet (80 mg total) by mouth daily at 6 PM. 90 tablet 3  . carvedilol (COREG) 3.125 MG tablet Take 1 tablet (3.125 mg total) by mouth 2 (two) times daily with a meal. 60 tablet 6  . linaclotide (LINZESS) 290 MCG CAPS capsule Take 1 capsule (290 mcg total) by mouth daily before breakfast. 30 capsule 0  . lisinopril (PRINIVIL,ZESTRIL) 40 MG tablet Take 40 mg by mouth daily.    . meloxicam (MOBIC) 15 MG tablet TAKE 1 TABLET BY MOUTH EVERY DAY (Patient taking differently: Take 15 mg by mouth once a day) 30 tablet 2  . phenytoin (DILANTIN) 100 MG ER capsule Take 700 mg by mouth at bedtime.   0  . potassium chloride SA (K-DUR,KLOR-CON) 20 MEQ tablet Take 1 tablet (20 mEq total) by mouth 2 (two) times daily. 60 tablet 6  . spironolactone (ALDACTONE) 25 MG tablet Take 1 tablet (25 mg total) by mouth daily. 90 tablet 3  . torsemide (DEMADEX) 20 MG tablet Take 40 mg (2 tablets) in the  morning and 20 mg (1 tablet) in the evening. 90 tablet 6  . Vitamin D, Ergocalciferol, (DRISDOL) 50000 units CAPS capsule Take 1 capsule (50,000 Units total) by mouth every 7 (seven) days. (Patient taking differently: Take 50,000 Units by mouth every Tuesday. ) 12 capsule 3   No current facility-administered medications for this visit.     Allergies:   Patient has no known allergies.    Social History:  The patient  reports that he has been smoking cigarettes.  He has a 52.50 pack-year smoking history. He has never used smokeless tobacco. He reports that he does not drink alcohol or use drugs.   Family History:  The patient's family history includes Cancer (age of onset: 29) in his mother; Heart  failure (age of onset: 65) in his brother.    ROS:  Please see the history of present illness. All other systems are reviewed and negative.    PHYSICAL EXAM: VS:  BP (!) 98/58 (BP Location: Right Arm, Patient Position: Sitting, Cuff Size: Large)   Pulse 70   Ht 6\' 3"  (1.905 m)   Wt (!) 363 lb 6.4 oz (164.8 kg)   BMI 45.42 kg/m  , BMI Body mass index is 45.42 kg/m. GEN: Well nourished, well developed, male in no acute distress  HEENT: normal for age  Neck: JVD 9 cm, no carotid bruit, no masses Cardiac: RRR; 2-3/6 murmur, no rubs, or gallops Respiratory: Decreased breath sounds with a few scattered rales bilaterally, normal work of breathing GI: soft, nontender, nondistended, + BS MS: no deformity or atrophy; 2+ bilateral lower extremity edema; distal pulses are 2+ in all 4 extremities   Skin: warm and dry, no rash Neuro:  Strength and sensation are intact Psych: euthymic mood, full affect   EKG:  EKG is not ordered today.   Right and left heart cath 01/12/18:  Prox RCA lesion is 95% stenosed.  Mid RCA lesion is 95% stenosed.  Dist RCA lesion is 80% stenosed.  Mid LM to Dist LM lesion is 40% stenosed.  Ost LAD to Prox LAD lesion is 50% stenosed.  Prox LAD to Mid LAD lesion is 50% stenosed.  Prox Cx to Mid Cx lesion is 70% stenosed.  Ost 1st Mrg to 1st Mrg lesion is 30% stenosed.  The left ventricular ejection fraction is 45-50% by visual estimate.  LV end diastolic pressure is normal.  Hemodynamic findings consistent with moderate pulmonary hypertension.  LV end diastolic pressure is normal. 1. Moderate pulmonary hypertension with pulmonary vascular resistance approximately 3 Wood units 2. Multivessel coronary artery disease with chronic subtotal occlusion of the RCA with multiple severe stenoses and left to right collateral filling of the distal branch vessels, moderate irregular stenoses of the LAD, and moderate stenosis of the AV circumflex 3. Normal  LVEDP Recommendations: The patient appears to primarily have hemodynamic findings consistent with right heart failure. Fortunately his cardiac output is preserved. I would suggest aggressive medical therapy for his coronary artery disease. Diagnostic Diagram       Echo 12/29/17: Study Conclusions - Left ventricle: Diffuse hypokinesis with abnormal septal motion. The cavity size was normal. Wall thickness was increased in a pattern of moderate LVH. Systolic function was mildly to moderately reduced. The estimated ejection fraction was in the range of 40% to 45%. - Left atrium: The atrium was mildly dilated. - Right ventricle: The cavity size was moderately dilated. - Right atrium: The atrium was moderately dilated. - Atrial septum: No defect or patent foramen  ovale was identified. - Pulmonary arteries: PA peak pressure: 55 mm Hg (S)   Recent Labs: 01/07/2018: ALT 17; B Natriuretic Peptide 230.4; TSH 1.599 01/12/2018: Hemoglobin 13.4; Platelets 180 01/13/2018: BUN 15; Creatinine, Ser 0.69; Potassium 3.8; Sodium 137    Lipid Panel    Component Value Date/Time   CHOL 186 11/01/2017 0928   TRIG 64 11/01/2017 0928   HDL 46 11/01/2017 0928   CHOLHDL 4.0 11/01/2017 0928   LDLCALC 127 (H) 11/01/2017 0928     Wt Readings from Last 3 Encounters:  01/20/18 (!) 363 lb 6.4 oz (164.8 kg)  01/14/18 (!) 359 lb 8 oz (163.1 kg)  01/13/18 (!) 355 lb (161 kg)     Other studies Reviewed: Additional studies/ records that were reviewed today include: Office notes, hospital records and testing.  ASSESSMENT AND PLAN:  1.  Acute on chronic systolic CHF: He is working on eating a low-sodium diet, hopefully he will be compliant with this.  He is drinking more than 2 L daily, he is encouraged to cut this back to 2 L a day. -He has gained 8 pounds since discharge from the hospital. -We will increase the Demadex to 2 tablets twice a day for 5 days, then return back to his previous dose of 2  tablets a.m. and 1 tablet p.m. -Continue Aldactone 25 mg daily and potassium 20 mEq twice daily -Check a BMET today -He does not feel he has gotten his strength back, okay to give him more time out of work  2.  Hypertension: His blood pressure is on the low side of normal.  With the extra torsemide, do not think his blood pressure will tolerate it.  He may feel better if his blood pressures running a little higher. -Decrease lisinopril to 20 mg daily from 40 mg  3.  CAD: He is not having any ischemic symptoms. -He is on a beta-blocker, unable to uptitrated because of low blood pressure -Continue amlodipine, dose currently 10 mg.  If more beta-blockade is needed, may have to cut back on the amlodipine.  Because it has antianginal properties, will leave it for now. -He is not on Imdur, if he had blood pressure room, would add this.  4.  Snoring: He probably does have sleep apnea. -Because he works nights, his sleep is always interrupted when he is sleeping during the day. -He wishes to defer a sleep study for now, discuss it at his next office visit and possibly schedule it then.   Current medicines are reviewed at length with the patient today.  The patient does not have concerns regarding medicines.  The following changes have been made: Decrease lisinopril, temporarily increase torsemide  Labs/ tests ordered today include:   Orders Placed This Encounter  Procedures  . Basic metabolic panel     Disposition:   FU with Dr. Percival Spanish  Signed, Rosaria Ferries, PA-C  01/20/2018 12:53 PM    [place] Phone: [phone]; Fax: [phone]  This note was written with the assistance of speech recognition software. Please excuse any transcriptional errors.

## 2018-01-20 NOTE — Telephone Encounter (Signed)
Patient was seen by Melvin Villarreal today and declined scheduling a sleep study at this time. [name] asks if I will hold off at least 1 month. Patient has $$ concerns.

## 2018-01-24 ENCOUNTER — Other Ambulatory Visit: Payer: Self-pay | Admitting: Family Medicine

## 2018-01-24 ENCOUNTER — Other Ambulatory Visit: Payer: Self-pay

## 2018-01-24 DIAGNOSIS — I5023 Acute on chronic systolic (congestive) heart failure: Secondary | ICD-10-CM

## 2018-01-24 NOTE — Progress Notes (Signed)
Lab only 

## 2018-01-31 LAB — BASIC METABOLIC PANEL
BUN/Creatinine Ratio: 25 — ABNORMAL HIGH (ref 9–20)
BUN: 18 mg/dL (ref 6–24)
CALCIUM: 9.3 mg/dL (ref 8.7–10.2)
CO2: 31 mmol/L — ABNORMAL HIGH (ref 20–29)
Chloride: 92 mmol/L — ABNORMAL LOW (ref 96–106)
Creatinine, Ser: 0.73 mg/dL — ABNORMAL LOW (ref 0.76–1.27)
GFR calc non Af Amer: 104 mL/min/{1.73_m2} (ref >59)
GFR, EST AFRICAN AMERICAN: 121 mL/min/{1.73_m2} (ref >59)
Glucose: 150 mg/dL — ABNORMAL HIGH (ref 65–99)
Potassium: 4.6 mmol/L (ref 3.5–5.2)
Sodium: 139 mmol/L (ref 134–144)

## 2018-02-02 ENCOUNTER — Other Ambulatory Visit: Payer: Self-pay | Admitting: Family Medicine

## 2018-02-18 ENCOUNTER — Ambulatory Visit: Payer: 59 | Admitting: Cardiology

## 2018-04-19 ENCOUNTER — Ambulatory Visit: Payer: 59 | Admitting: Family Medicine

## 2018-04-19 ENCOUNTER — Encounter: Payer: Self-pay | Admitting: Family Medicine

## 2018-04-19 VITALS — BP 98/58 | HR 82 | Temp 97.5°F | Ht 75.0 in | Wt 365.1 lb

## 2018-04-19 DIAGNOSIS — I5023 Acute on chronic systolic (congestive) heart failure: Secondary | ICD-10-CM | POA: Diagnosis not present

## 2018-04-19 DIAGNOSIS — M1711 Unilateral primary osteoarthritis, right knee: Secondary | ICD-10-CM

## 2018-04-19 DIAGNOSIS — E785 Hyperlipidemia, unspecified: Secondary | ICD-10-CM

## 2018-04-19 MED ORDER — CELECOXIB 200 MG PO CAPS: 200.0000 mg | ORAL_CAPSULE | Freq: Every day | ORAL | 5 refills | Status: DC

## 2018-04-19 MED ORDER — LISINOPRIL 40 MG PO TABS: ORAL_TABLET | ORAL | 5 refills | Status: AC

## 2018-04-19 MED ORDER — LINACLOTIDE 290 MCG PO CAPS: 290.0000 ug | ORAL_CAPSULE | Freq: Every day | ORAL | 5 refills | Status: DC

## 2018-04-19 NOTE — Progress Notes (Signed)
Subjective:  Patient ID: Melvin Villarreal, male    DOB: 1960-12-06  Age: 57 y.o. MRN: [id number]  CC: Medical Management of Chronic Issues   HPI Melvin Villarreal presents for recheck of his congestive heart failure.  Patient recently was admitted to [facility] hospital for diuresis and lost over 50 pounds in several days.  He is started gaining back just a tad bit of that up about 10 pounds in the last few weeks.  He denies shortness of breath and fatigue currently.  He is noting just a bit of fluid accumulating in the lower legs.  Today he also notes that he is having right knee pain 8-9/10 pain that feels like a knife.  It gives way on him as well.  This has not caused a fall but has had to catch himself several times.  Depression screen [facility] 2/9 01/14/2018 12/31/2017 12/28/2017  Decreased Interest 0 0 0  Down, Depressed, Hopeless 0 0 0  PHQ - 2 Score 0 0 0    History Melvin Villarreal has a past medical history of Hypertension, Nephrolithiasis, NEPHROLITHIASIS, HX OF (12/28/2008), and Seizures ([place]).   He has a past surgical history that includes Hand surgery (Left); Colonoscopy with propofol (N/A, 12/14/2017); and RIGHT/LEFT HEART CATH AND CORONARY ANGIOGRAPHY (N/A, 01/12/2018).   His family history includes Cancer (age of onset: 8) in his mother; Heart failure (age of onset: 83) in his brother.He reports that he has been smoking cigarettes.  He has a 52.50 pack-year smoking history. He has never used smokeless tobacco. He reports that he does not drink alcohol or use drugs.    ROS Review of Systems  Constitutional: Negative.   HENT: Negative.   Eyes: Negative for visual disturbance.  Respiratory: Negative for cough and shortness of breath.   Cardiovascular: Negative for chest pain and leg swelling.  Gastrointestinal: Positive for constipation. Negative for abdominal pain, diarrhea, nausea and vomiting.  Genitourinary: Negative for difficulty urinating.  Musculoskeletal: Positive for arthralgias. Negative  for myalgias.  Skin: Negative for rash.  Neurological: Negative for headaches.  Psychiatric/Behavioral: Negative for sleep disturbance.    Objective:  BP (!) 98/58   Pulse 82   Temp (!) 97.5 F (36.4 C) (Oral)   Ht '6\' 3"'$  (1.905 m)   Wt (!) 365 lb 2 oz (165.6 kg)   BMI 45.64 kg/m   BP Readings from Last 3 Encounters:  04/19/18 (!) 98/58  01/20/18 (!) 98/58  01/14/18 (!) 100/56    Wt Readings from Last 3 Encounters:  04/19/18 (!) 365 lb 2 oz (165.6 kg)  01/20/18 (!) 363 lb 6.4 oz (164.8 kg)  01/14/18 (!) 359 lb 8 oz (163.1 kg)     Physical Exam  Constitutional: He is oriented to person, place, and time. He appears well-developed and well-nourished. No distress.  HENT:  Head: Normocephalic and atraumatic.  Right Ear: External ear normal.  Left Ear: External ear normal.  Nose: Nose normal.  Mouth/Throat: Oropharynx is clear and moist.  Eyes: Pupils are equal, round, and reactive to light. Conjunctivae and EOM are normal.  Neck: Normal range of motion. Neck supple.  Cardiovascular: Normal rate, regular rhythm and normal heart sounds.  No murmur heard. Pulmonary/Chest: Effort normal and breath sounds normal. No respiratory distress. He has no wheezes. He has no rales.  Abdominal: Soft. There is no tenderness.  Musculoskeletal: Normal range of motion.  Neurological: He is alert and oriented to person, place, and time. He has normal reflexes.  Skin: Skin is  warm and dry.  Psychiatric: He has a normal mood and affect. His behavior is normal. Judgment and thought content normal.      Assessment & Plan:   Melvin Villarreal was seen today for medical management of chronic issues.  Diagnoses and all orders for this visit:  Acute on chronic systolic CHF (congestive heart failure) ([place]) -     CMP14+EGFR  Hyperlipidemia, unspecified hyperlipidemia type -     CMP14+EGFR  Arthritis of right knee -     Ambulatory referral to Orthopedic Surgery -     Ambulatory referral to Physical  Therapy  Morbid obesity ([place])  Other orders -     lisinopril (PRINIVIL,ZESTRIL) 40 MG tablet; TAKE 1 TABLET BY MOUTH EVERY DAY -     linaclotide (LINZESS) 290 MCG CAPS capsule; Take 1 capsule (290 mcg total) by mouth daily before breakfast. -     celecoxib (CELEBREX) 200 MG capsule; Take 1 capsule (200 mg total) by mouth daily. -     Specimen Status       I have discontinued Melvin Villarreal's meloxicam, atorvastatin, and furosemide. I am also having him start on celecoxib. Additionally, I am having him maintain his phenytoin, amLODipine, Vitamin D (Ergocalciferol), acetaminophen, carvedilol, torsemide, spironolactone, potassium chloride SA, aspirin, lisinopril, and linaclotide.  Allergies as of 04/19/2018   No Known Allergies     Medication List        Accurate as of 04/19/18 11:59 PM. Always use your most recent med list.          acetaminophen 650 MG CR tablet Commonly known as:  TYLENOL Take 1,300 mg by mouth every 8 (eight) hours as needed for pain.   amLODipine 10 MG tablet Commonly known as:  NORVASC Take 1 tablet (10 mg total) by mouth daily.   aspirin 81 MG EC tablet Take 1 tablet (81 mg total) by mouth daily.   carvedilol 3.125 MG tablet Commonly known as:  COREG Take 1 tablet (3.125 mg total) by mouth 2 (two) times daily with a meal.   celecoxib 200 MG capsule Commonly known as:  CELEBREX Take 1 capsule (200 mg total) by mouth daily.   linaclotide 290 MCG Caps capsule Commonly known as:  LINZESS Take 1 capsule (290 mcg total) by mouth daily before breakfast.   lisinopril 40 MG tablet Commonly known as:  PRINIVIL,ZESTRIL TAKE 1 TABLET BY MOUTH EVERY DAY   phenytoin 100 MG ER capsule Commonly known as:  DILANTIN Take 700 mg by mouth at bedtime.   potassium chloride SA 20 MEQ tablet Commonly known as:  K-DUR,KLOR-CON Take 1 tablet (20 mEq total) by mouth 2 (two) times daily.   spironolactone 25 MG tablet Commonly known as:  ALDACTONE Take 1 tablet  (25 mg total) by mouth daily.   torsemide 20 MG tablet Commonly known as:  DEMADEX Take 40 mg (2 tablets) in the morning and 20 mg (1 tablet) in the evening.   Vitamin D (Ergocalciferol) 50000 units Caps capsule Commonly known as:  DRISDOL Take 1 capsule (50,000 Units total) by mouth every 7 (seven) days.      Weight loss as a method of pain control for the knee was discussed.  Weight loss strategies including heart healthy low calorie diet and regular exercise recommended.  Patient has cardiologist follow-up upcoming. Follow-up: Return in about 3 months (around 07/20/2018), or if symptoms worsen or fail to improve.  Claretta Fraise, M.D.

## 2018-04-19 NOTE — Patient Instructions (Signed)

## 2018-04-20 LAB — CMP14+EGFR
ALBUMIN: 4.2 g/dL (ref 3.5–5.5)
ALK PHOS: 82 IU/L (ref 39–117)
ALT: 12 IU/L (ref 0–44)
AST: 13 IU/L (ref 0–40)
Albumin/Globulin Ratio: 1.4 (ref 1.2–2.2)
BILIRUBIN TOTAL: 0.2 mg/dL (ref 0.0–1.2)
BUN / CREAT RATIO: 32 — AB (ref 9–20)
BUN: 43 mg/dL — ABNORMAL HIGH (ref 6–24)
CO2: 25 mmol/L (ref 20–29)
CREATININE: 1.36 mg/dL — AB (ref 0.76–1.27)
Calcium: 9.6 mg/dL (ref 8.7–10.2)
Chloride: 98 mmol/L (ref 96–106)
GFR calc non Af Amer: 58 mL/min/{1.73_m2} — ABNORMAL LOW (ref >59)
GFR, EST AFRICAN AMERICAN: 67 mL/min/{1.73_m2} (ref >59)
GLOBULIN, TOTAL: 3.1 g/dL (ref 1.5–4.5)
Glucose: 102 mg/dL — ABNORMAL HIGH (ref 65–99)
Potassium: 4.7 mmol/L (ref 3.5–5.2)
SODIUM: 140 mmol/L (ref 134–144)
Total Protein: 7.3 g/dL (ref 6.0–8.5)

## 2018-04-20 LAB — SPECIMEN STATUS

## 2018-04-21 ENCOUNTER — Encounter: Payer: Self-pay | Admitting: Family Medicine

## 2018-04-23 NOTE — Progress Notes (Signed)
Cardiology Office Note   Date:  04/25/2018   ID:  CLIMMIE BUELOW, DOB 12/01/1960, MRN [id number]  PCP:  Claretta Fraise, MD  Cardiologist:   Minus Breeding, MD Referring:  Claretta Fraise, MD  Chief Complaint  Patient presents with  . Shortness of Breath      History of Present Illness: Melvin Villarreal is a 57 y.o. male who is referred by Claretta Fraise, MD for evaluation of systolic and diastolic HF.  He was admitted in March for this.  He had cath with disease listed below.   He is managed medically.  Since he was last seen he stopped smoking.  He is gained weight probably because of this.  He actually feels relatively well.  He has much less swelling than he used to have.  He denies any acute shortness of breath, PND or orthopnea.  He said no new chest pressure, neck or arm discomfort.  He is reduced his salt and fluid intake.   He is off of Lipitor because of muscle aches.     Past Medical History:  Diagnosis Date  . Hypertension   . Nephrolithiasis   . NEPHROLITHIASIS, HX OF 12/28/2008   Qualifier: Diagnosis of  By: Marland Mcalpine    . Seizures ([place])     Past Surgical History:  Procedure Laterality Date  . COLONOSCOPY WITH PROPOFOL N/A 12/14/2017   Procedure: COLONOSCOPY WITH PROPOFOL;  Surgeon: Yetta Flock, MD;  Location: [facility];  Service: Gastroenterology;  Laterality: N/A;  . HAND SURGERY Left    Collapsed vein  . RIGHT/LEFT HEART CATH AND CORONARY ANGIOGRAPHY N/A 01/12/2018   Procedure: RIGHT/LEFT HEART CATH AND CORONARY ANGIOGRAPHY;  Surgeon: Sherren Mocha, MD;  Location: [place] [facility];  Service: Cardiovascular;  Laterality: N/A;     Current Outpatient Medications  Medication Sig Dispense Refill  . acetaminophen (TYLENOL) 650 MG CR tablet Take 1,300 mg by mouth every 8 (eight) hours as needed for pain.    [name] amLODipine (NORVASC) 5 MG tablet Take 1 tablet (5 mg total) by mouth at bedtime. 30 tablet 6  . aspirin EC 81 MG EC tablet Take 1  tablet (81 mg total) by mouth daily. 90 tablet 3  . carvedilol (COREG) 6.25 MG tablet Take 1 tablet (6.25 mg total) by mouth 2 (two) times daily with a meal. 60 tablet 6  . celecoxib (CELEBREX) 200 MG capsule Take 1 capsule (200 mg total) by mouth daily. 30 capsule 5  . linaclotide (LINZESS) 290 MCG CAPS capsule Take 1 capsule (290 mcg total) by mouth daily before breakfast. 30 capsule 5  . lisinopril (PRINIVIL,ZESTRIL) 40 MG tablet TAKE 1 TABLET BY MOUTH EVERY DAY 30 tablet 5  . phenytoin (DILANTIN) 100 MG ER capsule Take 700 mg by mouth at bedtime.   0  . potassium chloride SA (K-DUR,KLOR-CON) 20 MEQ tablet Take 1 tablet (20 mEq total) by mouth 2 (two) times daily. 60 tablet 6  . spironolactone (ALDACTONE) 25 MG tablet Take 1 tablet (25 mg total) by mouth daily. 90 tablet 3  . torsemide (DEMADEX) 20 MG tablet Take 40 mg (2 tablets) in the morning and 20 mg (1 tablet) in the evening. 90 tablet 6  . Vitamin D, Ergocalciferol, (DRISDOL) 50000 units CAPS capsule Take 1 capsule (50,000 Units total) by mouth every 7 (seven) days. (Patient taking differently: Take 50,000 Units by mouth every Tuesday. ) 12 capsule 3  . pravastatin (PRAVACHOL) 80 MG tablet Take 1 tablet (80  mg total) by mouth every evening. 30 tablet 6   No current facility-administered medications for this visit.     Allergies:   Patient has no known allergies.    ROS:  As stated in the HPI and negative for all other systems.  PHYSICAL EXAM: VS:  BP 106/70   Pulse 80   Ht 6\' 3"  (1.905 m)   Wt (!) 367 lb 9.6 oz (166.7 kg)   SpO2 98%   BMI 45.95 kg/m  , BMI Body mass index is 45.95 kg/m.  GENERAL:  Well appearing NECK:  No jugular venous distention, waveform within normal limits, carotid upstroke brisk and symmetric, no bruits, no thyromegaly LUNGS:  Clear to auscultation bilaterally CHEST:  Unremarkable HEART:  PMI not displaced or sustained,S1 and S2 within normal limits, no S3, no S4, no clicks, no rubs, no murmurs ABD:   Flat, positive bowel sounds normal in frequency in pitch, no bruits, no rebound, no guarding, no midline pulsatile mass, no hepatomegaly, no splenomegaly EXT:  2 plus pulses throughout, moderate leg edema, no cyanosis no clubbing   EKG:  EKG is not  ordered today.  Cath  Right and left heart cath 01/12/18:  Prox RCA lesion is 95% stenosed.  Mid RCA lesion is 95% stenosed.  Dist RCA lesion is 80% stenosed.  Mid LM to Dist LM lesion is 40% stenosed.  Ost LAD to Prox LAD lesion is 50% stenosed.  Prox LAD to Mid LAD lesion is 50% stenosed.  Prox Cx to Mid Cx lesion is 70% stenosed.  Ost 1st Mrg to 1st Mrg lesion is 30% stenosed.  The left ventricular ejection fraction is 45-50% by visual estimate.  LV end diastolic pressure is normal.  Hemodynamic findings consistent with moderate pulmonary hypertension.  LV end diastolic pressure is normal.     Recent Labs: 01/07/2018: B Natriuretic Peptide 230.4; TSH 1.599 04/19/2018: ALT 12; BUN 43; Creatinine, Ser 1.36; Hemoglobin WILL FOLLOW; Platelets WILL FOLLOW; Potassium 4.7; Sodium 140    Lipid Panel    Component Value Date/Time   CHOL 186 11/01/2017 0928   TRIG 64 11/01/2017 0928   HDL 46 11/01/2017 0928   CHOLHDL 4.0 11/01/2017 0928   LDLCALC 127 (H) 11/01/2017 0928      Wt Readings from Last 3 Encounters:  04/25/18 (!) 367 lb 9.6 oz (166.7 kg)  04/19/18 (!) 365 lb 2 oz (165.6 kg)  01/20/18 (!) 363 lb 6.4 oz (164.8 kg)      Other studies Reviewed: Additional studies/ records that were reviewed today include: Office records, echo. Review of the above records demonstrates:  Please see elsewhere in the note.     ASSESSMENT AND PLAN:  CHRONIC SYSTOLIC AND DIASTOLIC HF:    I am going to continue to titrate his medications.  I would like to reduce his Norvasc to 5 mg and try to increase his carvedilol to 6.25 twice a day.  We will see him back in a few weeks for further med titration.  CAD: He is going to have this  managed medically.  He needs continued risk reduction as below.  SNORING: He has refused a sleep study.  However, I talked to him about this again today and we are going to try to reschedule.  DYSLIPIDEMIA: He has been taken off of Lipitor.  However, with his extensive disease I am going to try pravastatin 80 mg daily.  Hopefully he will get muscle aches with this.  HTN:  This is being managed in  the context of treating his CHF  MORBID OBESITY:  He understands the need to start to lose weight.     TOBACCO ABUSE:  I am very proud of his quitting smoking.  We talked about continued abstinence.  Current medicines are reviewed at length with the patient today.  The patient does not have concerns regarding medicines.  The following changes have been made:  As above.  Labs/ tests ordered today include:  No orders of the defined types were placed in this encounter.    Disposition:   FU with Rhonda Barrett, PAc in one month.     Signed, Minus Breeding, MD  04/25/2018 11:32 AM    [facility]

## 2018-04-25 ENCOUNTER — Encounter: Payer: Self-pay | Admitting: Cardiology

## 2018-04-25 ENCOUNTER — Ambulatory Visit (INDEPENDENT_AMBULATORY_CARE_PROVIDER_SITE_OTHER): Payer: 59 | Admitting: Cardiology

## 2018-04-25 VITALS — BP 106/70 | HR 80 | Ht 75.0 in | Wt 367.6 lb

## 2018-04-25 DIAGNOSIS — I2510 Atherosclerotic heart disease of native coronary artery without angina pectoris: Secondary | ICD-10-CM | POA: Diagnosis not present

## 2018-04-25 DIAGNOSIS — E785 Hyperlipidemia, unspecified: Secondary | ICD-10-CM | POA: Diagnosis not present

## 2018-04-25 DIAGNOSIS — I5043 Acute on chronic combined systolic (congestive) and diastolic (congestive) heart failure: Secondary | ICD-10-CM

## 2018-04-25 DIAGNOSIS — I10 Essential (primary) hypertension: Secondary | ICD-10-CM

## 2018-04-25 MED ORDER — CARVEDILOL 6.25 MG PO TABS: 6.2500 mg | ORAL_TABLET | Freq: Two times a day (BID) | ORAL | 6 refills | Status: DC

## 2018-04-25 MED ORDER — AMLODIPINE BESYLATE 5 MG PO TABS: 5.0000 mg | ORAL_TABLET | Freq: Every day | ORAL | 6 refills | Status: DC

## 2018-04-25 MED ORDER — PRAVASTATIN SODIUM 80 MG PO TABS: 80.0000 mg | ORAL_TABLET | Freq: Every evening | ORAL | 6 refills | Status: DC

## 2018-04-25 NOTE — Patient Instructions (Signed)
Medication Instructions:  START- Pravastatin 80 mg daily DECREASE- Amlodipine 5 mg daily INCREASE- Carvedilol 6.25 mg twice a day  If you need a refill on your cardiac medications before your next appointment, please call your pharmacy.  Labwork: None Ordered   Testing/Procedures: None Ordered   Follow-Up: Your physician wants you to follow-up in: 1 Month with Rhonda Barrett.      Thank you for choosing [facility] at [facility]!!

## 2018-04-26 ENCOUNTER — Other Ambulatory Visit: Payer: Self-pay

## 2018-04-26 ENCOUNTER — Ambulatory Visit: Payer: 59 | Attending: Family Medicine | Admitting: Physical Therapy

## 2018-04-26 ENCOUNTER — Encounter: Payer: Self-pay | Admitting: Physical Therapy

## 2018-04-26 DIAGNOSIS — M25661 Stiffness of right knee, not elsewhere classified: Secondary | ICD-10-CM | POA: Insufficient documentation

## 2018-04-26 DIAGNOSIS — M25561 Pain in right knee: Secondary | ICD-10-CM | POA: Insufficient documentation

## 2018-04-26 DIAGNOSIS — G8929 Other chronic pain: Secondary | ICD-10-CM | POA: Insufficient documentation

## 2018-04-26 NOTE — Therapy (Signed)
[facility] [place], [place], [address] Phone: [phone]   Fax:  [phone]  Physical Therapy Evaluation  Patient Details  Name: Melvin Villarreal MRN: [id number] Date of Birth: 17-Sep-1961 Referring Provider: Claretta Fraise   Encounter Date: 04/26/2018  PT End of Session - 04/26/18 0858    Visit Number  1    Number of Visits  16    Date for PT Re-Evaluation  06/21/18    PT Start Time  0900    PT Stop Time  0941    PT Time Calculation (min)  41 min    Activity Tolerance  Patient tolerated treatment well    Behavior During Therapy  [facility] for tasks assessed/performed       Past Medical History:  Diagnosis Date  . Hypertension   . Nephrolithiasis   . NEPHROLITHIASIS, HX OF 12/28/2008   Qualifier: Diagnosis of  By: Marland Mcalpine    . Seizures ([place])     Past Surgical History:  Procedure Laterality Date  . COLONOSCOPY WITH PROPOFOL N/A 12/14/2017   Procedure: COLONOSCOPY WITH PROPOFOL;  Surgeon: Yetta Flock, MD;  [facility]: [facility];  Service: Gastroenterology;  Laterality: N/A;  . HAND SURGERY Left    Collapsed vein  . RIGHT/LEFT HEART CATH AND CORONARY ANGIOGRAPHY N/A 01/12/2018   Procedure: RIGHT/LEFT HEART CATH AND CORONARY ANGIOGRAPHY;  Surgeon: Sherren Mocha, MD;  Location: [place] [facility];  Service: Cardiovascular;  Laterality: N/A;    There were no vitals filed for this visit.   Subjective Assessment - 04/26/18 0909    Subjective  Since December if patient has been sitting for a while and he goes to get up, it feels like someone is sticking a knife in it. He did some water exercise in the pool on 04/14/18 and he overdid it and could hardly walk for two days. At his MD appt the knee popped and it felt better.    Pertinent History  Heart problems, epilepsy    Diagnostic tests  none    Patient Stated Goals  get rid of pain    Currently in Pain?  No/denies 8.5-9/10 when he first stands    Pain  Location  Knee    Pain Orientation  Right    Pain Descriptors / Indicators  Stabbing    Pain Type  Chronic pain    Pain Onset  More than a month ago    Pain Frequency  Intermittent    Aggravating Factors   sit to stand transition    Pain Relieving Factors  take weight off of it and move it around    Effect of Pain on Daily Activities  limits walking         [facility] PT Assessment - 04/26/18 0001      Assessment   Medical Diagnosis  arthritis of R knee    Referring Provider  Cletus Gash Stacks    Onset Date/Surgical Date  09/25/17    Next MD Visit  October      Precautions   Precautions  None      Balance Screen   Has the patient fallen in the past 6 months  No    Has the patient had a decrease in activity level because of a fear of falling?   No    Is the patient reluctant to leave their home because of a fear of falling?   No      Prior Function   Level of Independence  Independent    Vocation  Full time employment    Vocation Requirements  bending, squatting, crawling      Observation/Other Assessments   Focus on Therapeutic Outcomes (FOTO)   57% LIMITED      ROM / Strength   AROM / PROM / Strength  AROM;Strength      AROM   Overall AROM Comments  0-105 deg R knee; prone knee flex R 95 deg      Strength   Overall Strength Comments  RLE 5/5      Flexibility   Soft Tissue Assessment /Muscle Length  yes    Hamstrings  WNL    Quadriceps  tight Bil and R HF    ITB  + ober R      Palpation   Patella mobility  WNL    Palpation comment  tender at lateral knee joint line R      Special Tests    Special Tests  Meniscus Tests    Meniscus Tests  Apley's Compression;Apley's Distraction      Apley's Compression   Findings  Negative    Side   Right      Apley's Distraction   Findings  Positive    Side  Right                Objective measurements completed on examination: See above findings.                PT Short Term Goals - 04/26/18 0953       PT SHORT TERM GOAL #1   Title  Ind and compliant with HEP for flexibility    Time  3    Period  Weeks    Status  New    Target Date  05/17/18        PT Long Term Goals - 04/26/18 0954      PT LONG TERM GOAL #1   Title  Patient able to perform sit to stand transition without pain in R knee.    Time  8    Period  Weeks    Status  New    Target Date  06/21/18      PT LONG TERM GOAL #2   Title  Patient able to ambulate community distances with 3/10 pain or less.    Time  8    Period  Weeks    Status  New      PT LONG TERM GOAL #3   Title  Patient able to walk down a ramp with minimal pain in the R knee.    Time  8    Period  Weeks    Status  New      PT LONG TERM GOAL #4   Title  Patient to demo negative Ober test on R knee to prevent further injury to knee.    Time  8    Period  Weeks    Status  New             Plan - 04/26/18 0946    Clinical Impression Statement  Patient presents for low complexity evaluation for R knee pain. His signs and symptoms are consistent with a meniscus injury. He has limited ROM and flexibility deficits. He has difficulty with walking and sit to stand transition and walking down inclines. Patient will benefit from PT to address these deficits.    History and Personal Factors relevant to plan of care:  obesity    Clinical Presentation  Stable    Clinical Decision Making  Low    Rehab Potential  Good    PT Frequency  2x / week    PT Duration  8 weeks    PT Treatment/Interventions  ADLs/Self Care Home Management;Cryotherapy;Electrical Stimulation;Iontophoresis 4mg /ml Dexamethasone;Moist Heat;Ultrasound;Stair training;Therapeutic exercise;Neuromuscular re-education;Patient/family education;Manual techniques;Dry needling;Taping;Vasopneumatic Device    PT Next Visit Plan  flexibility; Painfree quad strenghening level 1; joint traction;     PT Home Exercise Plan  stretches: itb, quads, HF    Consulted and Agree with Plan of Care  Patient        Patient will benefit from skilled therapeutic intervention in order to improve the following deficits and impairments:  Pain, Impaired flexibility, Difficulty walking, Decreased range of motion  Visit Diagnosis: Stiffness of right knee, not elsewhere classified - Plan: PT plan of care cert/re-cert  Chronic pain of right knee - Plan: PT plan of care cert/re-cert     Problem List Patient Active Problem List   Diagnosis Date Noted  . Coronary artery disease involving native coronary artery of native heart without angina pectoris 04/25/2018  . Dyslipidemia 04/25/2018  . Acute on chronic systolic CHF (congestive heart failure) ([place]) 01/07/2018  . Benign neoplasm of ascending colon   . Benign neoplasm of transverse colon   . Benign neoplasm of descending colon   . Benign neoplasm of sigmoid colon   . Benign neoplasm of colon   . Vitamin D deficiency 11/02/2017  . Current smoker 09/28/2017  . Morbid obesity ([place]) 09/28/2017  . Peripheral edema 09/28/2017  . Hypertension 09/28/2017  . Heavy cigarette smoker (20-39 per day) 11/15/2015  . Hyperlipemia 12/28/2008  . GASTRIC ULCER, ACUTE, HEMORRHAGE 12/28/2008  . 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 Seizure disorder ([place]) 12/28/2008    Kaliel Bolds PT 04/26/2018, 10:50 AM  [facility] [address] [place], [place], [address] Phone: [phone]   Fax:  [phone]  Name: Melvin Villarreal MRN: [id number] Date of Birth: 01-19-1961

## 2018-04-26 NOTE — Patient Instructions (Signed)
Quadriceps (Prone)   On stomach with sheet around ankles, knees together, hips down, pull heels toward bottom. Keep hips flat. Hold __60__ seconds. Repeat _3__ times. Do __3__ sessions per day. CAUTION: Stretch should be gentle, steady and slow.    Outer Hip Stretch: Reclined IT Band Stretch (Strap)   Strap around opposite foot, pull across only as far as possible with shoulders on mat. Hold for __60__ seconds. Repeat __3__ times each leg. 2-3 x/day.  OR  Iliotibial Band Stretch, Side-Lying   Lie on side, back to edge of bed, top arm in front. Allow top leg to drape behind over edge. Hold 60 or more seconds.  Repeat 3 times per session. Do _2-3 sessions per day.    HIP: Flexors - Supine   Lie on edge of surface. Place leg off the surface, allow knee to bend. Bring other knee toward chest. Hold _60__ seconds. _3__ reps per set, _2-3__ times per day, Rest lowered foot on stool if needed.   Quads / HF, Standing   Stand, holding onto chair and grasping one foot with other-side hand. Pull heel toward buttock until stretch is felt in front of thigh. Hold _30-60__ seconds.  Repeat _3__ times per session. Do __2-3_ sessions per day. Copyright  V[employer]. All rights reserved.    Madelyn Flavors, PT 04/26/18 9:41 AM; [facility] [place], [place], [address] Phone: [phone]   Fax:  [phone]

## 2018-04-28 ENCOUNTER — Encounter: Payer: Self-pay | Admitting: Physical Therapy

## 2018-04-28 ENCOUNTER — Ambulatory Visit: Payer: 59 | Admitting: Physical Therapy

## 2018-04-28 DIAGNOSIS — M25661 Stiffness of right knee, not elsewhere classified: Secondary | ICD-10-CM

## 2018-04-28 DIAGNOSIS — M25561 Pain in right knee: Secondary | ICD-10-CM

## 2018-04-28 DIAGNOSIS — G8929 Other chronic pain: Secondary | ICD-10-CM

## 2018-04-28 NOTE — Therapy (Signed)
[facility] [place], [place], [address] Phone: [phone]   Fax:  [phone]  Physical Therapy Treatment  Patient Details  Name: Melvin Villarreal MRN: [id number] Date of Birth: [birth year] Referring Provider: Claretta Fraise   Encounter Date: 04/28/2018  PT End of Session - 04/28/18 0822    Visit Number  2    Number of Visits  16    Date for PT Re-Evaluation  06/21/18    PT Start Time  0818    PT Stop Time  0907    PT Time Calculation (min)  49 min    Activity Tolerance  Patient tolerated treatment well    Behavior During Therapy  [facility] for tasks assessed/performed       Past Medical History:  Diagnosis Date  . Hypertension   . Nephrolithiasis   . NEPHROLITHIASIS, HX OF 12/28/2008   Qualifier: Diagnosis of  By: Marland Mcalpine    . Seizures ([place])     Past Surgical History:  Procedure Laterality Date  . COLONOSCOPY WITH PROPOFOL N/A 12/14/2017   Procedure: COLONOSCOPY WITH PROPOFOL;  Surgeon: Yetta Flock, MD;  [facility];  Service: Gastroenterology;  Laterality: N/A;  . HAND SURGERY Left    Collapsed vein  . RIGHT/LEFT HEART CATH AND CORONARY ANGIOGRAPHY N/A 01/12/2018   Procedure: RIGHT/LEFT HEART CATH AND CORONARY ANGIOGRAPHY;  Surgeon: Sherren Mocha, MD;  Location: [place] [facility];  Service: Cardiovascular;  Laterality: N/A;    There were no vitals filed for this visit.  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    Pertinent History  Heart problems, epilepsy    Diagnostic tests  none    Patient Stated Goals  get rid of pain    Currently in Pain?  Yes    Pain Score  7     Pain Location  Knee    Pain Orientation  Right    Pain Descriptors / Indicators  Throbbing    Pain Type  Chronic pain    Pain Onset  More than a month ago    Pain Frequency  Intermittent    Aggravating Factors   sit to stand transition          [facility] PT Assessment - 04/28/18 0001      Assessment   Medical Diagnosis  arthritis of R knee    Onset Date/Surgical Date  09/25/17    Next MD Visit  October 05/02/2018 Dr. Lajuana Ripple      Precautions   Precautions  None                   [facility] Adult PT Treatment/Exercise - 04/28/18 0001      Exercises   Exercises  Knee/Hip      Knee/Hip Exercises: Stretches   Passive Hamstring Stretch  Right;1 rep;30 seconds    Hip Flexor Stretch  Right;3 reps;60 seconds    ITB Stretch  Right;3 reps;30 seconds    Piriformis Stretch  Right;3 reps;30 seconds      Knee/Hip Exercises: Aerobic   Nustep  L4 x11 min      Knee/Hip Exercises: Supine   Short Arc Quad Sets  AROM;Right;10 reps + pain    Heel Slides  AROM;Right + pain    Straight Leg Raises  AROM;Right;15 reps      Modalities   Modalities  Electrical Stimulation;Vasopneumatic  Ambulance person  IFC    Electrical Stimulation Parameters  80-150 hz x15 min    Electrical Stimulation Goals  Pain;Edema      Vasopneumatic   Number Minutes Vasopneumatic   15 minutes    Vasopnuematic Location   Knee    Vasopneumatic Pressure  Low    Vasopneumatic Temperature   34               PT Short Term Goals - 04/26/18 0953      PT SHORT TERM GOAL #1   Title  Ind and compliant with HEP for flexibility    Time  3    Period  Weeks    Status  New    Target Date  05/17/18        PT Long Term Goals - 04/26/18 0954      PT LONG TERM GOAL #1   Title  Patient able to perform sit to stand transition without pain in R knee.    Time  8    Period  Weeks    Status  New    Target Date  06/21/18      PT LONG TERM GOAL #2   Title  Patient able to ambulate community distances with 3/10 pain or less.    Time  8    Period  Weeks    Status  New      PT LONG TERM GOAL #3   Title  Patient able to walk down a ramp with minimal  pain in the R knee.    Time  8    Period  Weeks    Status  New      PT LONG TERM GOAL #4   Title  Patient to demo negative Ober test on R knee to prevent further injury to knee.    Time  8    Period  Weeks    Status  New            Plan - 04/28/18 0900    Clinical Impression Statement  Patient presented in clinic with reports of R knee discomfort as he just got off work and inclines. Patient able to slowly perform intermittant supine exercises with pain reported with SAQ and Nustep. Limited supine exercises completed due to pain and discomfort. Passive stretching completed today with good stretch noted with hip flexor stretch off table, ITB, Piriformis stretch. Normal modalities response noted following removal of the modalities.     Rehab Potential  Good    PT Frequency  2x / week    PT Duration  8 weeks    PT Treatment/Interventions  ADLs/Self Care Home Management;Cryotherapy;Electrical Stimulation;Iontophoresis 4mg /ml Dexamethasone;Moist Heat;Ultrasound;Stair training;Therapeutic exercise;Neuromuscular re-education;Patient/family education;Manual techniques;Dry needling;Taping;Vasopneumatic Device    PT Next Visit Plan  flexibility; Painfree quad strenghening level 1; joint traction;     PT Home Exercise Plan  stretches: itb, quads, HF    Consulted and Agree with Plan of Care  Patient       Patient will benefit from skilled therapeutic intervention in order to improve the following deficits and impairments:  Pain, Impaired flexibility, Difficulty walking, Decreased range of motion  Visit Diagnosis: Stiffness of right knee, not elsewhere classified  Chronic pain of right knee     Problem List Patient Active Problem List   Diagnosis Date Noted  . Coronary artery disease involving native coronary artery of native heart without angina pectoris  04/25/2018  . Dyslipidemia 04/25/2018  . Acute on chronic systolic CHF (congestive heart failure) ([place]) 01/07/2018  . Benign  neoplasm of ascending colon   . Benign neoplasm of transverse colon   . Benign neoplasm of descending colon   . Benign neoplasm of sigmoid colon   . Benign neoplasm of colon   . Vitamin D deficiency 11/02/2017  . Current smoker 09/28/2017  . Morbid obesity ([place]) 09/28/2017  . Peripheral edema 09/28/2017  . Hypertension 09/28/2017  . Heavy cigarette smoker (20-39 per day) 11/15/2015  . Hyperlipemia 12/28/2008  . GASTRIC ULCER, ACUTE, HEMORRHAGE 12/28/2008  . 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 Seizure disorder [facility]) 12/28/2008    Standley Brooking, PTA 04/28/2018, 9:42 AM  [facility] [address] [place], [place], [address] Phone: [phone]   Fax:  [phone]  Name: JEROMY BORCHERDING MRN: [id number] Date of Birth: 11-Oct-1961

## 2018-05-03 ENCOUNTER — Encounter: Payer: Self-pay | Admitting: Physical Therapy

## 2018-05-03 ENCOUNTER — Ambulatory Visit: Payer: 59 | Admitting: Physical Therapy

## 2018-05-03 DIAGNOSIS — M25561 Pain in right knee: Secondary | ICD-10-CM

## 2018-05-03 DIAGNOSIS — G8929 Other chronic pain: Secondary | ICD-10-CM

## 2018-05-03 DIAGNOSIS — M25661 Stiffness of right knee, not elsewhere classified: Secondary | ICD-10-CM | POA: Diagnosis not present

## 2018-05-03 NOTE — Therapy (Signed)
[facility] [place], [place], [address] Phone: [phone]   Fax:  [phone]  Physical Therapy Treatment  Patient Details  Name: Melvin Villarreal MRN: [id number] Date of Birth: 03-23-1961 Referring Provider: Claretta Fraise   Encounter Date: 05/03/2018  PT End of Session - 05/03/18 0836    Visit Number  3    Number of Visits  16    Date for PT Re-Evaluation  06/21/18    PT Start Time  0823    PT Stop Time  0915    PT Time Calculation (min)  52 min    Activity Tolerance  Patient tolerated treatment well    Behavior During Therapy  [facility] for tasks assessed/performed       Past Medical History:  Diagnosis Date  . Hypertension   . Nephrolithiasis   . NEPHROLITHIASIS, HX OF 12/28/2008   Qualifier: Diagnosis of  By: Marland Mcalpine    . Seizures ([place])     Past Surgical History:  Procedure Laterality Date  . COLONOSCOPY WITH PROPOFOL N/A 12/14/2017   Procedure: COLONOSCOPY WITH PROPOFOL;  Surgeon: Yetta Flock, MD;  [facility];  Service: Gastroenterology;  Laterality: N/A;  . HAND SURGERY Left    Collapsed vein  . RIGHT/LEFT HEART CATH AND CORONARY ANGIOGRAPHY N/A 01/12/2018   Procedure: RIGHT/LEFT HEART CATH AND CORONARY ANGIOGRAPHY;  Surgeon: Sherren Mocha, MD;  Location: [place] [facility];  Service: Cardiovascular;  Laterality: N/A;    There were no vitals filed for this visit.  Subjective Assessment - 05/03/18 0832    Subjective  Reports that he was given a cortisone shot yesterday and was given handout regarding rooster cone shots and that MD would order the shots.     Pertinent History  Heart problems, epilepsy    Diagnostic tests  none    Patient Stated Goals  get rid of pain    Currently in Pain?  No/denies No pain since injection yesterday per patient report         [facility] PT Assessment - 05/03/18 0001      Assessment   Medical Diagnosis  arthritis of R knee    Onset Date/Surgical  Date  09/25/17    Next MD Visit  TBD      Precautions   Precautions  None                   [facility] Adult PT Treatment/Exercise - 05/03/18 0001      Knee/Hip Exercises: Aerobic   Nustep  L2 x8 min      Knee/Hip Exercises: Supine   Quad Sets  Strengthening;Right;15 reps    Short Arc Quad Sets  AROM;Right;2 sets;10 reps;Other (comment) + clicking    Heel Slides  AROM;Right;10 reps +R knee grinding    Hip Adduction Isometric  Strengthening;Both;15 reps    Straight Leg Raises  AROM;Right;15 reps      Modalities   Modalities  Management consultant  IFC    Electrical Stimulation Parameters  80-150 hz x15 min    Electrical Stimulation Goals  Pain;Edema      Vasopneumatic   Number Minutes Vasopneumatic   15 minutes    Vasopnuematic Location   Knee    Vasopneumatic Pressure  Low    Vasopneumatic Temperature   34  PT Short Term Goals - 04/26/18 0953      PT SHORT TERM GOAL #1   Title  Ind and compliant with HEP for flexibility    Time  3    Period  Weeks    Status  New    Target Date  05/17/18        PT Long Term Goals - 04/26/18 0954      PT LONG TERM GOAL #1   Title  Patient able to perform sit to stand transition without pain in R knee.    Time  8    Period  Weeks    Status  New    Target Date  06/21/18      PT LONG TERM GOAL #2   Title  Patient able to ambulate community distances with 3/10 pain or less.    Time  8    Period  Weeks    Status  New      PT LONG TERM GOAL #3   Title  Patient able to walk down a ramp with minimal pain in the R knee.    Time  8    Period  Weeks    Status  New      PT LONG TERM GOAL #4   Title  Patient to demo negative Ober test on R knee to prevent further injury to knee.    Time  8    Period  Weeks    Status  New            Plan - 05/03/18 0905    Clinical  Impression Statement  Patient tolerated today's treatment fairly well as he had reduced pain since receiving cortisone injection yesterday. Still limited with Nustep secondary to discomfort. Positive reaction to clicking and grinding with many R knee strengthening exercises. Ultimate goal with therex session was pain free R quad strengthening. Patient reporting really feeling the muscle weakness hip adduction isometric along hip adductors. Patient observed as ambulating in B hip ER and had natural tendency to ER R hip even with VCs and tactile cues. Patient provided handout regarding home TENS unit. Normal modalities response noted following removal of the modalities.     Rehab Potential  Good    PT Frequency  2x / week    PT Duration  8 weeks    PT Treatment/Interventions  ADLs/Self Care Home Management;Cryotherapy;Electrical Stimulation;Iontophoresis 4mg /ml Dexamethasone;Moist Heat;Ultrasound;Stair training;Therapeutic exercise;Neuromuscular re-education;Patient/family education;Manual techniques;Dry needling;Taping;Vasopneumatic Device    PT Next Visit Plan  flexibility; Painfree quad strenghening level 1; joint traction;     PT Home Exercise Plan  stretches: itb, quads, HF    Consulted and Agree with Plan of Care  Patient       Patient will benefit from skilled therapeutic intervention in order to improve the following deficits and impairments:  Pain, Impaired flexibility, Difficulty walking, Decreased range of motion  Visit Diagnosis: Stiffness of right knee, not elsewhere classified  Chronic pain of right knee     Problem List Patient Active Problem List   Diagnosis Date Noted  . Coronary artery disease involving native coronary artery of native heart without angina pectoris 04/25/2018  . Dyslipidemia 04/25/2018  . Acute on chronic systolic CHF (congestive heart failure) ([place]) 01/07/2018  . Benign neoplasm of ascending colon   . Benign neoplasm of transverse colon   . Benign  neoplasm of descending colon   . Benign neoplasm of sigmoid colon   . Benign neoplasm of colon   . Vitamin  D deficiency 11/02/2017  . Current smoker 09/28/2017  . Morbid obesity ([place]) 09/28/2017  . Peripheral edema 09/28/2017  . Hypertension 09/28/2017  . Heavy cigarette smoker (20-39 per day) 11/15/2015  . Hyperlipemia 12/28/2008  . GASTRIC ULCER, ACUTE, HEMORRHAGE 12/28/2008  . 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 Seizure disorder [facility]) 12/28/2008    Standley Brooking, PTA 05/03/2018, 9:40 AM  [facility] [address] [place], [place], [address] Phone: [phone]   Fax:  [phone]  Name: Melvin Villarreal MRN: [id number] Date of Birth: 03-23-1961

## 2018-05-05 ENCOUNTER — Encounter: Payer: Self-pay | Admitting: Physical Therapy

## 2018-05-05 ENCOUNTER — Ambulatory Visit: Payer: 59 | Admitting: Physical Therapy

## 2018-05-05 DIAGNOSIS — M25661 Stiffness of right knee, not elsewhere classified: Secondary | ICD-10-CM | POA: Diagnosis not present

## 2018-05-05 DIAGNOSIS — M25561 Pain in right knee: Secondary | ICD-10-CM

## 2018-05-05 DIAGNOSIS — G8929 Other chronic pain: Secondary | ICD-10-CM

## 2018-05-05 NOTE — Therapy (Signed)
[facility] [place], [place], [address] Phone: [phone]   Fax:  [phone]  Physical Therapy Treatment  Patient Details  Name: Melvin Villarreal MRN: [id number] Date of Birth: 02-02-1961 Referring Provider: Claretta Fraise   Encounter Date: 05/05/2018  PT End of Session - 05/05/18 0837    Visit Number  4    Number of Visits  16    Date for PT Re-Evaluation  06/21/18    PT Start Time  0817    PT Stop Time  0910    PT Time Calculation (min)  53 min    Activity Tolerance  Patient tolerated treatment well    Behavior During Therapy  [facility] for tasks assessed/performed       Past Medical History:  Diagnosis Date  . Hypertension   . Nephrolithiasis   . NEPHROLITHIASIS, HX OF 12/28/2008   Qualifier: Diagnosis of  By: Marland Mcalpine    . Seizures ([place])     Past Surgical History:  Procedure Laterality Date  . COLONOSCOPY WITH PROPOFOL N/A 12/14/2017   Procedure: COLONOSCOPY WITH PROPOFOL;  Surgeon: Yetta Flock, MD;  Location: [facility];  Service: Gastroenterology;  Laterality: N/A;  . HAND SURGERY Left    Collapsed vein  . RIGHT/LEFT HEART CATH AND CORONARY ANGIOGRAPHY N/A 01/12/2018   Procedure: RIGHT/LEFT HEART CATH AND CORONARY ANGIOGRAPHY;  Surgeon: Sherren Mocha, MD;  Location: [facility];  Service: Cardiovascular;  Laterality: N/A;    There were no vitals filed for this visit.  Subjective Assessment - 05/05/18 0836    Subjective  Reports popping with Nustep. Reports that he did a lot of walking last night at work.    Pertinent History  Heart problems, epilepsy    Diagnostic tests  none    Patient Stated Goals  get rid of pain    Currently in Pain?  Yes    Pain Score  5     Pain Location  Knee    Pain Orientation  Right    Pain Descriptors / Indicators  Sore    Pain Type  Chronic pain    Pain Onset  More than a month ago         [facility] PT Assessment - 05/05/18 0001      Assessment    Medical Diagnosis  arthritis of R knee    Onset Date/Surgical Date  09/25/17    Next MD Visit  TBD      Precautions   Precautions  None                   [facility] Adult PT Treatment/Exercise - 05/05/18 0001      Knee/Hip Exercises: Aerobic   Nustep  L5 x15 min      Knee/Hip Exercises: Supine   Short Arc Quad Sets  AROM;Right;3 sets;10 reps    Straight Leg Raises  AROM;Right;15 reps    Straight Leg Raise with External Rotation  AROM;Right;10 reps + pain       Modalities   Modalities  Psychologist, educational Location  R knee    Electrical Stimulation Action  IFC    Electrical Stimulation Parameters  80-150 hz x15 min    Electrical Stimulation Goals  Pain      Vasopneumatic   Number Minutes Vasopneumatic   15 minutes    Vasopnuematic Location   Knee    Vasopneumatic Pressure  Low  Vasopneumatic Temperature   34               PT Short Term Goals - 04/26/18 0953      PT SHORT TERM GOAL #1   Title  Ind and compliant with HEP for flexibility    Time  3    Period  Weeks    Status  New    Target Date  05/17/18        PT Long Term Goals - 04/26/18 0954      PT LONG TERM GOAL #1   Title  Patient able to perform sit to stand transition without pain in R knee.    Time  8    Period  Weeks    Status  New    Target Date  06/21/18      PT LONG TERM GOAL #2   Title  Patient able to ambulate community distances with 3/10 pain or less.    Time  8    Period  Weeks    Status  New      PT LONG TERM GOAL #3   Title  Patient able to walk down a ramp with minimal pain in the R knee.    Time  8    Period  Weeks    Status  New      PT LONG TERM GOAL #4   Title  Patient to demo negative Ober test on R knee to prevent further injury to knee.    Time  8    Period  Weeks    Status  New            Plan - 05/05/18 0913    Clinical Impression Statement  Patient tolerated today's  treatment fairly well although he required frequent rest breaks with Nustep as well as exercises. Patient reported popping with Nustep today but discomfort throughout RLE with SLR and SLR with ER (pain primarily in R knee with SLR ER.) Patient very mobile with hip stretches that he demonstrated such as seated figure 4. Normal modalities response noted following removal of the modalities. Positive crepitus noted in R knee as well as audibly noted with popping.    Rehab Potential  Good    PT Frequency  2x / week    PT Duration  8 weeks    PT Treatment/Interventions  ADLs/Self Care Home Management;Cryotherapy;Electrical Stimulation;Iontophoresis 4mg /ml Dexamethasone;Moist Heat;Ultrasound;Stair training;Therapeutic exercise;Neuromuscular re-education;Patient/family education;Manual techniques;Dry needling;Taping;Vasopneumatic Device    PT Next Visit Plan  flexibility; Painfree quad strenghening level 1; joint traction;     PT Home Exercise Plan  stretches: itb, quads, HF    Consulted and Agree with Plan of Care  Patient       Patient will benefit from skilled therapeutic intervention in order to improve the following deficits and impairments:  Pain, Impaired flexibility, Difficulty walking, Decreased range of motion  Visit Diagnosis: Stiffness of right knee, not elsewhere classified  Chronic pain of right knee     Problem List Patient Active Problem List   Diagnosis Date Noted  . Coronary artery disease involving native coronary artery of native heart without angina pectoris 04/25/2018  . Dyslipidemia 04/25/2018  . Acute on chronic systolic CHF (congestive heart failure) ([place]) 01/07/2018  . Benign neoplasm of ascending colon   . Benign neoplasm of transverse colon   . Benign neoplasm of descending colon   . Benign neoplasm of sigmoid colon   . Benign neoplasm of colon   . Vitamin D deficiency  11/02/2017  . Current smoker 09/28/2017  . Morbid obesity ([place]) 09/28/2017  . Peripheral edema  09/28/2017  . Hypertension 09/28/2017  . Heavy cigarette smoker (20-39 per day) 11/15/2015  . Hyperlipemia 12/28/2008  . GASTRIC ULCER, ACUTE, HEMORRHAGE 12/28/2008  . 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 Seizure disorder [facility]) 12/28/2008    Standley Brooking, PTA 05/05/2018, 9:17 AM  [facility] [address] [place], [place], [address] Phone: [phone]   Fax:  [phone]  Name: ALEKSEI GOODLIN MRN: [id number] Date of Birth: 1961/08/13

## 2018-05-10 ENCOUNTER — Encounter: Payer: Self-pay | Admitting: Physical Therapy

## 2018-05-10 ENCOUNTER — Ambulatory Visit: Payer: 59 | Admitting: Physical Therapy

## 2018-05-10 DIAGNOSIS — M25561 Pain in right knee: Secondary | ICD-10-CM

## 2018-05-10 DIAGNOSIS — M25661 Stiffness of right knee, not elsewhere classified: Secondary | ICD-10-CM | POA: Diagnosis not present

## 2018-05-10 DIAGNOSIS — G8929 Other chronic pain: Secondary | ICD-10-CM

## 2018-05-10 NOTE — Therapy (Signed)
[facility] [place], [place], [address] Phone: [phone]   Fax:  [phone]  Physical Therapy Treatment  Patient Details  Name: Melvin Villarreal MRN: [id number] Date of Birth: 07-Jun-1961 Referring Provider: Claretta Fraise   Encounter Date: 05/10/2018  PT End of Session - 05/10/18 0839    Visit Number  5    Number of Visits  16    Date for PT Re-Evaluation  06/21/18    PT Start Time  0819    PT Stop Time  0900 3 units secondary to breaks during treatment    PT Time Calculation (min)  41 min    Activity Tolerance  Patient tolerated treatment well    Behavior During Therapy  [facility] for tasks assessed/performed       Past Medical History:  Diagnosis Date  . Hypertension   . Nephrolithiasis   . NEPHROLITHIASIS, HX OF 12/28/2008   Qualifier: Diagnosis of  By: Marland Mcalpine    . Seizures ([place])     Past Surgical History:  Procedure Laterality Date  . COLONOSCOPY WITH PROPOFOL N/A 12/14/2017   Procedure: COLONOSCOPY WITH PROPOFOL;  Surgeon: Yetta Flock, MD;  [facility]: [facility];  Service: Gastroenterology;  Laterality: N/A;  . HAND SURGERY Left    Collapsed vein  . RIGHT/LEFT HEART CATH AND CORONARY ANGIOGRAPHY N/A 01/12/2018   Procedure: RIGHT/LEFT HEART CATH AND CORONARY ANGIOGRAPHY;  Surgeon: Sherren Mocha, MD;  Location: [place] [facility];  Service: Cardiovascular;  Laterality: N/A;    There were no vitals filed for this visit.  Subjective Assessment - 05/10/18 0838    Subjective  Reports that he begins injections in his knees in September. Reports a long night at work last night.    Pertinent History  Heart problems, epilepsy    Diagnostic tests  none    Patient Stated Goals  get rid of pain    Currently in Pain?  Yes    Pain Score  4     Pain Location  Knee    Pain Orientation  Right    Pain Descriptors / Indicators  Discomfort    Pain Type  Chronic pain    Pain Onset  More than a month ago          [facility] PT Assessment - 05/10/18 0001      Assessment   Medical Diagnosis  arthritis of R knee    Onset Date/Surgical Date  09/25/17    Next MD Visit  TBD      Precautions   Precautions  None                   [facility] Adult PT Treatment/Exercise - 05/10/18 0001      Knee/Hip Exercises: Aerobic   Nustep  L4 x58min      Knee/Hip Exercises: Supine   Short Arc Quad Sets  Strengthening;Right;2 sets;10 reps;Limitations    Short Arc Quad Sets Limitations  3# ankleweight for 3-5 sec holds    Straight Leg Raises  Strengthening;Right;15 reps + cramping with VCs for neutral positioning      Modalities   Modalities  Psychologist, educational Location  R knee    Electrical Stimulation Action  IFC    Electrical Stimulation Parameters  80-150 hz x10 min    Electrical Stimulation Goals  Pain      Vasopneumatic   Number Minutes Vasopneumatic   10 minutes  Vasopnuematic Location   Knee    Vasopneumatic Pressure  Low    Vasopneumatic Temperature   34               PT Short Term Goals - 05/10/18 0908      PT SHORT TERM GOAL #1   Title  Ind and compliant with HEP for flexibility    Time  3    Period  Weeks    Status  On-going        PT Long Term Goals - 05/10/18 2725      PT LONG TERM GOAL #1   Title  Patient able to perform sit to stand transition without pain in R knee.    Time  8    Period  Weeks    Status  On-going      PT LONG TERM GOAL #2   Title  Patient able to ambulate community distances with 3/10 pain or less.    Time  8    Period  Weeks    Status  On-going      PT LONG TERM GOAL #3   Title  Patient able to walk down a ramp with minimal pain in the R knee.    Time  8    Period  Weeks    Status  On-going      PT LONG TERM GOAL #4   Title  Patient to demo negative Ober test on R knee to prevent further injury to knee.    Time  8    Period  Weeks    Status  On-going             Plan - 05/10/18 3664    Clinical Impression Statement  Patient somewhat limited during today's treatment due to RLE cramping during exercises. Patient requires short rest breaks on Nustep and required bathroom break during therex session as well. No extensor lag noted with exercises today but patient did require VCs to maintain neutral RLE for regular SLR. Cramping began with patient correcting technique. Patient subconsciously externally rotates RLE with exercises and at rest as well as ambulation. Limited modalities session due to discomfort reported by patient during the session with compression.     Rehab Potential  Good    PT Frequency  2x / week    PT Duration  8 weeks    PT Treatment/Interventions  ADLs/Self Care Home Management;Cryotherapy;Electrical Stimulation;Iontophoresis 4mg /ml Dexamethasone;Moist Heat;Ultrasound;Stair training;Therapeutic exercise;Neuromuscular re-education;Patient/family education;Manual techniques;Dry needling;Taping;Vasopneumatic Device    PT Next Visit Plan  flexibility; Painfree quad strenghening level 1; joint traction;     PT Home Exercise Plan  stretches: itb, quads, HF    Consulted and Agree with Plan of Care  Patient       Patient will benefit from skilled therapeutic intervention in order to improve the following deficits and impairments:  Pain, Impaired flexibility, Difficulty walking, Decreased range of motion  Visit Diagnosis: Stiffness of right knee, not elsewhere classified  Chronic pain of right knee     Problem List Patient Active Problem List   Diagnosis Date Noted  . Coronary artery disease involving native coronary artery of native heart without angina pectoris 04/25/2018  . Dyslipidemia 04/25/2018  . Acute on chronic systolic CHF (congestive heart failure) ([place]) 01/07/2018  . Benign neoplasm of ascending colon   . Benign neoplasm of transverse colon   . Benign neoplasm of descending colon   . Benign neoplasm of  sigmoid colon   . Benign neoplasm of colon   .  Vitamin D deficiency 11/02/2017  . Current smoker 09/28/2017  . Morbid obesity ([place]) 09/28/2017  . Peripheral edema 09/28/2017  . Hypertension 09/28/2017  . Heavy cigarette smoker (20-39 per day) 11/15/2015  . Hyperlipemia 12/28/2008  . GASTRIC ULCER, ACUTE, HEMORRHAGE 12/28/2008  . 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 Seizure disorder [facility]) 12/28/2008    Standley Brooking, PTA 05/10/2018, 9:08 AM  [facility] [address] [place], [place], [address] Phone: [phone]   Fax:  [phone]  Name: Melvin Villarreal MRN: [id number] Date of Birth: [birth year]

## 2018-05-12 ENCOUNTER — Ambulatory Visit: Payer: 59 | Attending: Family Medicine | Admitting: Physical Therapy

## 2018-05-12 ENCOUNTER — Encounter: Payer: Self-pay | Admitting: Physical Therapy

## 2018-05-12 DIAGNOSIS — M25661 Stiffness of right knee, not elsewhere classified: Secondary | ICD-10-CM | POA: Diagnosis present

## 2018-05-12 DIAGNOSIS — M25561 Pain in right knee: Secondary | ICD-10-CM | POA: Insufficient documentation

## 2018-05-12 DIAGNOSIS — G8929 Other chronic pain: Secondary | ICD-10-CM | POA: Insufficient documentation

## 2018-05-12 NOTE — Therapy (Signed)
[facility] [place], [place], [address] Phone: [phone]   Fax:  [phone]  Physical Therapy Treatment  Patient Details  Name: Melvin Villarreal MRN: [id number] Date of Birth: Apr 05, 1961 Referring Provider: Claretta Fraise   Encounter Date: 05/12/2018  PT End of Session - 05/12/18 0823    Visit Number  6    Number of Visits  16    Date for PT Re-Evaluation  06/21/18    PT Start Time  0817    PT Stop Time  0911    PT Time Calculation (min)  54 min    Activity Tolerance  Patient tolerated treatment well    Behavior During Therapy  [facility] for tasks assessed/performed       Past Medical History:  Diagnosis Date  . Hypertension   . Nephrolithiasis   . NEPHROLITHIASIS, HX OF 12/28/2008   Qualifier: Diagnosis of  By: Marland Mcalpine    . Seizures ([place])     Past Surgical History:  Procedure Laterality Date  . COLONOSCOPY WITH PROPOFOL N/A 12/14/2017   Procedure: COLONOSCOPY WITH PROPOFOL;  Surgeon: Yetta Flock, MD;  [facility]: [facility];  Service: Gastroenterology;  Laterality: N/A;  . HAND SURGERY Left    Collapsed vein  . RIGHT/LEFT HEART CATH AND CORONARY ANGIOGRAPHY N/A 01/12/2018   Procedure: RIGHT/LEFT HEART CATH AND CORONARY ANGIOGRAPHY;  Surgeon: Sherren Mocha, MD;  Location: [facility] [facility];  Service: Cardiovascular;  Laterality: N/A;    There were no vitals filed for this visit.  Subjective Assessment - 05/12/18 0818    Subjective  Reports knee stiffness and a rough night at work last night.    Pertinent History  Heart problems, epilepsy    Diagnostic tests  none    Patient Stated Goals  get rid of pain    Currently in Pain?  Yes    Pain Score  2     Pain Location  Knee    Pain Orientation  Right    Pain Descriptors / Indicators  Discomfort    Pain Type  Chronic pain    Pain Onset  More than a month ago         [facility] PT Assessment - 05/12/18 0001      Assessment   Medical Diagnosis   arthritis of R knee    Onset Date/Surgical Date  09/25/17    Next MD Visit  TBD      Precautions   Precautions  None                   [facility] Adult PT Treatment/Exercise - 05/12/18 0001      Knee/Hip Exercises: Aerobic   Nustep  L5 x15 min      Knee/Hip Exercises: Supine   Short Arc Quad Sets  AROM;Right;15 reps    Straight Leg Raises  AROM;Right;15 reps    Straight Leg Raise with External Rotation  AROM;Right;15 reps      Modalities   Modalities  Theme park manager Action  IFC    Electrical Stimulation Parameters  80-150 hz x15 min    Electrical Stimulation Goals  Pain      Vasopneumatic   Number Minutes Vasopneumatic   15 minutes    Vasopnuematic Location   Knee    Vasopneumatic Pressure  Low    Vasopneumatic Temperature   34  PT Short Term Goals - 05/10/18 0908      PT SHORT TERM GOAL #1   Title  Ind and compliant with HEP for flexibility    Time  3    Period  Weeks    Status  On-going        PT Long Term Goals - 05/10/18 8119      PT LONG TERM GOAL #1   Title  Patient able to perform sit to stand transition without pain in R knee.    Time  8    Period  Weeks    Status  On-going      PT LONG TERM GOAL #2   Title  Patient able to ambulate community distances with 3/10 pain or less.    Time  8    Period  Weeks    Status  On-going      PT LONG TERM GOAL #3   Title  Patient able to walk down a ramp with minimal pain in the R knee.    Time  8    Period  Weeks    Status  On-going      PT LONG TERM GOAL #4   Title  Patient to demo negative Ober test on R knee to prevent further injury to knee.    Time  8    Period  Weeks    Status  On-going            Plan - 05/12/18 0901    Clinical Impression Statement  Patient presented in clinic with reports of stiffness in R knee and long night at work.  Patient still requiring rest breaks on Nustep intermittantly. Patient reported feeling popping in R knee during Nustep. No extensor lag noted during today's treatment and no complaints of any R medial LE discomfort. Normal modalities response noted following removal of the modalities.    Rehab Potential  Good    PT Frequency  2x / week    PT Duration  8 weeks    PT Treatment/Interventions  ADLs/Self Care Home Management;Cryotherapy;Electrical Stimulation;Iontophoresis 4mg /ml Dexamethasone;Moist Heat;Ultrasound;Stair training;Therapeutic exercise;Neuromuscular re-education;Patient/family education;Manual techniques;Dry needling;Taping;Vasopneumatic Device    PT Next Visit Plan  flexibility; Painfree quad strenghening level 1; joint traction;     PT Home Exercise Plan  stretches: itb, quads, HF    Consulted and Agree with Plan of Care  Patient       Patient will benefit from skilled therapeutic intervention in order to improve the following deficits and impairments:  Pain, Impaired flexibility, Difficulty walking, Decreased range of motion  Visit Diagnosis: Stiffness of right knee, not elsewhere classified  Chronic pain of right knee     Problem List Patient Active Problem List   Diagnosis Date Noted  . Coronary artery disease involving native coronary artery of native heart without angina pectoris 04/25/2018  . Dyslipidemia 04/25/2018  . Acute on chronic systolic CHF (congestive heart failure) ([place]) 01/07/2018  . Benign neoplasm of ascending colon   . Benign neoplasm of transverse colon   . Benign neoplasm of descending colon   . Benign neoplasm of sigmoid colon   . Benign neoplasm of colon   . Vitamin D deficiency 11/02/2017  . Current smoker 09/28/2017  . Morbid obesity ([place]) 09/28/2017  . Peripheral edema 09/28/2017  . Hypertension 09/28/2017  . Heavy cigarette smoker (20-39 per day) 11/15/2015  . Hyperlipemia 12/28/2008  . GASTRIC ULCER, ACUTE, HEMORRHAGE 12/28/2008  .  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 Seizure disorder ([place]) 12/28/2008  Standley Brooking, PTA 05/12/2018, 9:23 AM  [facility] [address], [place], [address] Phone: [phone]   Fax:  [phone]  Name: Melvin Villarreal MRN: [id number] Date of Birth: 02/07/1961

## 2018-05-17 ENCOUNTER — Ambulatory Visit: Payer: 59 | Admitting: Physical Therapy

## 2018-05-17 ENCOUNTER — Encounter: Payer: Self-pay | Admitting: Physical Therapy

## 2018-05-17 DIAGNOSIS — M25661 Stiffness of right knee, not elsewhere classified: Secondary | ICD-10-CM | POA: Diagnosis not present

## 2018-05-17 DIAGNOSIS — M25561 Pain in right knee: Secondary | ICD-10-CM

## 2018-05-17 DIAGNOSIS — G8929 Other chronic pain: Secondary | ICD-10-CM

## 2018-05-17 NOTE — Therapy (Signed)
[facility] [place], [place], [address] Phone: [phone]   Fax:  [phone]  Physical Therapy Treatment  Patient Details  Name: Melvin Villarreal MRN: [id number] Date of Birth: 06/16/1961 Referring Provider: Claretta Fraise   Encounter Date: 05/17/2018  PT End of Session - 05/17/18 0828    Visit Number  7    Number of Visits  16    Date for PT Re-Evaluation  06/21/18    PT Start Time  0817    PT Stop Time  0901    PT Time Calculation (min)  44 min    Activity Tolerance  Patient tolerated treatment well    Behavior During Therapy  [facility] for tasks assessed/performed       Past Medical History:  Diagnosis Date  . Hypertension   . Nephrolithiasis   . NEPHROLITHIASIS, HX OF 12/28/2008   Qualifier: Diagnosis of  By: Marland Mcalpine    . Seizures ([place])     Past Surgical History:  Procedure Laterality Date  . COLONOSCOPY WITH PROPOFOL N/A 12/14/2017   Procedure: COLONOSCOPY WITH PROPOFOL;  Surgeon: Yetta Flock, MD;  [facility]: [facility];  Service: Gastroenterology;  Laterality: N/A;  . HAND SURGERY Left    Collapsed vein  . RIGHT/LEFT HEART CATH AND CORONARY ANGIOGRAPHY N/A 01/12/2018   Procedure: RIGHT/LEFT HEART CATH AND CORONARY ANGIOGRAPHY;  Surgeon: Sherren Mocha, MD;  Location: [place] [facility];  Service: Cardiovascular;  Laterality: N/A;    There were no vitals filed for this visit.  Subjective Assessment - 05/17/18 0825    Subjective  Reports that his knee is okay.    Pertinent History  Heart problems, epilepsy    Diagnostic tests  none    Patient Stated Goals  get rid of pain    Currently in Pain?  No/denies         [facility] PT Assessment - 05/17/18 0001      Assessment   Medical Diagnosis  arthritis of R knee    Onset Date/Surgical Date  09/25/17    Next MD Visit  TBD      Precautions   Precautions  None                   [facility] Adult PT Treatment/Exercise - 05/17/18 0001       Knee/Hip Exercises: Aerobic   Nustep  L5 x10 min      Knee/Hip Exercises: Standing   Terminal Knee Extension  Strengthening;Right;5 reps;Other (comment) Pink XTS    Forward Step Up  Right;2 sets;10 reps;Hand Hold: 1;Step Height: 6" began having pain in inferior R knee    Rocker Board  2 minutes      Knee/Hip Exercises: Supine   Straight Leg Raises  AROM;Right;15 reps    Straight Leg Raise with External Rotation  AROM;Right;15 reps      Modalities   Modalities  Management consultant  IFC    Electrical Stimulation Parameters  80-150 hz x15 min    Electrical Stimulation Goals  Pain      Vasopneumatic   Number Minutes Vasopneumatic   15 minutes    Vasopnuematic Location   Knee    Vasopneumatic Pressure  Low    Vasopneumatic Temperature   34               PT Short Term Goals -  05/10/18 0908      PT SHORT TERM GOAL #1   Title  Ind and compliant with HEP for flexibility    Time  3    Period  Weeks    Status  On-going        PT Long Term Goals - 05/10/18 5465      PT LONG TERM GOAL #1   Title  Patient able to perform sit to stand transition without pain in R knee.    Time  8    Period  Weeks    Status  On-going      PT LONG TERM GOAL #2   Title  Patient able to ambulate community distances with 3/10 pain or less.    Time  8    Period  Weeks    Status  On-going      PT LONG TERM GOAL #3   Title  Patient able to walk down a ramp with minimal pain in the R knee.    Time  8    Period  Weeks    Status  On-going      PT LONG TERM GOAL #4   Title  Patient to demo negative Ober test on R knee to prevent further injury to knee.    Time  8    Period  Weeks    Status  On-going            Plan - 05/17/18 0856    Clinical Impression Statement  Patient tolerated today's treatment well with no complaints of pain upon arrival. Patient  able to be guided through more progressed exercises such as forward step ups (inferior knee pain) and TKE but patient reported popping. Rockerboard completed with reports of R calf tightness. Good activation and control with SLR and SLR with ER. Normal modalities response noted following removal of the modalities.    Rehab Potential  Good    PT Frequency  2x / week    PT Duration  8 weeks    PT Treatment/Interventions  ADLs/Self Care Home Management;Cryotherapy;Electrical Stimulation;Iontophoresis 4mg /ml Dexamethasone;Moist Heat;Ultrasound;Stair training;Therapeutic exercise;Neuromuscular re-education;Patient/family education;Manual techniques;Dry needling;Taping;Vasopneumatic Device    PT Next Visit Plan  flexibility; Painfree quad strenghening level 1; joint traction;     PT Home Exercise Plan  stretches: itb, quads, HF    Consulted and Agree with Plan of Care  Patient       Patient will benefit from skilled therapeutic intervention in order to improve the following deficits and impairments:  Pain, Impaired flexibility, Difficulty walking, Decreased range of motion  Visit Diagnosis: Stiffness of right knee, not elsewhere classified  Chronic pain of right knee     Problem List Patient Active Problem List   Diagnosis Date Noted  . Coronary artery disease involving native coronary artery of native heart without angina pectoris 04/25/2018  . Dyslipidemia 04/25/2018  . Acute on chronic systolic CHF (congestive heart failure) ([place]) 01/07/2018  . Benign neoplasm of ascending colon   . Benign neoplasm of transverse colon   . Benign neoplasm of descending colon   . Benign neoplasm of sigmoid colon   . Benign neoplasm of colon   . Vitamin D deficiency 11/02/2017  . Current smoker 09/28/2017  . Morbid obesity ([place]) 09/28/2017  . Peripheral edema 09/28/2017  . Hypertension 09/28/2017  . Heavy cigarette smoker (20-39 per day) 11/15/2015  . Hyperlipemia 12/28/2008  . GASTRIC ULCER, ACUTE,  HEMORRHAGE 12/28/2008  . 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 Seizure disorder ([place]) 12/28/2008  Standley Brooking, PTA 05/17/2018, 9:22 AM  [facility] [address] [place], [place], [address] Phone: [phone]   Fax:  [phone]  Name: Melvin Villarreal MRN: [id number] Date of Birth: [birth year]

## 2018-05-19 ENCOUNTER — Encounter: Payer: Self-pay | Admitting: Physical Therapy

## 2018-05-19 ENCOUNTER — Ambulatory Visit: Payer: 59 | Admitting: Physical Therapy

## 2018-05-19 DIAGNOSIS — G8929 Other chronic pain: Secondary | ICD-10-CM

## 2018-05-19 DIAGNOSIS — M25661 Stiffness of right knee, not elsewhere classified: Secondary | ICD-10-CM

## 2018-05-19 DIAGNOSIS — M25561 Pain in right knee: Secondary | ICD-10-CM

## 2018-05-19 NOTE — Therapy (Signed)
[facility] [place], [place], [address] Phone: [phone]   Fax:  [phone]  Physical Therapy Treatment  Patient Details  Name: EXZAVIER RUDERMAN MRN: [id number] Date of Birth: Mar 11, 1961 Referring Provider: Claretta Fraise   Encounter Date: 05/19/2018  PT End of Session - 05/19/18 0836    Visit Number  8    Number of Visits  16    Date for PT Re-Evaluation  06/21/18    PT Start Time  0826    PT Stop Time  0902    PT Time Calculation (min)  36 min    Activity Tolerance  Patient tolerated treatment well    Behavior During Therapy  [facility] for tasks assessed/performed       Past Medical History:  Diagnosis Date  . Hypertension   . Nephrolithiasis   . NEPHROLITHIASIS, HX OF 12/28/2008   Qualifier: Diagnosis of  By: Marland Mcalpine    . Seizures ([place])     Past Surgical History:  Procedure Laterality Date  . COLONOSCOPY WITH PROPOFOL N/A 12/14/2017   Procedure: COLONOSCOPY WITH PROPOFOL;  Surgeon: Yetta Flock, MD;  [facility];  Service: Gastroenterology;  Laterality: N/A;  . HAND SURGERY Left    Collapsed vein  . RIGHT/LEFT HEART CATH AND CORONARY ANGIOGRAPHY N/A 01/12/2018   Procedure: RIGHT/LEFT HEART CATH AND CORONARY ANGIOGRAPHY;  Surgeon: Sherren Mocha, MD;  Location: [place] [facility];  Service: Cardiovascular;  Laterality: N/A;    There were no vitals filed for this visit.  Subjective Assessment - 05/19/18 0826    Subjective  Reports his R knee is very sore this morning and had to do an extensive amount of walkingand kneeling last night at work.    Pertinent History  Heart problems, epilepsy    Diagnostic tests  none    Patient Stated Goals  get rid of pain    Currently in Pain?  Yes    Pain Score  6     Pain Location  Knee    Pain Orientation  Right    Pain Descriptors / Indicators  Sore    Pain Type  Chronic pain    Pain Onset  More than a month ago         [facility] PT Assessment  - 05/19/18 0001      Assessment   Medical Diagnosis  arthritis of R knee    Onset Date/Surgical Date  09/25/17    Next MD Visit  TBD      Precautions   Precautions  None                   [facility] Adult PT Treatment/Exercise - 05/19/18 0001      Exercises   Exercises  Knee/Hip      Knee/Hip Exercises: Aerobic   Nustep  L6 x10 min      Knee/Hip Exercises: Standing   Hip Abduction  AROM;Right;20 reps;Knee straight      Knee/Hip Exercises: Supine   Straight Leg Raises  AROM;Right;20 reps    Straight Leg Raise with External Rotation  AROM;Right;20 reps      Modalities   Modalities  Theme park manager Action  IFC    Electrical Stimulation Parameters  80-150 hz x15 min    Electrical Stimulation Goals  Pain      Vasopneumatic   Number Minutes Vasopneumatic  15 minutes    Vasopnuematic Location   Knee    Vasopneumatic Pressure  Low    Vasopneumatic Temperature   34                         Plan - 05/19/18 0929    Clinical Impression Statement  Patient tolerated today's treatment fairly well although he arrived with increased soreness in R knee from kneeling and prolonged walking. Patient limited with therex session due to late arrival and bathroom break. Patient experiencing more cramping overall per patient report. Pitting edema noted in BLEs. Normal modalities response noted following removal of the modalities.    Rehab Potential  Good    PT Frequency  2x / week    PT Duration  8 weeks    PT Treatment/Interventions  ADLs/Self Care Home Management;Cryotherapy;Electrical Stimulation;Iontophoresis 4mg /ml Dexamethasone;Moist Heat;Ultrasound;Stair training;Therapeutic exercise;Neuromuscular re-education;Patient/family education;Manual techniques;Dry needling;Taping;Vasopneumatic Device    PT Next Visit Plan  flexibility; Painfree quad  strenghening level 1; joint traction;     PT Home Exercise Plan  stretches: itb, quads, HF    Consulted and Agree with Plan of Care  Patient       Patient will benefit from skilled therapeutic intervention in order to improve the following deficits and impairments:  Pain, Impaired flexibility, Difficulty walking, Decreased range of motion  Visit Diagnosis: Stiffness of right knee, not elsewhere classified  Chronic pain of right knee     Problem List Patient Active Problem List   Diagnosis Date Noted  . Coronary artery disease involving native coronary artery of native heart without angina pectoris 04/25/2018  . Dyslipidemia 04/25/2018  . Acute on chronic systolic CHF (congestive heart failure) ([place]) 01/07/2018  . Benign neoplasm of ascending colon   . Benign neoplasm of transverse colon   . Benign neoplasm of descending colon   . Benign neoplasm of sigmoid colon   . Benign neoplasm of colon   . Vitamin D deficiency 11/02/2017  . Current smoker 09/28/2017  . Morbid obesity ([place]) 09/28/2017  . Peripheral edema 09/28/2017  . Hypertension 09/28/2017  . Heavy cigarette smoker (20-39 per day) 11/15/2015  . Hyperlipemia 12/28/2008  . GASTRIC ULCER, ACUTE, HEMORRHAGE 12/28/2008  . 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 Seizure disorder [facility]) 12/28/2008    Standley Brooking, PTA 05/19/2018, 9:37 AM  [facility] [address] [place], [place], [address] Phone: [phone]   Fax:  [phone]  Name: KORT STETTLER MRN: [id number] Date of Birth: 21-Apr-1961

## 2018-05-24 ENCOUNTER — Ambulatory Visit: Payer: 59 | Admitting: Physical Therapy

## 2018-05-24 ENCOUNTER — Encounter: Payer: Self-pay | Admitting: Physical Therapy

## 2018-05-24 DIAGNOSIS — M25661 Stiffness of right knee, not elsewhere classified: Secondary | ICD-10-CM

## 2018-05-24 DIAGNOSIS — M25561 Pain in right knee: Secondary | ICD-10-CM

## 2018-05-24 DIAGNOSIS — G8929 Other chronic pain: Secondary | ICD-10-CM

## 2018-05-24 NOTE — Therapy (Signed)
[facility] [place], [place], [address] Phone: [phone]   Fax:  [phone]  Physical Therapy Treatment  Patient Details  Name: ALHASSAN EVERINGHAM MRN: [id number] Date of Birth: 03-03-1961 Referring Provider: Claretta Fraise   Encounter Date: 05/24/2018  PT End of Session - 05/24/18 0920    Visit Number  9    Number of Visits  16    Date for PT Re-Evaluation  06/21/18    PT Start Time  0900    PT Stop Time  0950    PT Time Calculation (min)  50 min    Activity Tolerance  Patient tolerated treatment well    Behavior During Therapy  [facility] for tasks assessed/performed       Past Medical History:  Diagnosis Date  . Hypertension   . Nephrolithiasis   . NEPHROLITHIASIS, HX OF 12/28/2008   Qualifier: Diagnosis of  By: Marland Mcalpine    . Seizures ([place])     Past Surgical History:  Procedure Laterality Date  . COLONOSCOPY WITH PROPOFOL N/A 12/14/2017   Procedure: COLONOSCOPY WITH PROPOFOL;  Surgeon: Yetta Flock, MD;  [facility];  Service: Gastroenterology;  Laterality: N/A;  . HAND SURGERY Left    Collapsed vein  . RIGHT/LEFT HEART CATH AND CORONARY ANGIOGRAPHY N/A 01/12/2018   Procedure: RIGHT/LEFT HEART CATH AND CORONARY ANGIOGRAPHY;  Surgeon: Sherren Mocha, MD;  Location: [place] [facility];  Service: Cardiovascular;  Laterality: N/A;    There were no vitals filed for this visit.  Subjective Assessment - 05/24/18 0918    Subjective  Pt reporting stiffness while at work.     Pertinent History  Heart problems, epilepsy    Diagnostic tests  none    Patient Stated Goals  get rid of pain    Currently in Pain?  No/denies    Pain Orientation  Right                       [facility] Adult PT Treatment/Exercise - 05/24/18 0001      Exercises   Exercises  Knee/Hip      Knee/Hip Exercises: Aerobic   Nustep  L6 x 10 minutes      Knee/Hip Exercises: Standing   Hip Abduction   AROM;Right;20 reps;Knee straight      Knee/Hip Exercises: Supine   Straight Leg Raises  AROM;Right;20 reps    Straight Leg Raise with External Rotation  AROM;Right;20 reps      Modalities   Modalities  Psychologist, educational Location  R knee    Electrical Stimulation Action  IFC    Electrical Stimulation Parameters  80-150 Hz x 15 minutes,     Electrical Stimulation Goals  Pain      Vasopneumatic   Number Minutes Vasopneumatic   15 minutes    Vasopnuematic Location   Knee    Vasopneumatic Pressure  Low    Vasopneumatic Temperature   34             PT Education - 05/24/18 0920    Education Details  Reviwed HEP    Person(s) Educated  Patient    Methods  Explanation    Comprehension  Verbalized understanding       PT Short Term Goals - 05/10/18 0908      PT SHORT TERM GOAL #1   Title  Ind and compliant with HEP for flexibility  Time  3    Period  Weeks    Status  On-going        PT Long Term Goals - 05/24/18 0934      PT LONG TERM GOAL #1   Title  Patient able to perform sit to stand transition without pain in R knee.    Baseline  currently intermittent pain    Time  8    Period  Weeks    Status  On-going      PT LONG TERM GOAL #2   Title  Patient able to ambulate community distances with 3/10 pain or less.    Time  8    Period  Weeks    Status  On-going      PT LONG TERM GOAL #3   Title  Patient able to walk down a ramp with minimal pain in the R knee.    Time  8    Status  On-going      PT LONG TERM GOAL #4   Title  Patient to demo negative Ober test on R knee to prevent further injury to knee.    Time  8    Period  Weeks            Plan - 05/24/18 0932    Clinical Impression Statement  Pt tolerating therapy well today. Pt reporting no pain upon arrival. Pt only reporting stiffness while at work. Pt reporting being on his feet for long periods at work and having to bend  and lift. Continue to progress with skilled PT.     Rehab Potential  Good    PT Frequency  2x / week    PT Duration  8 weeks    PT Treatment/Interventions  ADLs/Self Care Home Management;Cryotherapy;Electrical Stimulation;Iontophoresis 4mg /ml Dexamethasone;Moist Heat;Ultrasound;Stair training;Therapeutic exercise;Neuromuscular re-education;Patient/family education;Manual techniques;Dry needling;Taping;Vasopneumatic Device    PT Next Visit Plan  flexibility; Painfree quad strenghening level 1; joint traction;     PT Home Exercise Plan  stretches: itb, quads, HF    Consulted and Agree with Plan of Care  Patient       Patient will benefit from skilled therapeutic intervention in order to improve the following deficits and impairments:  Pain, Impaired flexibility, Difficulty walking, Decreased range of motion  Visit Diagnosis: Stiffness of right knee, not elsewhere classified  Chronic pain of right knee     Problem List Patient Active Problem List   Diagnosis Date Noted  . Coronary artery disease involving native coronary artery of native heart without angina pectoris 04/25/2018  . Dyslipidemia 04/25/2018  . Acute on chronic systolic CHF (congestive heart failure) ([place]) 01/07/2018  . Benign neoplasm of ascending colon   . Benign neoplasm of transverse colon   . Benign neoplasm of descending colon   . Benign neoplasm of sigmoid colon   . Benign neoplasm of colon   . Vitamin D deficiency 11/02/2017  . Current smoker 09/28/2017  . Morbid obesity ([place]) 09/28/2017  . Peripheral edema 09/28/2017  . Hypertension 09/28/2017  . Heavy cigarette smoker (20-39 per day) 11/15/2015  . Hyperlipemia 12/28/2008  . GASTRIC ULCER, ACUTE, HEMORRHAGE 12/28/2008  . 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 Seizure disorder [facility]) 12/28/2008    Oretha Caprice , MPT 05/24/2018, 10:02 AM  [facility] [address] [place], [place], [address] Phone: [phone]   Fax:   [phone]  Name: NEMESIO CASTRILLON MRN: [id number] Date of Birth: 1961/02/22

## 2018-05-27 ENCOUNTER — Ambulatory Visit: Payer: 59 | Admitting: *Deleted

## 2018-05-27 DIAGNOSIS — G8929 Other chronic pain: Secondary | ICD-10-CM

## 2018-05-27 DIAGNOSIS — M25661 Stiffness of right knee, not elsewhere classified: Secondary | ICD-10-CM | POA: Diagnosis not present

## 2018-05-27 DIAGNOSIS — M25561 Pain in right knee: Secondary | ICD-10-CM

## 2018-05-27 NOTE — Therapy (Signed)
[name] [place], [place], [address] Phone: [phone]   Fax:  [phone]  Physical Therapy Treatment  Patient Details  Name: Melvin Villarreal MRN: [id number] Date of Birth: 18-Dec-1960 Referring Provider: Claretta Fraise   Encounter Date: 05/27/2018  PT End of Session - 05/27/18 0854    Visit Number  10    Number of Visits  16    Date for PT Re-Evaluation  06/21/18    PT Start Time  0815    PT Stop Time  0907    PT Time Calculation (min)  52 min       Past Medical History:  Diagnosis Date  . Hypertension   . Nephrolithiasis   . NEPHROLITHIASIS, HX OF 12/28/2008   Qualifier: Diagnosis of  By: Marland Mcalpine    . Seizures ([place])     Past Surgical History:  Procedure Laterality Date  . COLONOSCOPY WITH PROPOFOL N/A 12/14/2017   Procedure: COLONOSCOPY WITH PROPOFOL;  Surgeon: Yetta Flock, MD;  [facility];  Service: Gastroenterology;  Laterality: N/A;  . HAND SURGERY Left    Collapsed vein  . RIGHT/LEFT HEART CATH AND CORONARY ANGIOGRAPHY N/A 01/12/2018   Procedure: RIGHT/LEFT HEART CATH AND CORONARY ANGIOGRAPHY;  Surgeon: Sherren Mocha, MD;  Location: [place] [facility];  Service: Cardiovascular;  Laterality: N/A;    There were no vitals filed for this visit.  Subjective Assessment - 05/27/18 0821    Subjective  Pt reporting stiffness while at work. RT knee stiff/tight    Pertinent History  Heart problems, epilepsy    Diagnostic tests  none    Patient Stated Goals  get rid of pain    Currently in Pain?  Yes    Pain Score  3     Pain Location  Knee    Pain Orientation  Right    Pain Descriptors / Indicators  Sore    Pain Type  Chronic pain    Pain Onset  More than a month ago    Pain Frequency  Intermittent                       [facility] Adult PT Treatment/Exercise - 05/27/18 0001      Exercises   Exercises  Knee/Hip      Knee/Hip Exercises: Aerobic   Nustep  L6 x  10 minutes      Knee/Hip Exercises: Standing   Hip Abduction  AROM;Right;20 reps;Knee straight    Rocker Board  3 minutes      Knee/Hip Exercises: Seated   Long Arc Quad  Right;3 sets;10 reps;Weights    Long Arc Quad Weight  5 lbs.      Knee/Hip Exercises: Supine   Straight Leg Raises  AROM;Right;20 reps    Straight Leg Raise with External Rotation  AROM;Right;20 reps      Modalities   Modalities  Psychologist, educational Location  R knee IFC x 15 mins 80-150hz  pain    Electrical Stimulation Goals  Pain      Vasopneumatic   Number Minutes Vasopneumatic   15 minutes    Vasopnuematic Location   Knee    Vasopneumatic Pressure  Low    Vasopneumatic Temperature   34               PT Short Term Goals - 05/10/18 0908      PT SHORT TERM GOAL #  1   Title  Ind and compliant with HEP for flexibility    Time  3    Period  Weeks    Status  On-going        PT Long Term Goals - 05/24/18 0934      PT LONG TERM GOAL #1   Title  Patient able to perform sit to stand transition without pain in R knee.    Baseline  currently intermittent pain    Time  8    Period  Weeks    Status  On-going      PT LONG TERM GOAL #2   Title  Patient able to ambulate community distances with 3/10 pain or less.    Time  8    Period  Weeks    Status  On-going      PT LONG TERM GOAL #3   Title  Patient able to walk down a ramp with minimal pain in the R knee.    Time  8    Status  On-going      PT LONG TERM GOAL #4   Title  Patient to demo negative Ober test on R knee to prevent further injury to knee.    Time  8    Period  Weeks            Plan - 05/27/18 0910    Clinical Impression Statement  Pt arrived today doing fairly well with RT knee. He continues to have crepitation with flexion/ extension. He was able to perform LAQs today with 5# wt without complaints of increased pain. Normal modality response today. Pt  reports that he will have injections in RT knee starting in September.    Clinical Presentation  Stable    Rehab Potential  Good    PT Frequency  2x / week    PT Duration  8 weeks    PT Treatment/Interventions  ADLs/Self Care Home Management;Cryotherapy;Electrical Stimulation;Iontophoresis 4mg /ml Dexamethasone;Moist Heat;Ultrasound;Stair training;Therapeutic exercise;Neuromuscular re-education;Patient/family education;Manual techniques;Dry needling;Taping;Vasopneumatic Device    PT Next Visit Plan  flexibility; Painfree quad strenghening level 1; joint traction;     PT Home Exercise Plan  stretches: itb, quads, HF    Consulted and Agree with Plan of Care  Patient       Patient will benefit from skilled therapeutic intervention in order to improve the following deficits and impairments:  Pain, Impaired flexibility, Difficulty walking, Decreased range of motion  Visit Diagnosis: Stiffness of right knee, not elsewhere classified  Chronic pain of right knee     Problem List Patient Active Problem List   Diagnosis Date Noted  . Coronary artery disease involving native coronary artery of native heart without angina pectoris 04/25/2018  . Dyslipidemia 04/25/2018  . Acute on chronic systolic CHF (congestive heart failure) ([place]) 01/07/2018  . Benign neoplasm of ascending colon   . Benign neoplasm of transverse colon   . Benign neoplasm of descending colon   . Benign neoplasm of sigmoid colon   . Benign neoplasm of colon   . Vitamin D deficiency 11/02/2017  . Current smoker 09/28/2017  . Morbid obesity ([place]) 09/28/2017  . Peripheral edema 09/28/2017  . Hypertension 09/28/2017  . Heavy cigarette smoker (20-39 per day) 11/15/2015  . Hyperlipemia 12/28/2008  . GASTRIC ULCER, ACUTE, HEMORRHAGE 12/28/2008  . 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 Seizure disorder ([place]) 12/28/2008    [name],[name], PTA 05/27/2018, 9:15 AM  [facility] [address] [place], [place], [address] Phone: [phone]  Fax:  [phone]  Name: Melvin Villarreal MRN: [id number] Date of Birth: Feb 15, 1961

## 2018-05-31 ENCOUNTER — Encounter: Payer: Self-pay | Admitting: Physical Therapy

## 2018-05-31 ENCOUNTER — Ambulatory Visit: Payer: 59 | Admitting: Physical Therapy

## 2018-05-31 DIAGNOSIS — G8929 Other chronic pain: Secondary | ICD-10-CM

## 2018-05-31 DIAGNOSIS — M25661 Stiffness of right knee, not elsewhere classified: Secondary | ICD-10-CM | POA: Diagnosis not present

## 2018-05-31 DIAGNOSIS — M25561 Pain in right knee: Secondary | ICD-10-CM

## 2018-05-31 NOTE — Therapy (Signed)
[facility] [place], [place], [address] Phone: [phone]   Fax:  [phone]  Physical Therapy Treatment  Patient Details  Name: Melvin Villarreal MRN: [id number] Date of Birth: 1961-05-16 Referring Provider: Claretta Fraise   Encounter Date: 05/31/2018  PT End of Session - 05/31/18 0824    Visit Number  11    Number of Visits  16    Date for PT Re-Evaluation  06/21/18    PT Start Time  0817    PT Stop Time  0858    PT Time Calculation (min)  41 min    Activity Tolerance  Patient tolerated treatment well    Behavior During Therapy  [facility] for tasks assessed/performed       Past Medical History:  Diagnosis Date  . Hypertension   . Nephrolithiasis   . NEPHROLITHIASIS, HX OF 12/28/2008   Qualifier: Diagnosis of  By: Marland Mcalpine    . Seizures ([place])     Past Surgical History:  Procedure Laterality Date  . COLONOSCOPY WITH PROPOFOL N/A 12/14/2017   Procedure: COLONOSCOPY WITH PROPOFOL;  Surgeon: Yetta Flock, MD;  Location: [facility];  Service: Gastroenterology;  Laterality: N/A;  . HAND SURGERY Left    Collapsed vein  . RIGHT/LEFT HEART CATH AND CORONARY ANGIOGRAPHY N/A 01/12/2018   Procedure: RIGHT/LEFT HEART CATH AND CORONARY ANGIOGRAPHY;  Surgeon: Sherren Mocha, MD;  Location: [place] [facility];  Service: Cardiovascular;  Laterality: N/A;    There were no vitals filed for this visit.  Subjective Assessment - 05/31/18 0818    Subjective  Reports more fatigue from work. Reports that his knee doesn't hurt as much as pop.    Pertinent History  Heart problems, epilepsy    Diagnostic tests  none    Patient Stated Goals  get rid of pain    Currently in Pain?  No/denies         [facility] PT Assessment - 05/31/18 0001      Assessment   Medical Diagnosis  arthritis of R knee    Onset Date/Surgical Date  09/25/17    Next MD Visit  TBD      Precautions   Precautions  None                    [facility] Adult PT Treatment/Exercise - 05/31/18 0001      Exercises   Exercises  Knee/Hip      Knee/Hip Exercises: Aerobic   Nustep  L7 x10 min      Knee/Hip Exercises: Standing   Hip Abduction  AROM;Right;20 reps;Knee straight    Forward Step Up  Right;2 sets;10 reps;Hand Hold: 2;Step Height: 6"      Knee/Hip Exercises: Seated   Long Arc Quad  Right;3 sets;10 reps;Weights    Long Arc Quad Weight  4 lbs.      Knee/Hip Exercises: Supine   Straight Leg Raises  AROM;Right;20 reps    Straight Leg Raise with External Rotation  AROM;Right;20 reps      Modalities   Modalities  Vasopneumatic      Vasopneumatic   Number Minutes Vasopneumatic   15 minutes    Vasopnuematic Location   Knee    Vasopneumatic Pressure  Low    Vasopneumatic Temperature   63               PT Short Term Goals - 05/10/18 0908      PT SHORT TERM GOAL #1  Title  Ind and compliant with HEP for flexibility    Time  3    Period  Weeks    Status  On-going        PT Long Term Goals - 05/24/18 0934      PT LONG TERM GOAL #1   Title  Patient able to perform sit to stand transition without pain in R knee.    Baseline  currently intermittent pain    Time  8    Period  Weeks    Status  On-going      PT LONG TERM GOAL #2   Title  Patient able to ambulate community distances with 3/10 pain or less.    Time  8    Period  Weeks    Status  On-going      PT LONG TERM GOAL #3   Title  Patient able to walk down a ramp with minimal pain in the R knee.    Time  8    Status  On-going      PT LONG TERM GOAL #4   Title  Patient to demo negative Ober test on R knee to prevent further injury to knee.    Time  8    Period  Weeks            Plan - 05/31/18 5277    Clinical Impression Statement  Patient denied any pain upon arrival just overall fatigue after work. No complaints of any increased knee pain with therex. Minimal R extensor lag noted with SLR with ER today.  Normal vasopneumatic response noted following removal of the modality.     Rehab Potential  Good    PT Frequency  2x / week    PT Duration  8 weeks    PT Treatment/Interventions  ADLs/Self Care Home Management;Cryotherapy;Electrical Stimulation;Iontophoresis 4mg /ml Dexamethasone;Moist Heat;Ultrasound;Stair training;Therapeutic exercise;Neuromuscular re-education;Patient/family education;Manual techniques;Dry needling;Taping;Vasopneumatic Device    PT Next Visit Plan  flexibility; Painfree quad strenghening level 1; joint traction;     PT Home Exercise Plan  stretches: itb, quads, HF    Consulted and Agree with Plan of Care  Patient       Patient will benefit from skilled therapeutic intervention in order to improve the following deficits and impairments:  Pain, Impaired flexibility, Difficulty walking, Decreased range of motion  Visit Diagnosis: Stiffness of right knee, not elsewhere classified  Chronic pain of right knee     Problem List Patient Active Problem List   Diagnosis Date Noted  . Coronary artery disease involving native coronary artery of native heart without angina pectoris 04/25/2018  . Dyslipidemia 04/25/2018  . Acute on chronic systolic CHF (congestive heart failure) ([place]) 01/07/2018  . Benign neoplasm of ascending colon   . Benign neoplasm of transverse colon   . Benign neoplasm of descending colon   . Benign neoplasm of sigmoid colon   . Benign neoplasm of colon   . Vitamin D deficiency 11/02/2017  . Current smoker 09/28/2017  . Morbid obesity ([place]) 09/28/2017  . Peripheral edema 09/28/2017  . Hypertension 09/28/2017  . Heavy cigarette smoker (20-39 per day) 11/15/2015  . Hyperlipemia 12/28/2008  . GASTRIC ULCER, ACUTE, HEMORRHAGE 12/28/2008  . 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 Seizure disorder [facility]) 12/28/2008    Standley Brooking, PTA 05/31/2018, 9:00 AM  [facility] [address] [place], [place], [address] Phone:  [phone]   Fax:  [phone]  Name: Melvin Villarreal MRN: [id number] Date of Birth: 1961/01/04

## 2018-06-02 ENCOUNTER — Ambulatory Visit: Payer: 59 | Admitting: Physical Therapy

## 2018-06-02 ENCOUNTER — Encounter: Payer: Self-pay | Admitting: Physical Therapy

## 2018-06-02 DIAGNOSIS — G8929 Other chronic pain: Secondary | ICD-10-CM

## 2018-06-02 DIAGNOSIS — M25561 Pain in right knee: Secondary | ICD-10-CM

## 2018-06-02 DIAGNOSIS — M25661 Stiffness of right knee, not elsewhere classified: Secondary | ICD-10-CM | POA: Diagnosis not present

## 2018-06-02 NOTE — Therapy (Signed)
[facility] [place], [place], [address] Phone: [phone]   Fax:  [phone]  Physical Therapy Treatment  Patient Details  Name: NIKOLAI WILCZAK MRN: [id number] Date of Birth: [birth year] Referring Provider: Claretta Fraise   Encounter Date: 06/02/2018  PT End of Session - 06/02/18 0824    Visit Number  12    Number of Visits  16    Date for PT Re-Evaluation  06/21/18    PT Start Time  0815    PT Stop Time  0859    PT Time Calculation (min)  44 min    Activity Tolerance  Patient tolerated treatment well    Behavior During Therapy  [facility] for tasks assessed/performed       Past Medical History:  Diagnosis Date  . Hypertension   . Nephrolithiasis   . NEPHROLITHIASIS, HX OF 12/28/2008   Qualifier: Diagnosis of  By: Marland Mcalpine    . Seizures ([place])     Past Surgical History:  Procedure Laterality Date  . COLONOSCOPY WITH PROPOFOL N/A 12/14/2017   Procedure: COLONOSCOPY WITH PROPOFOL;  Surgeon: Yetta Flock, MD;  [facility];  Service: Gastroenterology;  Laterality: N/A;  . HAND SURGERY Left    Collapsed vein  . RIGHT/LEFT HEART CATH AND CORONARY ANGIOGRAPHY N/A 01/12/2018   Procedure: RIGHT/LEFT HEART CATH AND CORONARY ANGIOGRAPHY;  Surgeon: Sherren Mocha, MD;  Location: [place] [facility];  Service: Cardiovascular;  Laterality: N/A;    There were no vitals filed for this visit.  Subjective Assessment - 06/02/18 0817    Subjective  Reports that he did a lot more walking and crawling at work last night. Reports that he does wear knee pads if he has to kneel or crawl.    Pertinent History  Heart problems, epilepsy    Diagnostic tests  none    Patient Stated Goals  get rid of pain    Currently in Pain?  Yes    Pain Score  --   No pain score provided   Pain Location  Knee    Pain Orientation  Right    Pain Descriptors / Indicators  Discomfort    Pain Type  Chronic pain    Pain Onset  More  than a month ago         [facility] PT Assessment - 06/02/18 0001      Assessment   Medical Diagnosis  arthritis of R knee    Onset Date/Surgical Date  09/25/17    Next MD Visit  TBD      Precautions   Precautions  None                   [facility] Adult PT Treatment/Exercise - 06/02/18 0001      Exercises   Exercises  Knee/Hip      Knee/Hip Exercises: Aerobic   Nustep  L7 x10 min      Knee/Hip Exercises: Standing   Hip Abduction  AROM;Right;20 reps;Knee straight    Forward Step Up  Right;2 sets;10 reps;Hand Hold: 2;Step Height: 6"      Knee/Hip Exercises: Seated   Long Arc Quad  Right;3 sets;10 reps;Weights    Long Arc Quad Weight  5 lbs.   7.5     Knee/Hip Exercises: Supine   Straight Leg Raises  AROM;Right;20 reps    Straight Leg Raise with External Rotation  AROM;Right;20 reps      Modalities   Modalities  Electrical Stimulation;Vasopneumatic  Ambulance person  IFC    Electrical Stimulation Parameters  80-150 hz x15 min    Electrical Stimulation Goals  Pain;Edema      Vasopneumatic   Number Minutes Vasopneumatic   15 minutes    Vasopnuematic Location   Knee    Vasopneumatic Pressure  Low    Vasopneumatic Temperature   34               PT Short Term Goals - 05/10/18 0908      PT SHORT TERM GOAL #1   Title  Ind and compliant with HEP for flexibility    Time  3    Period  Weeks    Status  On-going        PT Long Term Goals - 05/24/18 0934      PT LONG TERM GOAL #1   Title  Patient able to perform sit to stand transition without pain in R knee.    Baseline  currently intermittent pain    Time  8    Period  Weeks    Status  On-going      PT LONG TERM GOAL #2   Title  Patient able to ambulate community distances with 3/10 pain or less.    Time  8    Period  Weeks    Status  On-going      PT LONG TERM GOAL #3   Title  Patient able to walk down a  ramp with minimal pain in the R knee.    Time  8    Status  On-going      PT LONG TERM GOAL #4   Title  Patient to demo negative Ober test on R knee to prevent further injury to knee.    Time  8    Period  Weeks            Plan - 06/02/18 0630    Clinical Impression Statement  Patient tolerated today's treatment fairly well as he arrived with more discomfort after work activities. Patient guided through strengthening exercises with intermittant reports of R knee popping and crepitus. Patient requested for LAQ with 7.5# ankleweights which he said he could feel greater popping. Increased edema reported by patient in R knee and observed in B calves. Normal modalites response noted following removal of the modalities.    Rehab Potential  Good    PT Frequency  2x / week    PT Duration  8 weeks    PT Treatment/Interventions  ADLs/Self Care Home Management;Cryotherapy;Electrical Stimulation;Iontophoresis 4mg /ml Dexamethasone;Moist Heat;Ultrasound;Stair training;Therapeutic exercise;Neuromuscular re-education;Patient/family education;Manual techniques;Dry needling;Taping;Vasopneumatic Device    PT Next Visit Plan  flexibility; Painfree quad strenghening level 1; joint traction;     PT Home Exercise Plan  stretches: itb, quads, HF    Consulted and Agree with Plan of Care  Patient       Patient will benefit from skilled therapeutic intervention in order to improve the following deficits and impairments:  Pain, Impaired flexibility, Difficulty walking, Decreased range of motion  Visit Diagnosis: Stiffness of right knee, not elsewhere classified  Chronic pain of right knee     Problem List Patient Active Problem List   Diagnosis Date Noted  . Coronary artery disease involving native coronary artery of native heart without angina pectoris 04/25/2018  . Dyslipidemia 04/25/2018  . Acute on chronic systolic CHF (congestive heart failure) ([place]) 01/07/2018  . Benign neoplasm  of ascending  colon   . Benign neoplasm of transverse colon   . Benign neoplasm of descending colon   . Benign neoplasm of sigmoid colon   . Benign neoplasm of colon   . Vitamin D deficiency 11/02/2017  . Current smoker 09/28/2017  . Morbid obesity ([place]) 09/28/2017  . Peripheral edema 09/28/2017  . Hypertension 09/28/2017  . Heavy cigarette smoker (20-39 per day) 11/15/2015  . Hyperlipemia 12/28/2008  . GASTRIC ULCER, ACUTE, HEMORRHAGE 12/28/2008  . 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 Seizure disorder [facility]) 12/28/2008    Standley Brooking, PTA 06/02/2018, 9:54 AM  [facility] [address] [place], [place], [address] Phone: [phone]   Fax:  [phone]  Name: CHUKWUEMEKA ARTOLA MRN: [id number] Date of Birth: 1961-10-05

## 2018-06-07 ENCOUNTER — Encounter: Payer: Self-pay | Admitting: Physical Therapy

## 2018-06-07 ENCOUNTER — Ambulatory Visit: Payer: 59 | Admitting: Physical Therapy

## 2018-06-07 DIAGNOSIS — M25561 Pain in right knee: Secondary | ICD-10-CM

## 2018-06-07 DIAGNOSIS — G8929 Other chronic pain: Secondary | ICD-10-CM

## 2018-06-07 DIAGNOSIS — M25661 Stiffness of right knee, not elsewhere classified: Secondary | ICD-10-CM

## 2018-06-07 NOTE — Therapy (Signed)
[facility] [place], [place], [address] Phone: [phone]   Fax:  [phone]  Physical Therapy Treatment  Patient Details  Name: Melvin Villarreal MRN: [id number] Date of Birth: Oct 29, 1960 Referring Provider: Claretta Fraise   Encounter Date: 06/07/2018  PT End of Session - 06/07/18 0827    Visit Number  13    Number of Visits  16    Date for PT Re-Evaluation  06/21/18    PT Start Time  0814    PT Stop Time  0900    PT Time Calculation (min)  46 min    Activity Tolerance  Patient tolerated treatment well    Behavior During Therapy  [facility] for tasks assessed/performed       Past Medical History:  Diagnosis Date  . Hypertension   . Nephrolithiasis   . NEPHROLITHIASIS, HX OF 12/28/2008   Qualifier: Diagnosis of  By: Marland Mcalpine    . Seizures ([place])     Past Surgical History:  Procedure Laterality Date  . COLONOSCOPY WITH PROPOFOL N/A 12/14/2017   Procedure: COLONOSCOPY WITH PROPOFOL;  Surgeon: Yetta Flock, MD;  [facility]: [facility];  Service: Gastroenterology;  Laterality: N/A;  . HAND SURGERY Left    Collapsed vein  . RIGHT/LEFT HEART CATH AND CORONARY ANGIOGRAPHY N/A 01/12/2018   Procedure: RIGHT/LEFT HEART CATH AND CORONARY ANGIOGRAPHY;  Surgeon: Sherren Mocha, MD;  Location: [place] [facility];  Service: Cardiovascular;  Laterality: N/A;    There were no vitals filed for this visit.  Subjective Assessment - 06/07/18 0819    Subjective  Reports his knees are sore as he was on his knees and walked a lot last night.    Pertinent History  Heart problems, epilepsy    Diagnostic tests  none    Patient Stated Goals  get rid of pain    Currently in Pain?  Yes    Pain Score  --   No pain score provided   Pain Location  Knee    Pain Orientation  Right    Pain Descriptors / Indicators  Sore    Pain Type  Chronic pain    Pain Onset  More than a month ago         [facility] PT Assessment - 06/07/18 0001       Assessment   Medical Diagnosis  arthritis of R knee    Onset Date/Surgical Date  09/25/17    Next MD Visit  06/2018 for injections      Precautions   Precautions  None                   [facility] Adult PT Treatment/Exercise - 06/07/18 0001      Exercises   Exercises  Knee/Hip      Knee/Hip Exercises: Aerobic   Nustep  L7 x10 min      Knee/Hip Exercises: Standing   Hip Abduction  AROM;Right;20 reps;Knee straight    Forward Step Up  Right;2 sets;10 reps;Hand Hold: 2;Step Height: 6"      Knee/Hip Exercises: Seated   Long Arc Quad  Strengthening;Right;2 sets;10 reps;Weights    Long Arc Quad Weight  5 lbs.      Knee/Hip Exercises: Supine   Terminal Knee Extension  Strengthening;Right;15 reps;Theraband    Theraband Level (Terminal Knee Extension)  Level 3 (Green)    Straight Leg Raises  AROM;Right;20 reps    Straight Leg Raise with External Rotation  AROM;Right;20 reps  Modalities   Modalities  Theme park manager Action  IFC    Electrical Stimulation Parameters  80-150 hz x15 min    Electrical Stimulation Goals  Pain;Edema      Vasopneumatic   Number Minutes Vasopneumatic   15 minutes    Vasopnuematic Location   Knee    Vasopneumatic Pressure  Low    Vasopneumatic Temperature   34               PT Short Term Goals - 05/10/18 0908      PT SHORT TERM GOAL #1   Title  Ind and compliant with HEP for flexibility    Time  3    Period  Weeks    Status  On-going        PT Long Term Goals - 06/07/18 6812      PT LONG TERM GOAL #1   Title  Patient able to perform sit to stand transition without pain in R knee.    Baseline  currently intermittent pain    Time  8    Period  Weeks    Status  Partially Met   Only if he has UE support per patient report 06/07/2018     PT LONG TERM GOAL #2   Title  Patient able to ambulate community  distances with 3/10 pain or less.    Time  8    Period  Weeks    Status  Achieved      PT LONG TERM GOAL #3   Title  Patient able to walk down a ramp with minimal pain in the R knee.    Time  8    Status  On-going      PT LONG TERM GOAL #4   Title  Patient to demo negative Ober test on R knee to prevent further injury to knee.    Time  8    Period  Weeks    Status  On-going            Plan - 06/07/18 0856    Clinical Impression Statement  Patient tolerated today's treatment fairly well with reports of B knee soreness from work and weather. Patient able to complete exercises although at slow pace but with reports of low back discomfort as well. Good R quad activation noted today with exercises. Normal modalities response noted following removal of the modalities. Limited goal progression noted today due to pain with inclines.    Rehab Potential  Good    PT Frequency  2x / week    PT Duration  8 weeks    PT Treatment/Interventions  ADLs/Self Care Home Management;Cryotherapy;Electrical Stimulation;Iontophoresis '4mg'$ /ml Dexamethasone;Moist Heat;Ultrasound;Stair training;Therapeutic exercise;Neuromuscular re-education;Patient/family education;Manual techniques;Dry needling;Taping;Vasopneumatic Device    PT Next Visit Plan  flexibility; Painfree quad strenghening level 1; joint traction;     PT Home Exercise Plan  stretches: itb, quads, HF    Consulted and Agree with Plan of Care  Patient       Patient will benefit from skilled therapeutic intervention in order to improve the following deficits and impairments:  Pain, Impaired flexibility, Difficulty walking, Decreased range of motion  Visit Diagnosis: Stiffness of right knee, not elsewhere classified  Chronic pain of right knee     Problem List Patient Active Problem List   Diagnosis Date Noted  . Coronary artery disease involving native coronary artery of native  heart without angina pectoris 04/25/2018  . Dyslipidemia  04/25/2018  . Acute on chronic systolic CHF (congestive heart failure) ([place]) 01/07/2018  . Benign neoplasm of ascending colon   . Benign neoplasm of transverse colon   . Benign neoplasm of descending colon   . Benign neoplasm of sigmoid colon   . Benign neoplasm of colon   . Vitamin D deficiency 11/02/2017  . Current smoker 09/28/2017  . Morbid obesity ([place]) 09/28/2017  . Peripheral edema 09/28/2017  . Hypertension 09/28/2017  . Heavy cigarette smoker (20-39 per day) 11/15/2015  . Hyperlipemia 12/28/2008  . GASTRIC ULCER, ACUTE, HEMORRHAGE 12/28/2008  . 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 Seizure disorder [facility]) 12/28/2008    Standley Brooking, PTA 06/07/2018, 10:00 AM  [facility], [place], [address] Phone: [phone]   Fax:  [phone]  Name: Melvin Villarreal MRN: [id number] Date of Birth: 1961/02/13

## 2018-06-08 ENCOUNTER — Encounter: Payer: Self-pay | Admitting: Physician Assistant

## 2018-06-08 ENCOUNTER — Ambulatory Visit (INDEPENDENT_AMBULATORY_CARE_PROVIDER_SITE_OTHER): Payer: 59 | Admitting: Physician Assistant

## 2018-06-08 VITALS — BP 100/64 | HR 71 | Ht 75.0 in | Wt 378.0 lb

## 2018-06-08 DIAGNOSIS — I5022 Chronic systolic (congestive) heart failure: Secondary | ICD-10-CM

## 2018-06-08 DIAGNOSIS — I2510 Atherosclerotic heart disease of native coronary artery without angina pectoris: Secondary | ICD-10-CM | POA: Diagnosis not present

## 2018-06-08 DIAGNOSIS — I10 Essential (primary) hypertension: Secondary | ICD-10-CM

## 2018-06-08 LAB — BASIC METABOLIC PANEL
BUN/Creatinine Ratio: 32 — ABNORMAL HIGH (ref 9–20)
BUN: 51 mg/dL — AB (ref 6–24)
CALCIUM: 9.5 mg/dL (ref 8.7–10.2)
CO2: 21 mmol/L (ref 20–29)
CREATININE: 1.61 mg/dL — AB (ref 0.76–1.27)
Chloride: 100 mmol/L (ref 96–106)
GFR, EST AFRICAN AMERICAN: 55 mL/min/{1.73_m2} — AB (ref >59)
GFR, EST NON AFRICAN AMERICAN: 47 mL/min/{1.73_m2} — AB (ref >59)
Glucose: 100 mg/dL — ABNORMAL HIGH (ref 65–99)
POTASSIUM: 5.7 mmol/L — AB (ref 3.5–5.2)
Sodium: 140 mmol/L (ref 134–144)

## 2018-06-08 MED ORDER — CARVEDILOL 12.5 MG PO TABS: 12.5000 mg | ORAL_TABLET | Freq: Two times a day (BID) | ORAL | 6 refills | Status: DC

## 2018-06-08 NOTE — Progress Notes (Signed)
Cardiology Office Note   Date:  06/08/2018   ID:  Melvin Villarreal, DOB [birth year], MRN [id number]  PCP:  Melvin Fraise, MD  Cardiologist: Dr. Percival Spanish, 04/25/2018 Rosaria Ferries, PA-C 01/20/2018  No chief complaint on file.   History of Present Illness: REFAEL Villarreal is a 57 y.o. male with a history of HTN, nephrolithiasis, seizures, S-D-CHF, intol Lipitor, tob use  7/15 office visit, weight 367 pounds, amlodipine reduced to 5 mg a day, Coreg increased to 6.25 mg twice daily, pravastatin 80 mg added, he had quit smoking, f/u for med titration  Melvin Villarreal presents for cardiology follow up.  His weight is up 9 lbs, feels it is food. He has been tracking his weight, the increase has been gradual.  His BP is lower than usual, he feels tired and has no energy.   Is compliant with his medications.   Has never been told that he snores. He does not get good rest because he takes the torsemide 40 mg before he goes to bed. He gets up several times to urinate.   Legs are not swelling, denies orthopnea. Breathing better in general.   Never gets chest pain. Has to exert himself at work, walks on concrete. His legs hurt at times.   Has not smoked since March 29th.   Past Medical History:  Diagnosis Date  . Hypertension   . Nephrolithiasis   . NEPHROLITHIASIS, HX OF 12/28/2008   Qualifier: Diagnosis of  By: Marland Mcalpine    . Seizures ([place])     Past Surgical History:  Procedure Laterality Date  . COLONOSCOPY WITH PROPOFOL N/A 12/14/2017   Procedure: COLONOSCOPY WITH PROPOFOL;  Surgeon: Yetta Flock, MD;  [facility];  Service: Gastroenterology;  Laterality: N/A;  . HAND SURGERY Left    Collapsed vein  . RIGHT/LEFT HEART CATH AND CORONARY ANGIOGRAPHY N/A 01/12/2018   Procedure: RIGHT/LEFT HEART CATH AND CORONARY ANGIOGRAPHY;  Surgeon: Sherren Mocha, MD;  Location: [facility];  Service: Cardiovascular;  Laterality: N/A;    Current  Outpatient Medications  Medication Sig Dispense Refill  . acetaminophen (TYLENOL) 650 MG CR tablet Take 1,300 mg by mouth every 8 (eight) hours as needed for pain.    [name] amLODipine (NORVASC) 5 MG tablet Take 1 tablet (5 mg total) by mouth at bedtime. 30 tablet 6  . aspirin EC 81 MG EC tablet Take 1 tablet (81 mg total) by mouth daily. 90 tablet 3  . carvedilol (COREG) 6.25 MG tablet Take 1 tablet (6.25 mg total) by mouth 2 (two) times daily with a meal. 60 tablet 6  . linaclotide (LINZESS) 290 MCG CAPS capsule Take 1 capsule (290 mcg total) by mouth daily before breakfast. 30 capsule 5  . lisinopril (PRINIVIL,ZESTRIL) 40 MG tablet TAKE 1 TABLET BY MOUTH EVERY DAY 30 tablet 5  . phenytoin (DILANTIN) 100 MG ER capsule Take 700 mg by mouth at bedtime.   0  . potassium chloride SA (K-DUR,KLOR-CON) 20 MEQ tablet Take 1 tablet (20 mEq total) by mouth 2 (two) times daily. 60 tablet 6  . pravastatin (PRAVACHOL) 80 MG tablet Take 1 tablet (80 mg total) by mouth every evening. 30 tablet 6  . spironolactone (ALDACTONE) 25 MG tablet Take 1 tablet (25 mg total) by mouth daily. 90 tablet 3  . torsemide (DEMADEX) 20 MG tablet Take 40 mg (2 tablets) in the morning and 20 mg (1 tablet) in the evening. 90 tablet 6  . Vitamin  D, Ergocalciferol, (DRISDOL) 50000 units CAPS capsule Take 1 capsule (50,000 Units total) by mouth every 7 (seven) days. (Patient taking differently: Take 50,000 Units by mouth every Tuesday. ) 12 capsule 3   No current facility-administered medications for this visit.     Allergies:   Patient has no known allergies.    Social History:  The patient  reports that he has quit smoking. His smoking use included cigarettes. He has a 52.50 pack-year smoking history. He has never used smokeless tobacco. He reports that he does not drink alcohol or use drugs.   Family History:  The patient's family history includes Cancer (age of onset: 50) in his mother; Heart failure (age of onset: 19) in his  brother.    ROS:  Please see the history of present illness. All other systems are reviewed and negative.    PHYSICAL EXAM: VS:  BP 100/64   Pulse 71   Ht 6\' 3"  (1.905 m)   Wt (!) 378 lb (171.5 kg)   BMI 47.25 kg/m  , BMI Body mass index is 47.25 kg/m. GEN: Well nourished, well developed, male in no acute distress  HEENT: normal for age  Neck: no JVD, no carotid bruit, no masses Cardiac: RRR; no murmur, no rubs, or gallops Respiratory:  clear to auscultation bilaterally, normal work of breathing GI: soft, nontender, nondistended, + BS MS: no deformity or atrophy; no edema; distal pulses are 2+ in all 4 extremities   Skin: warm and dry, no rash Neuro:  Strength and sensation are intact Psych: euthymic mood, full affect   EKG:  EKG is not ordered today.  Right and left heart cath 01/12/18:  Prox RCA lesion is 95% stenosed.  Mid RCA lesion is 95% stenosed.  Dist RCA lesion is 80% stenosed.  Mid LM to Dist LM lesion is 40% stenosed.  Ost LAD to Prox LAD lesion is 50% stenosed.  Prox LAD to Mid LAD lesion is 50% stenosed.  Prox Cx to Mid Cx lesion is 70% stenosed.  Ost 1st Mrg to 1st Mrg lesion is 30% stenosed.  The left ventricular ejection fraction is 45-50% by visual estimate.  LV end diastolic pressure is normal.  Hemodynamic findings consistent with moderate pulmonary hypertension.  LV end diastolic pressure is normal. 1. Moderate pulmonary hypertension with pulmonary vascular resistance approximately 3 Wood units 2. Multivessel coronary artery disease with chronic subtotal occlusion of the RCA with multiple severe stenoses and left to right collateral filling of the distal branch vessels, moderate irregular stenoses of the LAD, and moderate stenosis of the AV circumflex 3. Normal LVEDP Recommendations: The patient appears to primarily have hemodynamic findings consistent with right heart failure. Fortunately his cardiac output is preserved. I would  suggest aggressive medical therapy for his coronary artery disease. Diagnostic Diagram       Echo 12/29/17: Study Conclusions - Left ventricle: Diffuse hypokinesis with abnormal septal motion. The cavity size was normal. Wall thickness was increased in a pattern of moderate LVH. Systolic function was mildly to moderately reduced. The estimated ejection fraction was in the range of 40% to 45%. - Left atrium: The atrium was mildly dilated. - Right ventricle: The cavity size was moderately dilated. - Right atrium: The atrium was moderately dilated. - Atrial septum: No defect or patent foramen ovale was identified. - Pulmonary arteries: PA peak pressure: 55 mm Hg (S)    Recent Labs: 01/07/2018: B Natriuretic Peptide 230.4; TSH 1.599 04/19/2018: ALT 12; BUN 43; Creatinine, Ser 1.36; Hemoglobin  WILL FOLLOW; Platelets WILL FOLLOW; Potassium 4.7; Sodium 140    Lipid Panel    Component Value Date/Time   CHOL 186 11/01/2017 0928   TRIG 64 11/01/2017 0928   HDL 46 11/01/2017 0928   CHOLHDL 4.0 11/01/2017 0928   LDLCALC 127 (H) 11/01/2017 0928     Wt Readings from Last 3 Encounters:  06/08/18 (!) 378 lb (171.5 kg)  04/25/18 (!) 367 lb 9.6 oz (166.7 kg)  04/19/18 (!) 365 lb 2 oz (165.6 kg)     Other studies Reviewed: Additional studies/ records that were reviewed today include: Office notes, hospital records and testing  ASSESSMENT AND PLAN:  1.  CAD: -Continue aspirin, lisinopril and statin. - Continue beta-blocker, increase the dose -he is not having ischemic symptoms. -If he develops ischemic symptoms, add Imdur, otherwise no changes  2.  Chronic systolic CHF (no mention of diastolic dysfunction on echo) - His weight is increased, but the increase is gradually according to him and he does not have significant volume overload by exam.  I feel that he has gained actual weight. - He works third shift, but is taking the torsemide 40 mg before he goes to bed in the  morning.  He takes the second dose before he goes to work in the evening. - Suggested he move the torsemide dose up to after he gets up, he can either take it when he first gets up, or before he goes to work.  I feel that this will be better for him and he will get more rest.  He is agreeable to this. - I explained that the torsemide doses need to be 6 hours apart, scheduling them so that he can get more rest would probably be helpful to him -Continue torsemide, Spironolactone, potassium, carvedilol, and lisinopril -Check a BMET today  3.  Possible sleep apnea: He is not interested in a sleep study right now. -Advised him that after he changes his torsemide dose, he can pay attention to if he feels rested when he wakes up and if he is waking up during the "night" because of his breathing. - If he is waking up during his sleep time with shortness of breath, he understands that he will need to get the sleep study.  4.  Hypertension: - His blood pressure may be on the low side of normal at times and that may be making him feel tired. -Stop the amlodipine and increase the carvedilol to 12.5 mg twice daily.  He is to let us know if his symptoms do not improve. - He is to follow his blood pressure at home, if his blood pressure drops too low, it is okay to cut the carvedilol back to the previous dose.   Current medicines are reviewed at length with the patient today.  The patient has concerns regarding medicines.  The following changes have been made: Stop amlodipine, increase carvedilol  Labs/ tests ordered today include:   Orders Placed This Encounter  Procedures  . Basic metabolic panel     Disposition:   FU with Dr. Percival Spanish  Signed, Rosaria Ferries, PA-C  06/08/2018 1:40 PM    [place] Phone: [phone]; Fax: [phone]  This note was written with the assistance of speech recognition software. Please excuse any transcriptional errors.

## 2018-06-08 NOTE — Patient Instructions (Signed)
Medication Instructions  Increase CARVEDILOL 12.5 mg   One tablet twice a day.  May take  torsemide on your schedule- make sure that you take the doses 6 hours apart.    LABS BMP  TODAY    Your physician recommends that you schedule a follow-up appointment in [place].  If you need a refill on your cardiac medications before your next appointment, please call your pharmacy.

## 2018-06-09 ENCOUNTER — Encounter: Payer: Self-pay | Admitting: Physical Therapy

## 2018-06-09 ENCOUNTER — Ambulatory Visit: Payer: 59 | Admitting: Physical Therapy

## 2018-06-09 DIAGNOSIS — G8929 Other chronic pain: Secondary | ICD-10-CM

## 2018-06-09 DIAGNOSIS — M25661 Stiffness of right knee, not elsewhere classified: Secondary | ICD-10-CM

## 2018-06-09 DIAGNOSIS — M25561 Pain in right knee: Secondary | ICD-10-CM

## 2018-06-09 NOTE — Therapy (Signed)
[facility] [place], [place], [address] Phone: [phone]   Fax:  [phone]  Physical Therapy Treatment  Patient Details  Name: Melvin Villarreal MRN: [id number] Date of Birth: 1961/02/25 Referring Provider: Claretta Fraise   Encounter Date: 06/09/2018  PT End of Session - 06/09/18 0837    Visit Number  14    Number of Visits  16    Date for PT Re-Evaluation  06/21/18    PT Start Time  0819    PT Stop Time  0905    PT Time Calculation (min)  46 min    Activity Tolerance  Patient tolerated treatment well    Behavior During Therapy  [facility] for tasks assessed/performed       Past Medical History:  Diagnosis Date  . Hypertension   . Nephrolithiasis   . NEPHROLITHIASIS, HX OF 12/28/2008   Qualifier: Diagnosis of  By: Marland Mcalpine    . Seizures ([place])     Past Surgical History:  Procedure Laterality Date  . COLONOSCOPY WITH PROPOFOL N/A 12/14/2017   Procedure: COLONOSCOPY WITH PROPOFOL;  Surgeon: Yetta Flock, MD;  [facility];  Service: Gastroenterology;  Laterality: N/A;  . HAND SURGERY Left    Collapsed vein  . RIGHT/LEFT HEART CATH AND CORONARY ANGIOGRAPHY N/A 01/12/2018   Procedure: RIGHT/LEFT HEART CATH AND CORONARY ANGIOGRAPHY;  Surgeon: Sherren Mocha, MD;  Location: [facility];  Service: Cardiovascular;  Laterality: N/A;    There were no vitals filed for this visit.  Subjective Assessment - 06/09/18 0820    Subjective  Reports that his calves are tight. Reports doing a lot more yesterday at work and they took away his help.    Pertinent History  Heart problems, epilepsy    Diagnostic tests  none    Patient Stated Goals  get rid of pain    Currently in Pain?  Yes    Pain Score  4     Pain Location  Knee    Pain Orientation  Right    Pain Descriptors / Indicators  Discomfort    Pain Type  Chronic pain    Pain Onset  More than a month ago         [facility] PT Assessment -  06/09/18 0001      Assessment   Medical Diagnosis  arthritis of R knee    Onset Date/Surgical Date  09/25/17    Next MD Visit  06/2018 for injections      Precautions   Precautions  None                   [facility] Adult PT Treatment/Exercise - 06/09/18 0001      Exercises   Exercises  Knee/Hip      Knee/Hip Exercises: Aerobic   Nustep  L7 x10 min      Knee/Hip Exercises: Standing   Heel Raises  Both;2 sets;10 reps    Hip Abduction  AROM;Right;20 reps;Knee straight    Forward Step Up  Right;2 sets;10 reps;Hand Hold: 2;Step Height: 6"    Rocker Board  4 minutes      Knee/Hip Exercises: Seated   Long Arc Quad  Strengthening;Right;3 sets;10 reps;Weights    Long Arc Quad Weight  5 lbs.      Knee/Hip Exercises: Supine   Straight Leg Raises  AROM;Right;20 reps      Modalities   Modalities  Firefighter  Electrical Stimulation Location  R knee    Electrical Stimulation Action  IFC    Electrical Stimulation Parameters  80-150 hz x15 min    Electrical Stimulation Goals  Pain;Edema      Vasopneumatic   Number Minutes Vasopneumatic   15 minutes    Vasopnuematic Location   Knee    Vasopneumatic Pressure  Low    Vasopneumatic Temperature   34               PT Short Term Goals - 06/09/18 0859      PT SHORT TERM GOAL #1   Title  Ind and compliant with HEP for flexibility    Time  3    Period  Weeks    Status  Achieved        PT Long Term Goals - 06/07/18 1610      PT LONG TERM GOAL #1   Title  Patient able to perform sit to stand transition without pain in R knee.    Baseline  currently intermittent pain    Time  8    Period  Weeks    Status  Partially Met   Only if he has UE support per patient report 06/07/2018     PT LONG TERM GOAL #2   Title  Patient able to ambulate community distances with 3/10 pain or less.    Time  8    Period  Weeks    Status  Achieved      PT LONG TERM GOAL #3    Title  Patient able to walk down a ramp with minimal pain in the R knee.    Time  8    Status  On-going      PT LONG TERM GOAL #4   Title  Patient to demo negative Ober test on R knee to prevent further injury to knee.    Time  8    Period  Weeks    Status  On-going            Plan - 06/09/18 9604    Clinical Impression Statement  Patient tolerated today's treatment fairly well although reported greater fatigue today and muscle cramping. Patient had no complaints of pain with any exercises only of fatigue. Patient continues to report discomfort with prolonged walking and today with climbing ladders. Normal modalities response noted following removal of the modalities.    Rehab Potential  Good    PT Frequency  2x / week    PT Duration  8 weeks    PT Treatment/Interventions  ADLs/Self Care Home Management;Cryotherapy;Electrical Stimulation;Iontophoresis 71m/ml Dexamethasone;Moist Heat;Ultrasound;Stair training;Therapeutic exercise;Neuromuscular re-education;Patient/family education;Manual techniques;Dry needling;Taping;Vasopneumatic Device    PT Next Visit Plan  flexibility; Painfree quad strenghening level 1; joint traction;     PT Home Exercise Plan  stretches: itb, quads, HF    Consulted and Agree with Plan of Care  Patient       Patient will benefit from skilled therapeutic intervention in order to improve the following deficits and impairments:  Pain, Impaired flexibility, Difficulty walking, Decreased range of motion  Visit Diagnosis: Stiffness of right knee, not elsewhere classified  Chronic pain of right knee     Problem List Patient Active Problem List   Diagnosis Date Noted  . Coronary artery disease involving native coronary artery of native heart without angina pectoris 04/25/2018  . Dyslipidemia 04/25/2018  . Acute on chronic systolic CHF (congestive heart failure) ([place] 01/07/2018  . Benign neoplasm of ascending colon   .  Benign neoplasm of transverse colon    . Benign neoplasm of descending colon   . Benign neoplasm of sigmoid colon   . Benign neoplasm of colon   . Vitamin D deficiency 11/02/2017  . Current smoker 09/28/2017  . Morbid obesity ([place]) 09/28/2017  . Peripheral edema 09/28/2017  . Hypertension 09/28/2017  . Heavy cigarette smoker (20-39 per day) 11/15/2015  . Hyperlipemia 12/28/2008  . GASTRIC ULCER, ACUTE, HEMORRHAGE 12/28/2008  . 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 Seizure disorder [facility]) 12/28/2008    Standley Brooking, PTA 06/09/2018, 9:34 AM  [facility] [address] [place], [place], [address] Phone: [phone]   Fax:  [phone]  Name: Melvin Villarreal MRN: [id number] Date of Birth: June 22, 1961

## 2018-06-11 ENCOUNTER — Other Ambulatory Visit: Payer: Self-pay | Admitting: Family Medicine

## 2018-06-15 ENCOUNTER — Ambulatory Visit: Payer: 59 | Attending: Family Medicine | Admitting: Physical Therapy

## 2018-06-15 ENCOUNTER — Encounter: Payer: Self-pay | Admitting: Physical Therapy

## 2018-06-15 DIAGNOSIS — M25561 Pain in right knee: Secondary | ICD-10-CM | POA: Diagnosis present

## 2018-06-15 DIAGNOSIS — G8929 Other chronic pain: Secondary | ICD-10-CM | POA: Insufficient documentation

## 2018-06-15 DIAGNOSIS — M25661 Stiffness of right knee, not elsewhere classified: Secondary | ICD-10-CM | POA: Diagnosis present

## 2018-06-15 NOTE — Therapy (Signed)
[facility] [place], [place], [address] Phone: [phone]   Fax:  [phone]  Physical Therapy Treatment  Patient Details  Name: Melvin Villarreal MRN: [id number] Date of Birth: 10-10-1961 Referring Provider: Claretta Fraise   Encounter Date: 06/15/2018  PT End of Session - 06/15/18 0838    Visit Number  15    Number of Visits  16    Date for PT Re-Evaluation  06/21/18    PT Start Time  0820    PT Stop Time  0901    PT Time Calculation (min)  41 min    Activity Tolerance  Patient tolerated treatment well    Behavior During Therapy  [facility] for tasks assessed/performed       Past Medical History:  Diagnosis Date  . Hypertension   . Nephrolithiasis   . NEPHROLITHIASIS, HX OF 12/28/2008   Qualifier: Diagnosis of  By: Marland Mcalpine    . Seizures ([place])     Past Surgical History:  Procedure Laterality Date  . COLONOSCOPY WITH PROPOFOL N/A 12/14/2017   Procedure: COLONOSCOPY WITH PROPOFOL;  Surgeon: Yetta Flock, MD;  [facility];  Service: Gastroenterology;  Laterality: N/A;  . HAND SURGERY Left    Collapsed vein  . RIGHT/LEFT HEART CATH AND CORONARY ANGIOGRAPHY N/A 01/12/2018   Procedure: RIGHT/LEFT HEART CATH AND CORONARY ANGIOGRAPHY;  Surgeon: Sherren Mocha, MD;  Location: [place] [facility];  Service: Cardiovascular;  Laterality: N/A;    There were no vitals filed for this visit.  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     Pertinent History  Heart problems, epilepsy    Diagnostic tests  none    Patient Stated Goals  get rid of pain    Currently in Pain?  Yes    Pain Score  5     Pain Location  Knee    Pain Orientation  Right    Pain Descriptors / Indicators  Discomfort    Pain Type  Chronic pain    Pain Onset  More than a month ago         [facility] PT Assessment  - 06/15/18 0001      Assessment   Medical Diagnosis  arthritis of R knee    Onset Date/Surgical Date  09/25/17    Next MD Visit  06/2018 for injections      Precautions   Precautions  None                   [facility] Adult PT Treatment/Exercise - 06/15/18 0001      Exercises   Exercises  Knee/Hip      Knee/Hip Exercises: Aerobic   Nustep  L7 x10 min      Knee/Hip Exercises: Seated   Long Arc Quad  Strengthening;Right;3 sets;10 reps;Weights    Long Arc Quad Weight  5 lbs.      Knee/Hip Exercises: Supine   Straight Leg Raises  AROM;Right;20 reps    Straight Leg Raise with External Rotation  AROM;Right;20 reps      Modalities   Modalities  Theme park manager Action  IFC    Electrical Stimulation Parameters  80-150 hz x15 min    Electrical Stimulation Goals  Pain;Edema      Vasopneumatic   Number Minutes Vasopneumatic   15 minutes    Vasopnuematic Location   Knee    Vasopneumatic Pressure  Low    Vasopneumatic Temperature   34               PT Short Term Goals - 06/09/18 0859      PT SHORT TERM GOAL #1   Title  Ind and compliant with HEP for flexibility    Time  3    Period  Weeks    Status  Achieved        PT Long Term Goals - 06/07/18 8295      PT LONG TERM GOAL #1   Title  Patient able to perform sit to stand transition without pain in R knee.    Baseline  currently intermittent pain    Time  8    Period  Weeks    Status  Partially Met   Only if he has UE support per patient report 06/07/2018     PT LONG TERM GOAL #2   Title  Patient able to ambulate community distances with 3/10 pain or less.    Time  8    Period  Weeks    Status  Achieved      PT LONG TERM GOAL #3   Title  Patient able to walk down a ramp with minimal pain in the R knee.    Time  8    Status  On-going      PT LONG TERM GOAL #4   Title  Patient to  demo negative Ober test on R knee to prevent further injury to knee.    Time  8    Period  Weeks    Status  On-going            Plan - 06/15/18 0855    Clinical Impression Statement  Patient arrived in clinic with increased R knee pain and fatigue from increased work last night. Patient overall very fatigued from work as well. Patient overall limited due to the fatigue. Normal modalities response noted following removal of the modalities.     Rehab Potential  Good    PT Frequency  2x / week    PT Duration  8 weeks    PT Treatment/Interventions  ADLs/Self Care Home Management;Cryotherapy;Electrical Stimulation;Iontophoresis 60m/ml Dexamethasone;Moist Heat;Ultrasound;Stair training;Therapeutic exercise;Neuromuscular re-education;Patient/family education;Manual techniques;Dry needling;Taping;Vasopneumatic Device    PT Next Visit Plan  D/c next treatment.    PT Home Exercise Plan  stretches: itb, quads, HF    Consulted and Agree with Plan of Care  Patient       Patient will benefit from skilled therapeutic intervention in order to improve the following deficits and impairments:  Pain, Impaired flexibility, Difficulty walking, Decreased range of motion  Visit Diagnosis: Stiffness of right knee, not elsewhere classified  Chronic pain of right knee     Problem List Patient Active Problem List   Diagnosis Date Noted  . Coronary artery disease involving native coronary artery of native heart without angina pectoris 04/25/2018  . Dyslipidemia 04/25/2018  . Acute on chronic systolic CHF (congestive heart failure) ([facility] [facility] 01/07/2018  . Benign neoplasm of ascending colon   . Benign neoplasm of transverse colon   . Benign neoplasm of descending colon   . Benign neoplasm of sigmoid colon   . Benign neoplasm of colon   . Vitamin D deficiency 11/02/2017  . Current smoker 09/28/2017  . Morbid  obesity ([place]) 09/28/2017  . Peripheral edema 09/28/2017  . Hypertension 09/28/2017  . Heavy  cigarette smoker (20-39 per day) 11/15/2015  . Hyperlipemia 12/28/2008  . GASTRIC ULCER, ACUTE, HEMORRHAGE 12/28/2008  . 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 Seizure disorder [facility]) 12/28/2008    Standley Brooking, PTA 06/15/2018, 9:47 AM  [facility] [address] [place], [place], [address] Phone: [phone]   Fax:  [phone]  Name: Melvin Villarreal MRN: [id number] Date of Birth: 1961/06/26

## 2018-06-16 ENCOUNTER — Other Ambulatory Visit: Payer: Self-pay

## 2018-06-16 DIAGNOSIS — I5023 Acute on chronic systolic (congestive) heart failure: Secondary | ICD-10-CM

## 2018-06-16 MED ORDER — TORSEMIDE 20 MG PO TABS: 40.0000 mg | ORAL_TABLET | Freq: Every day | ORAL | 6 refills | Status: DC

## 2018-06-16 MED ORDER — POTASSIUM CHLORIDE CRYS ER 20 MEQ PO TBCR: 20.0000 meq | EXTENDED_RELEASE_TABLET | Freq: Every day | ORAL | 6 refills | Status: DC

## 2018-06-16 NOTE — Progress Notes (Signed)
Patient notified of medication change and voiced understanding

## 2018-06-17 ENCOUNTER — Encounter: Payer: Self-pay | Admitting: Physical Therapy

## 2018-06-17 ENCOUNTER — Ambulatory Visit: Payer: 59 | Admitting: Physical Therapy

## 2018-06-17 DIAGNOSIS — M25661 Stiffness of right knee, not elsewhere classified: Secondary | ICD-10-CM | POA: Diagnosis not present

## 2018-06-17 DIAGNOSIS — M25561 Pain in right knee: Secondary | ICD-10-CM

## 2018-06-17 DIAGNOSIS — G8929 Other chronic pain: Secondary | ICD-10-CM

## 2018-06-17 NOTE — Therapy (Signed)
[facility] [address] [place], [place], [address] Phone: [phone]   Fax:  [phone]  Physical Therapy Treatment  Patient Details  Name: Melvin Villarreal MRN: [id number] Date of Birth: 03/04/1961 Referring Provider: Warren Stacks   Encounter Date: 06/17/2018  PT End of Session - 06/17/18 0818    Visit Number  16    Number of Visits  16    Date for PT Re-Evaluation  06/21/18    PT Start Time  0817    PT Stop Time  0908    PT Time Calculation (min)  51 min    Activity Tolerance  Patient tolerated treatment well    Behavior During Therapy  WFL for tasks assessed/performed       Past Medical History:  Diagnosis Date  . Hypertension   . Nephrolithiasis   . NEPHROLITHIASIS, HX OF 12/28/2008   Qualifier: Diagnosis of  By: Smith NCMA, Barbara    . Seizures (HCC)     Past Surgical History:  Procedure Laterality Date  . COLONOSCOPY WITH PROPOFOL N/A 12/14/2017   Procedure: COLONOSCOPY WITH PROPOFOL;  Surgeon: Armbruster, Steven P, MD;  Location: [facility];  Service: Gastroenterology;  Laterality: N/A;  . HAND SURGERY Left    Collapsed vein  . RIGHT/LEFT HEART CATH AND CORONARY ANGIOGRAPHY N/A 01/12/2018   Procedure: RIGHT/LEFT HEART CATH AND CORONARY ANGIOGRAPHY;  Surgeon: Cooper, Michael, MD;  [facility]: [facility];  Service: Cardiovascular;  Laterality: N/A;    There were no vitals filed for this visit.  Subjective Assessment - 06/17/18 0818    Subjective  Reports that he does have pain with inclines when ambulating and has pain with ramp at work.    Pertinent History  Heart problems, epilepsy    Diagnostic tests  none    Patient Stated Goals  get rid of pain    Currently in Pain?  Other (Comment)   No pain assessment provided by patient        [facility] PT Assessment - 06/17/18 0001      Assessment   Medical Diagnosis  arthritis of R knee    Onset Date/Surgical Date  09/25/17    Next MD Visit  06/2018 for  injections      Precautions   Precautions  None                   [facility] Adult PT Treatment/Exercise - 06/17/18 0001      Ambulation/Gait   Ramp  7: Independent   slower pace but rquires UE support by rail     Exercises   Exercises  Knee/Hip      Knee/Hip Exercises: Stretches   Hip Flexor Stretch  Right;3 reps;30 seconds    Other Knee/Hip Stretches  L SL RLE off EOB 3x30 sec      Knee/Hip Exercises: Aerobic   Nustep  L7 x10 min      Knee/Hip Exercises: Seated   Long Arc Quad  Strengthening;Right;3 sets;10 reps;Weights    Long Arc Quad Weight  --   7.5     Knee/Hip Exercises: Supine   Terminal Knee Extension  Strengthening;Right;20 reps;Theraband    Theraband Level (Terminal Knee Extension)  Level 3 (Green)    Straight Leg Raises  AROM;Right;20 reps    Straight Leg Raise with External Rotation  AROM;Right;20 reps               PT Short Term Goals - 06/09/18 0859        PT SHORT TERM GOAL #1   Title  Ind and compliant with HEP for flexibility    Time  3    Period  Weeks    Status  Achieved        PT Long Term Goals - 06/17/18 4098      PT LONG TERM GOAL #1   Title  Patient able to perform sit to stand transition without pain in R knee.    Baseline  currently intermittent pain    Time  8    Period  Weeks    Status  Not Met   Same pain rating as prior to PT 06/17/2018     PT LONG TERM GOAL #2   Title  Patient able to ambulate community distances with 3/10 pain or less.    Time  8    Period  Weeks    Status  Achieved      PT LONG TERM GOAL #3   Title  Patient able to walk down a ramp with minimal pain in the R knee.    Time  8    Status  Achieved      PT LONG TERM GOAL #4   Title  Patient to demo negative Ober test on R knee to prevent further injury to knee.    Time  8    Period  Weeks    Status  Not Met   + test per Gabriela Eves, DPT 06/17/2018           Plan - 06/17/18 0906    Clinical Impression Statement  Patient has  made minimal improvements since beginning PT. Patient still limited with R hip flexibility and the on-going R knee pain with ramps, stairs, and sit to stand transitions. Able to achieve goals in regards to ambulationg and ramp goal for today but patient reports intermittant pain. Discharge FOTO score 44% and discharge status: CK. Normal modalities response noted following removal of the modalities.    Rehab Potential  Good    PT Frequency  2x / week    PT Duration  8 weeks    PT Treatment/Interventions  ADLs/Self Care Home Management;Cryotherapy;Electrical Stimulation;Iontophoresis 48m/ml Dexamethasone;Moist Heat;Ultrasound;Stair training;Therapeutic exercise;Neuromuscular re-education;Patient/family education;Manual techniques;Dry needling;Taping;Vasopneumatic Device    PT Next Visit Plan  D/C summary required.    PT Home Exercise Plan  stretches: itb, quads, HF    Consulted and Agree with Plan of Care  Patient       Patient will benefit from skilled therapeutic intervention in order to improve the following deficits and impairments:  Pain, Impaired flexibility, Difficulty walking, Decreased range of motion  Visit Diagnosis: Stiffness of right knee, not elsewhere classified  Chronic pain of right knee     Problem List Patient Active Problem List   Diagnosis Date Noted  . Coronary artery disease involving native coronary artery of native heart without angina pectoris 04/25/2018  . Dyslipidemia 04/25/2018  . Acute on chronic systolic CHF (congestive heart failure) ([place] 01/07/2018  . Benign neoplasm of ascending colon   . Benign neoplasm of transverse colon   . Benign neoplasm of descending colon   . Benign neoplasm of sigmoid colon   . Benign neoplasm of colon   . Vitamin D deficiency 11/02/2017  . Current smoker 09/28/2017  . Morbid obesity ([place] 09/28/2017  . Peripheral edema 09/28/2017  . Hypertension 09/28/2017  . Heavy cigarette smoker (20-39 per day) 11/15/2015  .  Hyperlipemia 12/28/2008  . GASTRIC ULCER, ACUTE, HEMORRHAGE 12/28/2008  .  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 Seizure disorder (HCC) 12/28/2008    PHYSICAL THERAPY DISCHARGE SUMMARY  Visits from Start of Care: 16  Current functional level related to goals / functional outcomes: See above   Remaining deficits: See goals  Education / Equipment: HEP Plan: Patient agrees to discharge.  Patient goals were partially met. Patient is being discharged due to lack of progress.  ?????      Kelsey P Kennon, PTA 06/17/18 9:29 AM   [facility] [address] [place], [place], [address] Phone: [phone]   Fax:  [phone]  Name: Melvin Villarreal MRN: [id number] Date of Birth: 12/17/1960   

## 2018-07-22 ENCOUNTER — Encounter: Payer: Self-pay | Admitting: Family Medicine

## 2018-07-22 ENCOUNTER — Ambulatory Visit (INDEPENDENT_AMBULATORY_CARE_PROVIDER_SITE_OTHER): Payer: 59 | Admitting: Family Medicine

## 2018-07-22 VITALS — BP 90/56 | HR 62 | Temp 97.1°F | Ht 75.0 in | Wt 384.0 lb

## 2018-07-22 DIAGNOSIS — F17200 Nicotine dependence, unspecified, uncomplicated: Secondary | ICD-10-CM | POA: Diagnosis not present

## 2018-07-22 DIAGNOSIS — E785 Hyperlipidemia, unspecified: Secondary | ICD-10-CM | POA: Diagnosis not present

## 2018-07-22 DIAGNOSIS — I10 Essential (primary) hypertension: Secondary | ICD-10-CM

## 2018-07-22 DIAGNOSIS — G40909 Epilepsy, unspecified, not intractable, without status epilepticus: Secondary | ICD-10-CM | POA: Diagnosis not present

## 2018-07-22 DIAGNOSIS — M129 Arthropathy, unspecified: Secondary | ICD-10-CM | POA: Diagnosis not present

## 2018-07-22 DIAGNOSIS — F17210 Nicotine dependence, cigarettes, uncomplicated: Secondary | ICD-10-CM

## 2018-07-22 MED ORDER — CARVEDILOL 12.5 MG PO TABS: 12.5000 mg | ORAL_TABLET | Freq: Two times a day (BID) | ORAL | 6 refills | Status: DC

## 2018-07-22 MED ORDER — AMLODIPINE BESYLATE 5 MG PO TABS: 5.0000 mg | ORAL_TABLET | Freq: Every day | ORAL | 6 refills | Status: DC

## 2018-07-22 MED ORDER — TORSEMIDE 20 MG PO TABS: 40.0000 mg | ORAL_TABLET | Freq: Every day | ORAL | 6 refills | Status: AC

## 2018-07-22 MED ORDER — POTASSIUM CHLORIDE CRYS ER 20 MEQ PO TBCR: 20.0000 meq | EXTENDED_RELEASE_TABLET | Freq: Every day | ORAL | 6 refills | Status: DC

## 2018-07-22 MED ORDER — SPIRONOLACTONE 25 MG PO TABS: 25.0000 mg | ORAL_TABLET | Freq: Every day | ORAL | 3 refills | Status: DC

## 2018-07-22 MED ORDER — PHENYTOIN SODIUM EXTENDED 100 MG PO CAPS: ORAL_CAPSULE | ORAL | 5 refills | Status: AC

## 2018-07-22 MED ORDER — PRAVASTATIN SODIUM 80 MG PO TABS: 80.0000 mg | ORAL_TABLET | Freq: Every evening | ORAL | 6 refills | Status: DC

## 2018-07-22 NOTE — Addendum Note (Signed)
Addended by: Ilean China on: 07/22/2018 05:37 PM   Modules accepted: Orders

## 2018-07-22 NOTE — Progress Notes (Signed)
Subjective:  Patient ID: JRUE JARRIEL, male    DOB: 03/29/1961  Age: 57 y.o. MRN: [id number]  CC: Hyperlipidemia; Hypertension; and Nevus (on back of neck that he almost ripped off)   HPI Melvin Villarreal presents for  follow-up of hypertension. Patient has no history of headache chest pain or shortness of breath or recent cough. Patient also denies symptoms of TIA such as focal numbness or weakness. Patient denies side effects from medication. States taking it regularly.   History Melvin Villarreal has a past medical history of Current smoker (09/28/2017), GASTRIC ULCER, ACUTE, HEMORRHAGE (12/28/2008), Hypertension, Nephrolithiasis, NEPHROLITHIASIS, HX OF (12/28/2008), and Seizures ([place]).   He has a past surgical history that includes Hand surgery (Left); Colonoscopy with propofol (N/A, 12/14/2017); and RIGHT/LEFT HEART CATH AND CORONARY ANGIOGRAPHY (N/A, 01/12/2018).   His family history includes Cancer (age of onset: 24) in his mother; Heart failure (age of onset: 72) in his brother.He reports that he has quit smoking. His smoking use included cigarettes. He has a 52.50 pack-year smoking history. He has never used smokeless tobacco. He reports that he does not drink alcohol or use drugs.  Current Outpatient Medications on File Prior to Visit  Medication Sig Dispense Refill  . acetaminophen (TYLENOL) 650 MG CR tablet Take 1,300 mg by mouth every 8 (eight) hours as needed for pain.    [name] amLODipine (NORVASC) 5 MG tablet Take 1 tablet (5 mg total) by mouth at bedtime. 30 tablet 6  . aspirin EC 81 MG EC tablet Take 1 tablet (81 mg total) by mouth daily. 90 tablet 3  . carvedilol (COREG) 12.5 MG tablet Take 1 tablet (12.5 mg total) by mouth 2 (two) times daily. 60 tablet 6  . lisinopril (PRINIVIL,ZESTRIL) 40 MG tablet TAKE 1 TABLET BY MOUTH EVERY DAY 30 tablet 5  . phenytoin (DILANTIN) 100 MG ER capsule TAKE 8 CAPSULES BY MOUTH AT BEDTIME 240 capsule 0  . potassium chloride SA (K-DUR,KLOR-CON) 20 MEQ tablet  Take 1 tablet (20 mEq total) by mouth daily. 30 tablet 6  . pravastatin (PRAVACHOL) 80 MG tablet Take 1 tablet (80 mg total) by mouth every evening. 30 tablet 6  . spironolactone (ALDACTONE) 25 MG tablet Take 1 tablet (25 mg total) by mouth daily. 90 tablet 3  . torsemide (DEMADEX) 20 MG tablet Take 2 tablets (40 mg total) by mouth daily. 60 tablet 6  . Vitamin D, Ergocalciferol, (DRISDOL) 50000 units CAPS capsule Take 1 capsule (50,000 Units total) by mouth every 7 (seven) days. (Patient taking differently: Take 50,000 Units by mouth every Tuesday. ) 12 capsule 3  . linaclotide (LINZESS) 290 MCG CAPS capsule Take 1 capsule (290 mcg total) by mouth daily before breakfast. (Patient not taking: Reported on 07/22/2018) 30 capsule 5   No current facility-administered medications on file prior to visit.     ROS Review of Systems  Constitutional: Negative.   HENT: Negative.   Eyes: Negative for visual disturbance.  Respiratory: Negative for cough and shortness of breath.   Cardiovascular: Negative for chest pain and leg swelling.  Gastrointestinal: Negative for abdominal pain, diarrhea, nausea and vomiting.  Genitourinary: Negative for difficulty urinating.  Musculoskeletal: Negative for arthralgias and myalgias.  Skin: Negative for rash.  Neurological: Negative for headaches.  Psychiatric/Behavioral: Negative for sleep disturbance.    Objective:  BP (!) 90/56 (BP Location: Right Wrist, Patient Position: Sitting, Cuff Size: Normal)   Pulse 62   Temp (!) 97.1 F (36.2 C) (Oral)   Ht 6'  3" (1.905 m)   Wt (!) 384 lb (174.2 kg)   BMI 48.00 kg/m   BP Readings from Last 3 Encounters:  07/22/18 (!) 90/56  06/08/18 100/64  04/25/18 106/70    Wt Readings from Last 3 Encounters:  07/22/18 (!) 384 lb (174.2 kg)  06/08/18 (!) 378 lb (171.5 kg)  04/25/18 (!) 367 lb 9.6 oz (166.7 kg)     Physical Exam  Constitutional: He is oriented to person, place, and time. He appears well-developed  and well-nourished. No distress.  HENT:  Head: Normocephalic and atraumatic.  Right Ear: External ear normal.  Left Ear: External ear normal.  Nose: Nose normal.  Mouth/Throat: Oropharynx is clear and moist.  Eyes: Pupils are equal, round, and reactive to light. Conjunctivae and EOM are normal.  Neck: Normal range of motion. Neck supple.  Cardiovascular: Normal rate, regular rhythm and normal heart sounds.  No murmur heard. Pulmonary/Chest: Effort normal and breath sounds normal. No respiratory distress. He has no wheezes. He has no rales.  Abdominal: Soft. There is no tenderness.  Musculoskeletal: Normal range of motion.  Neurological: He is alert and oriented to person, place, and time. He has normal reflexes.  Skin: Skin is warm and dry.  Psychiatric: He has a normal mood and affect. His behavior is normal. Judgment and thought content normal.      Assessment & Plan:   Melvin Villarreal was seen today for hyperlipidemia, hypertension and nevus.  Diagnoses and all orders for this visit:  Seizure disorder ([place]) -     CBC with Differential/Platelet -     CMP14+EGFR  Arthropathy -     CBC with Differential/Platelet -     CMP14+EGFR  Current smoker -     CBC with Differential/Platelet -     CMP14+EGFR  Dyslipidemia -     CBC with Differential/Platelet -     CMP14+EGFR -     Lipid panel  Heavy cigarette smoker (20-39 per day) -     CBC with Differential/Platelet -     CMP14+EGFR -     VITAMIN D 25 Hydroxy (Vit-D Deficiency, Fractures)  Morbid obesity (HCC) -     CBC with Differential/Platelet -     CMP14+EGFR -     VITAMIN D 25 Hydroxy (Vit-D Deficiency, Fractures)  Essential hypertension -     CBC with Differential/Platelet -     CMP14+EGFR -     VITAMIN D 25 Hydroxy (Vit-D Deficiency, Fractures)   Allergies as of 07/22/2018   No Known Allergies     Medication List        Accurate as of 07/22/18  5:04 PM. Always use your most recent med list.            acetaminophen 650 MG CR tablet Commonly known as:  TYLENOL Take 1,300 mg by mouth every 8 (eight) hours as needed for pain.   amLODipine 5 MG tablet Commonly known as:  NORVASC Take 1 tablet (5 mg total) by mouth at bedtime.   aspirin 81 MG EC tablet Take 1 tablet (81 mg total) by mouth daily.   carvedilol 12.5 MG tablet Commonly known as:  COREG Take 1 tablet (12.5 mg total) by mouth 2 (two) times daily.   linaclotide 290 MCG Caps capsule Commonly known as:  LINZESS Take 1 capsule (290 mcg total) by mouth daily before breakfast.   lisinopril 40 MG tablet Commonly known as:  PRINIVIL,ZESTRIL TAKE 1 TABLET BY MOUTH EVERY DAY   phenytoin 100 MG  ER capsule Commonly known as:  DILANTIN TAKE 8 CAPSULES BY MOUTH AT BEDTIME   potassium chloride SA 20 MEQ tablet Commonly known as:  K-DUR,KLOR-CON Take 1 tablet (20 mEq total) by mouth daily.   pravastatin 80 MG tablet Commonly known as:  PRAVACHOL Take 1 tablet (80 mg total) by mouth every evening.   spironolactone 25 MG tablet Commonly known as:  ALDACTONE Take 1 tablet (25 mg total) by mouth daily.   torsemide 20 MG tablet Commonly known as:  DEMADEX Take 2 tablets (40 mg total) by mouth daily.   Vitamin D (Ergocalciferol) 50000 units Caps capsule Commonly known as:  DRISDOL Take 1 capsule (50,000 Units total) by mouth every 7 (seven) days.       No orders of the defined types were placed in this encounter.     Follow-up: Return in about 6 months (around 01/21/2019).  Claretta Fraise, M.D.

## 2018-07-22 NOTE — Addendum Note (Signed)
Addended by: Ilean China on: 07/22/2018 05:32 PM   Modules accepted: Orders

## 2018-07-23 LAB — CMP14+EGFR
A/G RATIO: 1.6 (ref 1.2–2.2)
ALBUMIN: 4.2 g/dL (ref 3.5–5.5)
ALK PHOS: 93 IU/L (ref 39–117)
ALT: 16 IU/L (ref 0–44)
AST: 12 IU/L (ref 0–40)
BUN / CREAT RATIO: 37 — AB (ref 9–20)
BUN: 67 mg/dL — ABNORMAL HIGH (ref 6–24)
CHLORIDE: 102 mmol/L (ref 96–106)
CO2: 21 mmol/L (ref 20–29)
Calcium: 9.2 mg/dL (ref 8.7–10.2)
Creatinine, Ser: 1.83 mg/dL — ABNORMAL HIGH (ref 0.76–1.27)
GFR calc non Af Amer: 41 mL/min/{1.73_m2} — ABNORMAL LOW (ref >59)
GFR, EST AFRICAN AMERICAN: 47 mL/min/{1.73_m2} — AB (ref >59)
Globulin, Total: 2.7 g/dL (ref 1.5–4.5)
Glucose: 106 mg/dL — ABNORMAL HIGH (ref 65–99)
POTASSIUM: 5.3 mmol/L — AB (ref 3.5–5.2)
SODIUM: 139 mmol/L (ref 134–144)
Total Protein: 6.9 g/dL (ref 6.0–8.5)

## 2018-07-23 LAB — CBC WITH DIFFERENTIAL/PLATELET
Basophils Absolute: 0 10*3/uL (ref 0.0–0.2)
Basos: 0 %
EOS (ABSOLUTE): 0.2 10*3/uL (ref 0.0–0.4)
EOS: 3 %
HEMATOCRIT: 30.9 % — AB (ref 37.5–51.0)
Hemoglobin: 10.5 g/dL — ABNORMAL LOW (ref 13.0–17.7)
IMMATURE GRANS (ABS): 0 10*3/uL (ref 0.0–0.1)
Immature Granulocytes: 0 %
LYMPHS: 22 %
Lymphocytes Absolute: 1.6 10*3/uL (ref 0.7–3.1)
MCH: 32.8 pg (ref 26.6–33.0)
MCHC: 34 g/dL (ref 31.5–35.7)
MCV: 97 fL (ref 79–97)
MONOS ABS: 0.5 10*3/uL (ref 0.1–0.9)
Monocytes: 7 %
NEUTROS PCT: 68 %
Neutrophils Absolute: 4.8 10*3/uL (ref 1.4–7.0)
PLATELETS: 224 10*3/uL (ref 150–450)
RBC: 3.2 x10E6/uL — AB (ref 4.14–5.80)
RDW: 12.8 % (ref 12.3–15.4)
WBC: 7.2 10*3/uL (ref 3.4–10.8)

## 2018-07-23 LAB — PHENYTOIN LEVEL, TOTAL: PHENYTOIN (DILANTIN), SERUM: 15.8 ug/mL (ref 10.0–20.0)

## 2018-07-23 LAB — VITAMIN D 25 HYDROXY (VIT D DEFICIENCY, FRACTURES): VIT D 25 HYDROXY: 28.7 ng/mL — AB (ref 30.0–100.0)

## 2018-07-25 ENCOUNTER — Other Ambulatory Visit: Payer: Self-pay | Admitting: Family Medicine

## 2018-07-25 ENCOUNTER — Other Ambulatory Visit: Payer: Self-pay | Admitting: *Deleted

## 2018-07-25 ENCOUNTER — Telehealth: Payer: Self-pay | Admitting: Family Medicine

## 2018-07-25 DIAGNOSIS — D649 Anemia, unspecified: Secondary | ICD-10-CM

## 2018-07-25 DIAGNOSIS — R799 Abnormal finding of blood chemistry, unspecified: Secondary | ICD-10-CM

## 2018-07-25 NOTE — Progress Notes (Signed)
Orders placed.

## 2018-07-26 NOTE — Telephone Encounter (Signed)
Aware of results per Result note

## 2018-07-28 ENCOUNTER — Other Ambulatory Visit (INDEPENDENT_AMBULATORY_CARE_PROVIDER_SITE_OTHER): Payer: 59

## 2018-07-28 ENCOUNTER — Encounter: Payer: Self-pay | Admitting: Gastroenterology

## 2018-07-28 ENCOUNTER — Ambulatory Visit (INDEPENDENT_AMBULATORY_CARE_PROVIDER_SITE_OTHER): Payer: 59 | Admitting: Gastroenterology

## 2018-07-28 VITALS — BP 100/60 | HR 84 | Ht 75.5 in | Wt 384.0 lb

## 2018-07-28 DIAGNOSIS — D649 Anemia, unspecified: Secondary | ICD-10-CM | POA: Diagnosis not present

## 2018-07-28 LAB — VITAMIN B12: VITAMIN B 12: 216 pg/mL (ref 211–911)

## 2018-07-28 LAB — IBC PANEL
Iron: 59 ug/dL (ref 42–165)
SATURATION RATIOS: 13.8 % — AB (ref 20.0–50.0)
Transferrin: 305 mg/dL (ref 212.0–360.0)

## 2018-07-28 LAB — FOLATE: Folate: 7.4 ng/mL (ref >5.9)

## 2018-07-28 LAB — FERRITIN: FERRITIN: 78 ng/mL (ref 22.0–322.0)

## 2018-07-28 NOTE — Patient Instructions (Signed)
Go to the basement for labs today and hemoccult cards   We will contact you with an appointment for [facility] for an Endoscopy with Dr Havery Moros when it becomes available   If you are age 55 or older, your body mass index should be between 23-30. Your Body mass index is 47.36 kg/m. If this is out of the aforementioned range listed, please consider follow up with your Primary Care Provider.  If you are age 82 or younger, your body mass index should be between 19-25. Your Body mass index is 47.36 kg/m. If this is out of the aformentioned range listed, please consider follow up with your Primary Care Provider.

## 2018-07-28 NOTE — Progress Notes (Signed)
07/28/2018 Melvin Villarreal [id number] 1961/05/15   HISTORY OF PRESENT ILLNESS:  This is a 57 year old male who is a patient of Dr. Doyne Keel.  He had colonoscopy earlier this year in March and was found to have several polyps that were removed, redundant colon and internal hemorrhoids.  Polyps were found to be all adenomas so repeat recommended in one year.  He is referred here today for anemia.  Hemoglobin is down about 3 g as compared to 6 months ago (13.5 grams to 10.5 grams).  There has been no overt sign of GI bleeding.  MCV is normal.  His renal function has also worsened over the last several months as well I wonder if his hemoglobin is just paralleling that as anemia of chronic disease.  He was placed on torsemide several months ago as well so I wonder if this is contributing to his renal function issues.  Anyway, he denies NSAID use.     Past Medical History:  Diagnosis Date  . Current smoker 09/28/2017  . GASTRIC ULCER, ACUTE, HEMORRHAGE 12/28/2008   Qualifier: Diagnosis of  By: Deatra Ina MD, Sandy Salaam   . Hypertension   . Nephrolithiasis   . NEPHROLITHIASIS, HX OF 12/28/2008   Qualifier: Diagnosis of  By: Marland Mcalpine    . Seizures ([place])    Past Surgical History:  Procedure Laterality Date  . COLONOSCOPY WITH PROPOFOL N/A 12/14/2017   Procedure: COLONOSCOPY WITH PROPOFOL;  Surgeon: Yetta Flock, MD;  [facility];  Service: Gastroenterology;  Laterality: N/A;  . HAND SURGERY Left    Collapsed vein  . RIGHT/LEFT HEART CATH AND CORONARY ANGIOGRAPHY N/A 01/12/2018   Procedure: RIGHT/LEFT HEART CATH AND CORONARY ANGIOGRAPHY;  Surgeon: Sherren Mocha, MD;  Location: [facility];  Service: Cardiovascular;  Laterality: N/A;    reports that he has quit smoking. His smoking use included cigarettes. He has a 52.50 pack-year smoking history. He has never used smokeless tobacco. He reports that he does not drink alcohol or use drugs. family history  includes Cancer (age of onset: 84) in his mother; Heart failure (age of onset: 38) in his brother. Allergies  Allergen Reactions  . Other     Mayonase, makes him break out in hives       Outpatient Encounter Medications as of 07/28/2018  Medication Sig  . acetaminophen (TYLENOL) 650 MG CR tablet Take 1,300 mg by mouth every 8 (eight) hours as needed for pain.  [name] amLODipine (NORVASC) 5 MG tablet Take 1 tablet (5 mg total) by mouth at bedtime.  [name] aspirin EC 81 MG EC tablet Take 1 tablet (81 mg total) by mouth daily.  . carvedilol (COREG) 12.5 MG tablet Take 1 tablet (12.5 mg total) by mouth 2 (two) times daily.  [name] lisinopril (PRINIVIL,ZESTRIL) 40 MG tablet TAKE 1 TABLET BY MOUTH EVERY DAY  . phenytoin (DILANTIN) 100 MG ER capsule TAKE 8 CAPSULES BY MOUTH AT BEDTIME  . potassium chloride SA (K-DUR,KLOR-CON) 20 MEQ tablet Take 1 tablet (20 mEq total) by mouth daily.  [name] spironolactone (ALDACTONE) 25 MG tablet Take 1 tablet (25 mg total) by mouth daily.  [name] torsemide (DEMADEX) 20 MG tablet Take 2 tablets (40 mg total) by mouth daily.  . Vitamin D, Ergocalciferol, (DRISDOL) 50000 units CAPS capsule Take 1 capsule (50,000 Units total) by mouth every 7 (seven) days. (Patient taking differently: Take 50,000 Units by mouth every Tuesday. )  . [DISCONTINUED] linaclotide (LINZESS) [place]  capsule Take 1 capsule (290 mcg total) by mouth daily before breakfast. (Patient not taking: Reported on 07/22/2018)   No facility-administered encounter medications on file as of 07/28/2018.      REVIEW OF SYSTEMS  : All other systems reviewed and negative except where noted in the History of Present Illness.   PHYSICAL EXAM: There were no vitals taken for this visit. General: Well developed white male in no acute distress Head: Normocephalic and atraumatic Eyes:  Sclerae anicteric, conjunctiva pink. Ears: Normal auditory acuity Lungs: Clear throughout to auscultation; no increased WOB. Heart: Regular  rate and rhythm; no M/R/G. Abdomen: Soft, non-distended.  BS present.  Non-tender. Musculoskeletal: Symmetrical with no gross deformities  Skin: No lesions on visible extremities Extremities: No edema  Neurological: Alert oriented x 4, grossly non-focal Psychological:  Alert and cooperative. Normal mood and affect  ASSESSMENT AND PLAN: *Normocytic anemia: Hemoglobin is down about 3 g as compared to 6 months ago.  There has been no overt sign of GI bleeding.  MCV is normal.  His renal function has also worsened over the last several months as well I wonder if his hemoglobin is just paralleling that as anemia of chronic disease.  He was placed on torsemide several months ago as well so I wonder if this is contributing to his renal function issues.  It appears that he is being referred to nephrology.  I will schedule him for EGD to rule out upper GI source of blood loss.  This will to be performed with Dr. Havery Moros at [facility] due to his weight.  I will message him to see what type of availability we have to do this.  I am also going to check Hemoccult cards x3, iron studies, B12 levels, and folate levels.  **The risks, benefits, and alternatives to EGD were discussed with the patient and he consents to proceed.    CC:  Claretta Fraise, MD

## 2018-07-28 NOTE — Progress Notes (Signed)
Agree with assessment and plan as outlined. Agree with labs to assess for iron deficiency, B12, etc, as well as stool testing for occult blood. Melvin Villarreal can you let me know when all of that is complete and will determine if we need to pursue endoscopy. MCV in the 90s without any obvious blood loss or iron deficiency makes it less likely to be GI tract in etiology.

## 2018-08-04 ENCOUNTER — Other Ambulatory Visit (INDEPENDENT_AMBULATORY_CARE_PROVIDER_SITE_OTHER): Payer: 59

## 2018-08-04 DIAGNOSIS — D649 Anemia, unspecified: Secondary | ICD-10-CM

## 2018-08-04 LAB — HEMOCCULT SLIDES (X 3 CARDS)
Fecal Occult Blood: NEGATIVE
OCCULT 1: NEGATIVE
OCCULT 2: NEGATIVE
OCCULT 3: NEGATIVE
OCCULT 4: NEGATIVE
OCCULT 5: NEGATIVE

## 2018-08-28 NOTE — Progress Notes (Signed)
Cardiology Office Note   Date:  08/31/2018   ID:  Villarreal Melvin, DOB 08-11-1961, MRN [id number]  PCP:  Melvin Fraise, MD  Cardiologist:   Minus Breeding, MD Referring:  Melvin Fraise, MD    Chief Complaint  Patient presents with  . Leg Swelling      History of Present Illness: Melvin Villarreal is a 57 y.o. male who is referred by Melvin Fraise, MD for evaluation of systolic and diastolic HF.   At the last visit with me I reduced his Norvasc and increased his beta blocker.  This was titrated again when he saw Rosaria Ferries, PA.  He has his Norvasc stopped.    Unfortunately he has continued to gain weight.  He thinks he does not eat that much but I do think he probably has poor choices.  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  He drinks a lot of sugar sodas he says.  I think he drinks more than 48 ounces.  Unfortunately with his torsemide he has now increased BUN and creatinine.  However, he is continued to gain weight and some of that is fluid.  He has more swelling in his legs than he did when he was in the hospital.  He is not having any new shortness of breath, PND or orthopnea.  He is not having any new chest pressure, neck or arm discomfort.   Past Medical History:  Diagnosis Date  . GASTRIC ULCER, ACUTE, HEMORRHAGE 12/28/2008   Qualifier: Diagnosis of  By: Melvin Ina MD, Melvin Villarreal   . Hypertension   . Nephrolithiasis   . NEPHROLITHIASIS, HX OF 12/28/2008   Qualifier: Diagnosis of  By: Marland Mcalpine    . Seizures ([place])     Past Surgical History:  Procedure Laterality Date  . COLONOSCOPY WITH PROPOFOL N/A 12/14/2017   Procedure: COLONOSCOPY WITH PROPOFOL;  Surgeon: Melvin Flock, MD;  Location: [facility];  Service: Gastroenterology;  Laterality: N/A;  . HAND SURGERY Left    Collapsed vein  . RIGHT/LEFT HEART CATH AND CORONARY ANGIOGRAPHY N/A 01/12/2018   Procedure: RIGHT/LEFT  HEART CATH AND CORONARY ANGIOGRAPHY;  Surgeon: Melvin Mocha, MD;  Location: [facility];  Service: Cardiovascular;  Laterality: N/A;     Current Outpatient Medications  Medication Sig Dispense Refill  . acetaminophen (TYLENOL) 650 MG CR tablet Take 1,300 mg by mouth every 8 (eight) hours as needed for pain.    [name] aspirin EC 81 MG EC tablet Take 1 tablet (81 mg total) by mouth daily. 90 tablet 3  . lisinopril (PRINIVIL,ZESTRIL) 40 MG tablet TAKE 1 TABLET BY MOUTH EVERY DAY 30 tablet 5  . phenytoin (DILANTIN) 100 MG ER capsule TAKE 8 CAPSULES BY MOUTH AT BEDTIME 240 capsule 5  . torsemide (DEMADEX) 20 MG tablet Take 2 tablets (40 mg total) by mouth daily. 60 tablet 6  . Vitamin D, Ergocalciferol, (DRISDOL) 50000 units CAPS capsule Take 1 capsule (50,000 Units total) by mouth every 7 (seven) days. (Patient taking differently: Take 50,000 Units by mouth every Tuesday. ) 12 capsule 3  . carvedilol (COREG) 12.5 MG tablet Take 1.5 tablets (18.75 mg total) by mouth 2 (two) times daily. 270 tablet 3   No current facility-administered medications for this visit.     Allergies:   Other    ROS:  As stated in the HPI and negative for all other systems.  PHYSICAL EXAM: BP 115/63   Pulse 77   Ht 6' 3.5" (1.918 m)   Wt (!) 392 lb (177.8 kg)   SpO2 94%   BMI 48.35 kg/m  GENERAL:  Well appearing NECK:  No jugular venous distention, waveform within normal limits, carotid upstroke brisk and symmetric, no bruits, no thyromegaly LUNGS:  Clear to auscultation bilaterally CHEST:  Unremarkable HEART:  PMI not displaced or sustained,S1 and S2 within normal limits, no S3, no S4, no clicks, no rubs, no murmurs ABD:  Flat, positive bowel sounds normal in frequency in pitch, no bruits, no rebound, no guarding, no midline pulsatile mass, no hepatomegaly, no splenomegaly EXT:  2 plus pulses throughout, moderate edema, no cyanosis no clubbing, chronic venous stasis changes    EKG:  EKG is not   ordered today.  Cath  Right and left heart cath 01/12/18:  Prox RCA lesion is 95% stenosed.  Mid RCA lesion is 95% stenosed.  Dist RCA lesion is 80% stenosed.  Mid LM to Dist LM lesion is 40% stenosed.  Ost LAD to Prox LAD lesion is 50% stenosed.  Prox LAD to Mid LAD lesion is 50% stenosed.  Prox Cx to Mid Cx lesion is 70% stenosed.  Ost 1st Mrg to 1st Mrg lesion is 30% stenosed.  The left ventricular ejection fraction is 45-50% by visual estimate.  LV end diastolic pressure is normal.  Hemodynamic findings consistent with moderate pulmonary hypertension.  LV end diastolic pressure is normal.    Recent Labs: 01/07/2018: B Natriuretic Peptide 230.4; TSH 1.599 07/22/2018: ALT 16; BUN 67; Creatinine, Ser 1.83; Hemoglobin 10.5; Platelets 224; Potassium 5.3; Sodium 139    Lipid Panel    Component Value Date/Time   CHOL 186 11/01/2017 0928   TRIG 64 11/01/2017 0928   HDL 46 11/01/2017 0928   CHOLHDL 4.0 11/01/2017 0928   LDLCALC 127 (H) 11/01/2017 0928      Wt Readings from Last 3 Encounters:  08/31/18 (!) 392 lb (177.8 kg)  07/28/18 (!) 384 lb (174.2 kg)  07/22/18 (!) 384 lb (174.2 kg)      Other studies Reviewed: Additional studies/ records that were reviewed today include:  Labs Review of the above records demonstrates:  See elsewhere    ASSESSMENT AND PLAN:  CHRONIC SYSTOLIC AND DIASTOLIC HF:    I will continue to titrate meds.  Today him to stop his spironolactone because his potassium and his creatinine are elevated.  I do not think he can tolerate this medication right now.  I am getting increase his carvedilol to 18.  75 mg twice daily.  We talked a great deal again today about salt and fluid restriction.  Much of his difficulty is related to his morbid obesity and lifestyle.  CAD:   He has no ongoing chest pain.  No further cardiovascular testing is suggested.  We will continue with risk reduction.   SNORING: He has refused a sleep study.  I have  tried to reschedule this.  I think he is finally cannot comply with in order to have this done per his nephrologist.  DYSLIPIDEMIA:     He was taken off of Lipitor.  He agree at the last meeting to have Pravachol 80 mg daily.  I will order a lipid profile.   HTN:   This is being managed in the context of treating his CHF   MORBID OBESITY:   We had another long discussion about this.  I think this is the most important issue  facing him.   TOBACCO ABUSE: He quit smoking in March.  Current medicines are reviewed at length with the patient today.  The patient does not have concerns regarding medicines.  The following changes have been made:  As above  Labs/ tests ordered today include:    Labs No orders of the defined types were placed in this encounter.    Disposition:   FU with me in 3 months.    Signed, Minus Breeding, MD  08/31/2018 10:01 AM    [facility]

## 2018-08-31 ENCOUNTER — Ambulatory Visit (INDEPENDENT_AMBULATORY_CARE_PROVIDER_SITE_OTHER): Payer: 59 | Admitting: Cardiology

## 2018-08-31 ENCOUNTER — Encounter: Payer: Self-pay | Admitting: Cardiology

## 2018-08-31 VITALS — BP 115/63 | HR 77 | Ht 75.5 in | Wt 392.0 lb

## 2018-08-31 DIAGNOSIS — E785 Hyperlipidemia, unspecified: Secondary | ICD-10-CM | POA: Diagnosis not present

## 2018-08-31 DIAGNOSIS — M7989 Other specified soft tissue disorders: Secondary | ICD-10-CM | POA: Diagnosis not present

## 2018-08-31 DIAGNOSIS — I5023 Acute on chronic systolic (congestive) heart failure: Secondary | ICD-10-CM | POA: Diagnosis not present

## 2018-08-31 MED ORDER — CARVEDILOL 12.5 MG PO TABS: 18.7500 mg | ORAL_TABLET | Freq: Two times a day (BID) | ORAL | 3 refills | Status: AC

## 2018-08-31 NOTE — Patient Instructions (Addendum)
Medication Instructions:  Please increase your Carvedilol to 18.75 mg twice a day.  This will be 1 and 1/2 tablets twice a day. Stop your Spironolactone. Continue all other medications as listed.  If you need a refill on your cardiac medications before your next appointment, please call your pharmacy.   Please bring or fax a copy of your most recent lab work including Lipid profile.  [phone]  Follow-Up: . Follow up in 3 months with Dr Percival Spanish in [place].  Thank you for choosing [facility]!!

## 2018-09-05 ENCOUNTER — Other Ambulatory Visit (HOSPITAL_COMMUNITY): Payer: Self-pay | Admitting: Nephrology

## 2018-09-05 ENCOUNTER — Other Ambulatory Visit (HOSPITAL_COMMUNITY): Payer: Self-pay | Admitting: Family Medicine

## 2018-09-05 DIAGNOSIS — N183 Chronic kidney disease, stage 3 unspecified: Secondary | ICD-10-CM

## 2018-09-19 ENCOUNTER — Inpatient Hospital Stay (HOSPITAL_COMMUNITY): Payer: 59 | Admitting: Anesthesiology

## 2018-09-19 ENCOUNTER — Inpatient Hospital Stay (HOSPITAL_COMMUNITY): Payer: 59

## 2018-09-19 ENCOUNTER — Inpatient Hospital Stay (HOSPITAL_COMMUNITY)
Admission: EM | Admit: 2018-09-19 | Discharge: 2018-10-12 | DRG: 286 | Disposition: E | Payer: 59 | Attending: Pulmonary Disease | Admitting: Pulmonary Disease

## 2018-09-19 ENCOUNTER — Other Ambulatory Visit: Payer: Self-pay

## 2018-09-19 ENCOUNTER — Inpatient Hospital Stay (HOSPITAL_COMMUNITY): Admission: EM | Disposition: E | Payer: Self-pay | Source: Home / Self Care | Attending: Pulmonary Disease

## 2018-09-19 ENCOUNTER — Encounter (HOSPITAL_COMMUNITY): Payer: Self-pay | Admitting: Emergency Medicine

## 2018-09-19 ENCOUNTER — Emergency Department (HOSPITAL_COMMUNITY): Payer: 59

## 2018-09-19 ENCOUNTER — Ambulatory Visit (HOSPITAL_COMMUNITY)
Admission: RE | Admit: 2018-09-19 | Discharge: 2018-09-19 | Disposition: A | Discharge status: Home / Self Care | Payer: 59 | Source: Ambulatory Visit | Attending: Family Medicine | Admitting: Family Medicine

## 2018-09-19 ENCOUNTER — Encounter (HOSPITAL_COMMUNITY): Payer: Self-pay

## 2018-09-19 DIAGNOSIS — N179 Acute kidney failure, unspecified: Secondary | ICD-10-CM | POA: Diagnosis not present

## 2018-09-19 DIAGNOSIS — Z8249 Family history of ischemic heart disease and other diseases of the circulatory system: Secondary | ICD-10-CM

## 2018-09-19 DIAGNOSIS — Z7982 Long term (current) use of aspirin: Secondary | ICD-10-CM | POA: Diagnosis not present

## 2018-09-19 DIAGNOSIS — I2510 Atherosclerotic heart disease of native coronary artery without angina pectoris: Secondary | ICD-10-CM | POA: Diagnosis present

## 2018-09-19 DIAGNOSIS — I4891 Unspecified atrial fibrillation: Secondary | ICD-10-CM | POA: Diagnosis not present

## 2018-09-19 DIAGNOSIS — I2729 Other secondary pulmonary hypertension: Secondary | ICD-10-CM | POA: Diagnosis not present

## 2018-09-19 DIAGNOSIS — I25118 Atherosclerotic heart disease of native coronary artery with other forms of angina pectoris: Secondary | ICD-10-CM | POA: Diagnosis not present

## 2018-09-19 DIAGNOSIS — E785 Hyperlipidemia, unspecified: Secondary | ICD-10-CM | POA: Diagnosis present

## 2018-09-19 DIAGNOSIS — I469 Cardiac arrest, cause unspecified: Secondary | ICD-10-CM

## 2018-09-19 DIAGNOSIS — Z7189 Other specified counseling: Secondary | ICD-10-CM | POA: Diagnosis not present

## 2018-09-19 DIAGNOSIS — N183 Chronic kidney disease, stage 3 unspecified: Secondary | ICD-10-CM

## 2018-09-19 DIAGNOSIS — R739 Hyperglycemia, unspecified: Secondary | ICD-10-CM | POA: Diagnosis present

## 2018-09-19 DIAGNOSIS — Z6841 Body Mass Index (BMI) 40.0 and over, adult: Secondary | ICD-10-CM

## 2018-09-19 DIAGNOSIS — I462 Cardiac arrest due to underlying cardiac condition: Secondary | ICD-10-CM | POA: Diagnosis present

## 2018-09-19 DIAGNOSIS — G931 Anoxic brain damage, not elsewhere classified: Secondary | ICD-10-CM | POA: Diagnosis present

## 2018-09-19 DIAGNOSIS — Z87442 Personal history of urinary calculi: Secondary | ICD-10-CM | POA: Diagnosis not present

## 2018-09-19 DIAGNOSIS — I110 Hypertensive heart disease with heart failure: Secondary | ICD-10-CM | POA: Diagnosis present

## 2018-09-19 DIAGNOSIS — I5082 Biventricular heart failure: Secondary | ICD-10-CM | POA: Diagnosis present

## 2018-09-19 DIAGNOSIS — R402212 Coma scale, best verbal response, none, at arrival to emergency department: Secondary | ICD-10-CM | POA: Diagnosis not present

## 2018-09-19 DIAGNOSIS — G40909 Epilepsy, unspecified, not intractable, without status epilepticus: Secondary | ICD-10-CM | POA: Diagnosis present

## 2018-09-19 DIAGNOSIS — J9601 Acute respiratory failure with hypoxia: Secondary | ICD-10-CM

## 2018-09-19 DIAGNOSIS — Z87891 Personal history of nicotine dependence: Secondary | ICD-10-CM | POA: Diagnosis not present

## 2018-09-19 DIAGNOSIS — Z515 Encounter for palliative care: Secondary | ICD-10-CM | POA: Diagnosis not present

## 2018-09-19 DIAGNOSIS — Z9289 Personal history of other medical treatment: Secondary | ICD-10-CM

## 2018-09-19 DIAGNOSIS — R402312 Coma scale, best motor response, none, at arrival to emergency department: Secondary | ICD-10-CM | POA: Diagnosis present

## 2018-09-19 DIAGNOSIS — I4510 Unspecified right bundle-branch block: Secondary | ICD-10-CM | POA: Diagnosis present

## 2018-09-19 DIAGNOSIS — Z66 Do not resuscitate: Secondary | ICD-10-CM | POA: Diagnosis not present

## 2018-09-19 DIAGNOSIS — I361 Nonrheumatic tricuspid (valve) insufficiency: Secondary | ICD-10-CM | POA: Diagnosis not present

## 2018-09-19 DIAGNOSIS — I2582 Chronic total occlusion of coronary artery: Secondary | ICD-10-CM | POA: Diagnosis present

## 2018-09-19 DIAGNOSIS — D539 Nutritional anemia, unspecified: Secondary | ICD-10-CM | POA: Diagnosis present

## 2018-09-19 DIAGNOSIS — G253 Myoclonus: Secondary | ICD-10-CM | POA: Diagnosis not present

## 2018-09-19 DIAGNOSIS — Z809 Family history of malignant neoplasm, unspecified: Secondary | ICD-10-CM | POA: Diagnosis not present

## 2018-09-19 DIAGNOSIS — I5042 Chronic combined systolic (congestive) and diastolic (congestive) heart failure: Secondary | ICD-10-CM | POA: Diagnosis not present

## 2018-09-19 DIAGNOSIS — Z8711 Personal history of peptic ulcer disease: Secondary | ICD-10-CM

## 2018-09-19 DIAGNOSIS — J9600 Acute respiratory failure, unspecified whether with hypoxia or hypercapnia: Secondary | ICD-10-CM

## 2018-09-19 DIAGNOSIS — I10 Essential (primary) hypertension: Secondary | ICD-10-CM | POA: Diagnosis present

## 2018-09-19 DIAGNOSIS — I4901 Ventricular fibrillation: Secondary | ICD-10-CM | POA: Diagnosis present

## 2018-09-19 DIAGNOSIS — Z978 Presence of other specified devices: Secondary | ICD-10-CM

## 2018-09-19 DIAGNOSIS — R402112 Coma scale, eyes open, never, at arrival to emergency department: Secondary | ICD-10-CM | POA: Diagnosis present

## 2018-09-19 DIAGNOSIS — Z0189 Encounter for other specified special examinations: Secondary | ICD-10-CM

## 2018-09-19 DIAGNOSIS — D649 Anemia, unspecified: Secondary | ICD-10-CM | POA: Diagnosis present

## 2018-09-19 DIAGNOSIS — Z91018 Allergy to other foods: Secondary | ICD-10-CM

## 2018-09-19 HISTORY — PX: CENTRAL LINE INSERTION: CATH118232

## 2018-09-19 HISTORY — PX: LEFT HEART CATH AND CORONARY ANGIOGRAPHY: CATH118249

## 2018-09-19 HISTORY — PX: ULTRASOUND GUIDANCE FOR VASCULAR ACCESS: SHX6516

## 2018-09-19 LAB — URINALYSIS, MICROSCOPIC (REFLEX)

## 2018-09-19 LAB — BLOOD GAS, ARTERIAL
Acid-base deficit: 6.9 mmol/L — ABNORMAL HIGH (ref 0.0–2.0)
Bicarbonate: 18.3 mmol/L — ABNORMAL LOW (ref 20.0–28.0)
Drawn by: 22223
FIO2: 100
O2 Saturation: 99.6 %
PEEP: 5 cmH2O
RATE: 24 resp/min
VT: 680 mL
pCO2 arterial: 48.7 mmHg — ABNORMAL HIGH (ref 32.0–48.0)
pH, Arterial: 7.226 — ABNORMAL LOW (ref 7.350–7.450)
pO2, Arterial: 395 mmHg — ABNORMAL HIGH (ref 83.0–108.0)

## 2018-09-19 LAB — COMPREHENSIVE METABOLIC PANEL
ALT: 26 U/L (ref 0–44)
AST: 36 U/L (ref 15–41)
Albumin: 3.4 g/dL — ABNORMAL LOW (ref 3.5–5.0)
Alkaline Phosphatase: 110 U/L (ref 38–126)
Anion gap: 17 — ABNORMAL HIGH (ref 5–15)
BILIRUBIN TOTAL: 0.7 mg/dL (ref 0.3–1.2)
BUN: 20 mg/dL (ref 6–20)
CO2: 19 mmol/L — ABNORMAL LOW (ref 22–32)
CREATININE: 1.25 mg/dL — AB (ref 0.61–1.24)
Calcium: 8.4 mg/dL — ABNORMAL LOW (ref 8.9–10.3)
Chloride: 101 mmol/L (ref 98–111)
GFR calc Af Amer: >60 mL/min (ref >60)
GFR calc non Af Amer: >60 mL/min (ref >60)
Glucose, Bld: 341 mg/dL — ABNORMAL HIGH (ref 70–99)
Potassium: 3.6 mmol/L (ref 3.5–5.1)
Sodium: 137 mmol/L (ref 135–145)
Total Protein: 7.4 g/dL (ref 6.5–8.1)

## 2018-09-19 LAB — PHENYTOIN LEVEL, TOTAL: Phenytoin Lvl: 8.8 ug/mL — ABNORMAL LOW (ref 10.0–20.0)

## 2018-09-19 LAB — POCT I-STAT 3, ART BLOOD GAS (G3+)
Acid-Base Excess: 2 mmol/L (ref 0.0–2.0)
Acid-Base Excess: 5 mmol/L — ABNORMAL HIGH (ref 0.0–2.0)
BICARBONATE: 30.5 mmol/L — AB (ref 20.0–28.0)
Bicarbonate: 30.7 mmol/L — ABNORMAL HIGH (ref 20.0–28.0)
O2 Saturation: 100 %
O2 Saturation: 100 %
PCO2 ART: 66.1 mmHg — AB (ref 32.0–48.0)
Patient temperature: 98.6
TCO2: 32 mmol/L (ref 22–32)
TCO2: 32 mmol/L (ref 22–32)
pCO2 arterial: 51 mmHg — ABNORMAL HIGH (ref 32.0–48.0)
pH, Arterial: 7.272 — ABNORMAL LOW (ref 7.350–7.450)
pH, Arterial: 7.388 (ref 7.350–7.450)
pO2, Arterial: 261 mmHg — ABNORMAL HIGH (ref 83.0–108.0)
pO2, Arterial: 374 mmHg — ABNORMAL HIGH (ref 83.0–108.0)

## 2018-09-19 LAB — URINALYSIS, ROUTINE W REFLEX MICROSCOPIC
Bilirubin Urine: NEGATIVE
Glucose, UA: 100 mg/dL — AB
Ketones, ur: NEGATIVE mg/dL
LEUKOCYTES UA: NEGATIVE
NITRITE: NEGATIVE
Protein, ur: >300 mg/dL — AB
Specific Gravity, Urine: 1.025 (ref 1.005–1.030)
pH: 5.5 (ref 5.0–8.0)

## 2018-09-19 LAB — CBC WITH DIFFERENTIAL/PLATELET
Abs Immature Granulocytes: 1.14 10*3/uL — ABNORMAL HIGH (ref 0.00–0.07)
Basophils Absolute: 0.1 10*3/uL (ref 0.0–0.1)
Basophils Relative: 1 %
EOS ABS: 0.1 10*3/uL (ref 0.0–0.5)
Eosinophils Relative: 1 %
HEMATOCRIT: 39.6 % (ref 39.0–52.0)
Hemoglobin: 11.3 g/dL — ABNORMAL LOW (ref 13.0–17.0)
Immature Granulocytes: 8 %
Lymphocytes Relative: 31 %
Lymphs Abs: 4.5 10*3/uL — ABNORMAL HIGH (ref 0.7–4.0)
MCH: 30.8 pg (ref 26.0–34.0)
MCHC: 28.5 g/dL — ABNORMAL LOW (ref 30.0–36.0)
MCV: 107.9 fL — AB (ref 80.0–100.0)
MONOS PCT: 5 %
Monocytes Absolute: 0.7 10*3/uL (ref 0.1–1.0)
Neutro Abs: 8.3 10*3/uL — ABNORMAL HIGH (ref 1.7–7.7)
Neutrophils Relative %: 54 %
Platelets: 237 10*3/uL (ref 150–400)
RBC: 3.67 MIL/uL — ABNORMAL LOW (ref 4.22–5.81)
RDW: 12.5 % (ref 11.5–15.5)
WBC: 14.8 10*3/uL — ABNORMAL HIGH (ref 4.0–10.5)
nRBC: 0.3 % — ABNORMAL HIGH (ref 0.0–0.2)

## 2018-09-19 LAB — HEMOGLOBIN A1C
Hgb A1c MFr Bld: 5.6 % (ref 4.8–5.6)
MEAN PLASMA GLUCOSE: 114.02 mg/dL

## 2018-09-19 LAB — I-STAT CHEM 8, ED
BUN: 23 mg/dL — ABNORMAL HIGH (ref 6–20)
Calcium, Ion: 0.74 mmol/L — CL (ref 1.15–1.40)
Chloride: 111 mmol/L (ref 98–111)
Creatinine, Ser: 0.8 mg/dL (ref 0.61–1.24)
Glucose, Bld: 252 mg/dL — ABNORMAL HIGH (ref 70–99)
HEMATOCRIT: 28 % — AB (ref 39.0–52.0)
Hemoglobin: 9.5 g/dL — ABNORMAL LOW (ref 13.0–17.0)
Potassium: 5.3 mmol/L — ABNORMAL HIGH (ref 3.5–5.1)
SODIUM: 137 mmol/L (ref 135–145)
TCO2: 19 mmol/L — ABNORMAL LOW (ref 22–32)

## 2018-09-19 LAB — BRAIN NATRIURETIC PEPTIDE: B Natriuretic Peptide: 192.9 pg/mL — ABNORMAL HIGH (ref 0.0–100.0)

## 2018-09-19 LAB — MAGNESIUM: Magnesium: 2 mg/dL (ref 1.7–2.4)

## 2018-09-19 LAB — GLUCOSE, CAPILLARY
GLUCOSE-CAPILLARY: 152 mg/dL — AB (ref 70–99)
Glucose-Capillary: 128 mg/dL — ABNORMAL HIGH (ref 70–99)

## 2018-09-19 LAB — CBG MONITORING, ED: GLUCOSE-CAPILLARY: 178 mg/dL — AB (ref 70–99)

## 2018-09-19 LAB — I-STAT CG4 LACTIC ACID, ED: LACTIC ACID, VENOUS: 7.87 mmol/L — AB (ref 0.5–1.9)

## 2018-09-19 LAB — I-STAT TROPONIN, ED: Troponin i, poc: 0.06 ng/mL (ref 0.00–0.08)

## 2018-09-19 LAB — TRIGLYCERIDES: TRIGLYCERIDES: 110 mg/dL (ref <150)

## 2018-09-19 LAB — PHOSPHORUS: Phosphorus: 4.9 mg/dL — ABNORMAL HIGH (ref 2.5–4.6)

## 2018-09-19 SURGERY — LEFT HEART CATH AND CORONARY ANGIOGRAPHY
Anesthesia: LOCAL

## 2018-09-19 MED ORDER — ONDANSETRON HCL 4 MG/2ML IJ SOLN: 4.0000 mg | Freq: Four times a day (QID) | INTRAMUSCULAR | Status: DC | PRN

## 2018-09-19 MED ORDER — AMIODARONE HCL IN DEXTROSE 360-4.14 MG/200ML-% IV SOLN
30.0000 mg/h | INTRAVENOUS | Status: DC
  Administered 2018-09-19 – 2018-09-21 (×5): 30 mg/h via INTRAVENOUS
  Filled 2018-09-19 (×4): qty 200

## 2018-09-19 MED ORDER — HEPARIN (PORCINE) IN NACL 1000-0.9 UT/500ML-% IV SOLN
INTRAVENOUS | Status: AC
  Filled 2018-09-19: qty 1000

## 2018-09-19 MED ORDER — ACETAMINOPHEN 325 MG PO TABS: 650.0000 mg | ORAL_TABLET | ORAL | Status: DC | PRN

## 2018-09-19 MED ORDER — SODIUM CHLORIDE 0.9 % IV SOLN
3.0000 g | Freq: Three times a day (TID) | INTRAVENOUS | Status: DC
  Administered 2018-09-19 – 2018-09-20 (×4): 3 g via INTRAVENOUS
  Filled 2018-09-19 (×5): qty 3

## 2018-09-19 MED ORDER — SODIUM CHLORIDE 0.9% FLUSH
3.0000 mL | Freq: Two times a day (BID) | INTRAVENOUS | Status: DC
  Administered 2018-09-19 – 2018-09-20 (×3): 3 mL via INTRAVENOUS

## 2018-09-19 MED ORDER — SODIUM CHLORIDE 0.9 % IV SOLN
0.5000 mg/h | INTRAVENOUS | Status: DC
  Administered 2018-09-19: 5 mg/h via INTRAVENOUS
  Filled 2018-09-19: qty 10

## 2018-09-19 MED ORDER — ROCURONIUM BROMIDE 50 MG/5ML IV SOLN
INTRAVENOUS | Status: DC | PRN
  Administered 2018-09-19: 50 mg via INTRAVENOUS

## 2018-09-19 MED ORDER — SODIUM CHLORIDE 0.9 % IV SOLN
INTRAVENOUS | Status: DC | PRN
  Administered 2018-09-19: 10 mL/h via INTRAVENOUS

## 2018-09-19 MED ORDER — ASPIRIN EC 81 MG PO TBEC
81.0000 mg | DELAYED_RELEASE_TABLET | Freq: Every day | ORAL | Status: DC
  Filled 2018-09-19: qty 1

## 2018-09-19 MED ORDER — SODIUM BICARBONATE 8.4 % IV SOLN
50.0000 meq | Freq: Once | INTRAVENOUS | Status: AC
  Administered 2018-09-19: 50 meq via INTRAVENOUS

## 2018-09-19 MED ORDER — FENTANYL 2500MCG IN NS 250ML (10MCG/ML) PREMIX INFUSION
0.0000 ug/h | INTRAVENOUS | Status: DC
  Administered 2018-09-19: 25 ug/h via INTRAVENOUS
  Filled 2018-09-19: qty 250

## 2018-09-19 MED ORDER — LEVETIRACETAM IN NACL 1500 MG/100ML IV SOLN
1500.0000 mg | INTRAVENOUS | Status: AC
  Administered 2018-09-19: 1500 mg via INTRAVENOUS
  Filled 2018-09-19: qty 100

## 2018-09-19 MED ORDER — THIAMINE HCL 100 MG/ML IJ SOLN
100.0000 mg | Freq: Every day | INTRAMUSCULAR | Status: DC
  Administered 2018-09-19 – 2018-09-20 (×2): 100 mg via INTRAVENOUS
  Filled 2018-09-19 (×2): qty 2

## 2018-09-19 MED ORDER — PHENYTOIN 50 MG PO CHEW
400.0000 mg | CHEWABLE_TABLET | Freq: Two times a day (BID) | ORAL | Status: DC
  Administered 2018-09-20 – 2018-09-21 (×3): 400 mg
  Filled 2018-09-19 (×5): qty 8

## 2018-09-19 MED ORDER — AMIODARONE HCL IN DEXTROSE 360-4.14 MG/200ML-% IV SOLN: 60.0000 mg/h | INTRAVENOUS | Status: AC

## 2018-09-19 MED ORDER — HEPARIN SODIUM (PORCINE) 1000 UNIT/ML IJ SOLN
INTRAMUSCULAR | Status: DC | PRN
  Administered 2018-09-19: 6000 [IU] via INTRAVENOUS

## 2018-09-19 MED ORDER — SODIUM CHLORIDE 0.9% FLUSH: 3.0000 mL | INTRAVENOUS | Status: DC | PRN

## 2018-09-19 MED ORDER — ORAL CARE MOUTH RINSE
15.0000 mL | OROMUCOSAL | Status: DC
  Administered 2018-09-19 – 2018-09-21 (×18): 15 mL via OROMUCOSAL

## 2018-09-19 MED ORDER — FENTANYL CITRATE (PF) 100 MCG/2ML IJ SOLN
100.0000 ug | INTRAMUSCULAR | Status: DC | PRN
  Administered 2018-09-19 – 2018-09-20 (×2): 100 ug via INTRAVENOUS
  Filled 2018-09-19: qty 2

## 2018-09-19 MED ORDER — IOHEXOL 350 MG/ML SOLN
INTRAVENOUS | Status: DC | PRN
  Administered 2018-09-19: 85 mL via INTRA_ARTERIAL

## 2018-09-19 MED ORDER — EPINEPHRINE PF 1 MG/10ML IJ SOSY
PREFILLED_SYRINGE | INTRAMUSCULAR | Status: AC | PRN
  Administered 2018-09-19: 1 mg via INTRAVENOUS

## 2018-09-19 MED ORDER — LIDOCAINE HCL (PF) 1 % IJ SOLN
INTRAMUSCULAR | Status: DC | PRN
  Administered 2018-09-19: 2 mL

## 2018-09-19 MED ORDER — NITROGLYCERIN 1 MG/10 ML FOR IR/CATH LAB
INTRA_ARTERIAL | Status: AC
  Filled 2018-09-19: qty 10

## 2018-09-19 MED ORDER — HEPARIN SODIUM (PORCINE) 1000 UNIT/ML IJ SOLN
INTRAMUSCULAR | Status: AC
  Filled 2018-09-19: qty 1

## 2018-09-19 MED ORDER — MIDAZOLAM HCL 2 MG/2ML IJ SOLN
2.0000 mg | INTRAMUSCULAR | Status: DC | PRN
  Administered 2018-09-19 – 2018-09-20 (×2): 2 mg via INTRAVENOUS
  Filled 2018-09-19 (×2): qty 2

## 2018-09-19 MED ORDER — ENOXAPARIN SODIUM 40 MG/0.4ML ~~LOC~~ SOLN
40.0000 mg | SUBCUTANEOUS | Status: DC
  Administered 2018-09-20 – 2018-09-21 (×2): 40 mg via SUBCUTANEOUS
  Filled 2018-09-19 (×3): qty 0.4

## 2018-09-19 MED ORDER — VERAPAMIL HCL 2.5 MG/ML IV SOLN
INTRAVENOUS | Status: DC | PRN
  Administered 2018-09-19: 10 mL via INTRA_ARTERIAL

## 2018-09-19 MED ORDER — SODIUM CHLORIDE 0.9 % IV SOLN: 250.0000 mL | INTRAVENOUS | Status: DC | PRN

## 2018-09-19 MED ORDER — VALPROATE SODIUM 500 MG/5ML IV SOLN
500.0000 mg | Freq: Three times a day (TID) | INTRAVENOUS | Status: DC
  Filled 2018-09-19: qty 5

## 2018-09-19 MED ORDER — ASPIRIN 300 MG RE SUPP
300.0000 mg | Freq: Every day | RECTAL | Status: DC
  Administered 2018-09-19 – 2018-09-20 (×2): 300 mg via RECTAL
  Filled 2018-09-19 (×2): qty 1

## 2018-09-19 MED ORDER — ACETAMINOPHEN 650 MG RE SUPP
650.0000 mg | Freq: Four times a day (QID) | RECTAL | Status: DC | PRN
  Administered 2018-09-19: 650 mg via RECTAL
  Filled 2018-09-19: qty 1

## 2018-09-19 MED ORDER — PROPOFOL 1000 MG/100ML IV EMUL
0.0000 ug/kg/min | INTRAVENOUS | Status: DC
  Administered 2018-09-19: 10 ug/kg/min via INTRAVENOUS
  Administered 2018-09-19: 30 ug/kg/min via INTRAVENOUS
  Administered 2018-09-19: 20 ug/kg/min via INTRAVENOUS
  Administered 2018-09-19 – 2018-09-20 (×7): 50 ug/kg/min via INTRAVENOUS
  Administered 2018-09-20: 25 ug/kg/min via INTRAVENOUS
  Administered 2018-09-20: 30 ug/kg/min via INTRAVENOUS
  Administered 2018-09-20: 50 ug/kg/min via INTRAVENOUS
  Administered 2018-09-20: 30 ug/kg/min via INTRAVENOUS
  Administered 2018-09-21: 25 ug/kg/min via INTRAVENOUS
  Administered 2018-09-21: 10 ug/kg/min via INTRAVENOUS
  Filled 2018-09-19: qty 200
  Filled 2018-09-19 (×2): qty 100
  Filled 2018-09-19: qty 200
  Filled 2018-09-19 (×2): qty 100
  Filled 2018-09-19: qty 200
  Filled 2018-09-19 (×3): qty 100
  Filled 2018-09-19: qty 200
  Filled 2018-09-19: qty 100

## 2018-09-19 MED ORDER — HEPARIN (PORCINE) IN NACL 1000-0.9 UT/500ML-% IV SOLN
INTRAVENOUS | Status: DC | PRN
  Administered 2018-09-19 (×2): 500 mL

## 2018-09-19 MED ORDER — LIDOCAINE HCL (PF) 1 % IJ SOLN
INTRAMUSCULAR | Status: AC
  Filled 2018-09-19: qty 30

## 2018-09-19 MED ORDER — NOREPINEPHRINE 4 MG/250ML-% IV SOLN
0.0000 ug/min | INTRAVENOUS | Status: DC
  Administered 2018-09-19 – 2018-09-20 (×2): 2 ug/min via INTRAVENOUS
  Filled 2018-09-19 (×2): qty 250

## 2018-09-19 MED ORDER — LEVETIRACETAM IN NACL 1000 MG/100ML IV SOLN
1000.0000 mg | Freq: Two times a day (BID) | INTRAVENOUS | Status: DC
  Administered 2018-09-19 – 2018-09-21 (×4): 1000 mg via INTRAVENOUS
  Filled 2018-09-19 (×4): qty 100

## 2018-09-19 MED ORDER — FENTANYL CITRATE (PF) 100 MCG/2ML IJ SOLN
100.0000 ug | INTRAMUSCULAR | Status: DC | PRN
  Administered 2018-09-20 (×2): 100 ug via INTRAVENOUS
  Filled 2018-09-19 (×4): qty 2

## 2018-09-19 MED ORDER — CHLORHEXIDINE GLUCONATE 0.12% ORAL RINSE (MEDLINE KIT)
15.0000 mL | Freq: Two times a day (BID) | OROMUCOSAL | Status: DC
  Administered 2018-09-19 – 2018-09-21 (×4): 15 mL via OROMUCOSAL

## 2018-09-19 MED ORDER — FOLIC ACID 5 MG/ML IJ SOLN
1.0000 mg | Freq: Every day | INTRAMUSCULAR | Status: DC
  Administered 2018-09-19 – 2018-09-20 (×2): 1 mg via INTRAVENOUS
  Filled 2018-09-19 (×2): qty 0.2

## 2018-09-19 MED ORDER — MIDAZOLAM HCL 2 MG/2ML IJ SOLN
2.0000 mg | INTRAMUSCULAR | Status: DC | PRN
  Administered 2018-09-20 (×2): 2 mg via INTRAVENOUS
  Filled 2018-09-19 (×3): qty 2

## 2018-09-19 MED ORDER — SODIUM CHLORIDE 0.9 % IV SOLN
INTRAVENOUS | Status: DC
  Administered 2018-09-19 – 2018-09-21 (×3): via INTRAVENOUS

## 2018-09-19 MED ORDER — INSULIN ASPART 100 UNIT/ML ~~LOC~~ SOLN
0.0000 [IU] | SUBCUTANEOUS | Status: DC
  Administered 2018-09-19: 2 [IU] via SUBCUTANEOUS
  Administered 2018-09-19: 3 [IU] via SUBCUTANEOUS
  Administered 2018-09-20 (×2): 2 [IU] via SUBCUTANEOUS

## 2018-09-19 MED ORDER — VERAPAMIL HCL 2.5 MG/ML IV SOLN
INTRAVENOUS | Status: AC
  Filled 2018-09-19: qty 2

## 2018-09-19 MED FILL — Medication: Qty: 1 | Status: AC

## 2018-09-19 SURGICAL SUPPLY — 14 items
BRACE RADIAL COMPRESSION RADST (HEMOSTASIS) ×1 IMPLANT
CATH 5FR JL3.5 JR4 ANG PIG MP (CATHETERS) ×1 IMPLANT
ELECT DEFIB PAD ADLT CADENCE (PAD) ×1 IMPLANT
GLIDESHEATH SLEND SS 6F .021 (SHEATH) ×1 IMPLANT
GUIDEWIRE INQWIRE 1.5J.035X260 (WIRE) IMPLANT
HOVERMATT SINGLE USE (MISCELLANEOUS) ×1 IMPLANT
INQWIRE 1.5J .035X260CM (WIRE) ×3
KIT HEART LEFT (KITS) ×3 IMPLANT
PACK CARDIAC CATHETERIZATION (CUSTOM PROCEDURE TRAY) ×3 IMPLANT
SHEATH PROBE COVER 6X72 (BAG) ×3 IMPLANT
TRANSDUCER W/STOPCOCK (MISCELLANEOUS) ×3 IMPLANT
TRAY CATH 3LUMEN 20C SULFAFREE (CATHETERS) ×2 IMPLANT
TRAY FOLEY SLVR 16FR TEMP STAT (SET/KITS/TRAYS/PACK) ×1 IMPLANT
TUBING CIL FLEX 10 FLL-RA (TUBING) ×3 IMPLANT

## 2018-09-19 NOTE — ED Notes (Signed)
Ice packs placed under axillary and groin

## 2018-09-19 NOTE — ED Notes (Signed)
carelink In to transport

## 2018-09-19 NOTE — ED Notes (Signed)
Pt began to become bradycardic again.  CPR started.

## 2018-09-19 NOTE — Progress Notes (Signed)
EEG complete, results pending 

## 2018-09-19 NOTE — Consult Note (Signed)
Cardiology Consult note:   Patient ID: Melvin Villarreal MRN: [id number]; DOB: 05/24/1961   Admission date: 10/05/2018  Primary Care Provider: Claretta Fraise, MD Primary Cardiologist: Minus Breeding, MD  Chief Complaint: cardiac arrest  Patient Profile:   Melvin Villarreal is a 57 y.o. male with hx of chronic combined CHF, HTN, seizure disorder, HLD with statin intolerance, nephrolithiasis and tobacco abuse transferred from [facility] and consulted for cardiac arrest by ER physician Milton Ferguson, MD.   Echo 12/2017 showed LVEF of 40-45%, PA pressure of 79mm Hg. Follow up cath as below: recommended medical therapy.   Prox RCA lesion is 95% stenosed.  Mid RCA lesion is 95% stenosed.  Dist RCA lesion is 80% stenosed.  Mid LM to Dist LM lesion is 40% stenosed.  Ost LAD to Prox LAD lesion is 50% stenosed.  Prox LAD to Mid LAD lesion is 50% stenosed.  Prox Cx to Mid Cx lesion is 70% stenosed.  Ost 1st Mrg to 1st Mrg lesion is 30% stenosed.  The left ventricular ejection fraction is 45-50% by visual estimate.  LV end diastolic pressure is normal.  Hemodynamic findings consistent with moderate pulmonary hypertension.  LV end diastolic pressure is normal.   1.  Moderate pulmonary hypertension with pulmonary vascular resistance approximately 3 Wood units 2.  Multivessel coronary artery disease with chronic subtotal occlusion of the RCA with multiple severe stenoses and left to right collateral filling of the distal branch vessels, moderate irregular stenoses of the LAD, and moderate stenosis of the AV circumflex 3.  Normal LVEDP  Recommendations: The patient appears to primarily have hemodynamic findings consistent with right heart failure.  Fortunately his cardiac output is preserved.  I would suggest aggressive medical therapy for his coronary artery disease.  Last seen by Dr. Percival Spanish 121/20/19. Stopped Spironolactone due to elevated SCr and hyperkalemia. Increased coreg to 18.75mg   BID. Advised to comply with low salt diet and fluid restriction. Refused sleep study.    History of Present Illness:   Melvin Villarreal was at radiology department for renal ultrasound where he had cardiac arrest. He was fine when seen by radiology staff at [phone] however found unresponsive in waiting area 0705. Noted V.fib arrest s/p CPR, multiple defibrillation, epi x 4, lidocaine, amiodarone, bicarb and magnesium. He was intubated in ER at [facility] and cooling protocol initiated after discussion with critical care team. Transferred to [facility] for cath.   Scr 0.8>>1.25. Lactic acid 7.87. POC troponin 0.06. Hgb 107.9. EKG with afib and RBBB and diffuse ST changes, looks like inferior STEMI - personally reviewed.   He received 2.5 of versed en route. Up arrival to [facility] ER he was having intermittent jerking movement, intubated. Taken to cath lab directly and hard time getting new IV line. Started on Versed and Fentanyl. EKG in cath lab showed sinus rhythm with resolved RBBB and ST changes - personally reviewed.    Past Medical History:  Diagnosis Date  . GASTRIC ULCER, ACUTE, HEMORRHAGE 12/28/2008   Qualifier: Diagnosis of  By: Deatra Ina MD, Sandy Salaam   . Hypertension   . Nephrolithiasis   . NEPHROLITHIASIS, HX OF 12/28/2008   Qualifier: Diagnosis of  By: Marland Mcalpine    . Seizures ([place])     Past Surgical History:  Procedure Laterality Date  . COLONOSCOPY WITH PROPOFOL N/A 12/14/2017   Procedure: COLONOSCOPY WITH PROPOFOL;  Surgeon: Yetta Flock, MD;  Location: [facility];  Service: Gastroenterology;  Laterality: N/A;  . HAND SURGERY Left  Collapsed vein  . RIGHT/LEFT HEART CATH AND CORONARY ANGIOGRAPHY N/A 01/12/2018   Procedure: RIGHT/LEFT HEART CATH AND CORONARY ANGIOGRAPHY;  Surgeon: Sherren Mocha, MD;  Location: [place] [facility];  Service: Cardiovascular;  Laterality: N/A;     Medications Prior to Admission: Prior to Admission medications   Medication Sig Start Date End Date  Taking? Authorizing Provider  acetaminophen (TYLENOL) 650 MG CR tablet Take 1,300 mg by mouth every 8 (eight) hours as needed for pain.    [provider]  aspirin EC 81 MG EC tablet Take 1 tablet (81 mg total) by mouth daily. 01/13/18   [facility], Tami Lin, PA  carvedilol (COREG) 12.5 MG tablet Take 1.5 tablets (18.75 mg total) by mouth 2 (two) times daily. 08/31/18   Minus Breeding, MD  lisinopril (PRINIVIL,ZESTRIL) 40 MG tablet TAKE 1 TABLET BY MOUTH EVERY DAY 04/19/18   Claretta Fraise, MD  phenytoin (DILANTIN) 100 MG ER capsule TAKE 8 CAPSULES BY MOUTH AT BEDTIME 07/22/18   Claretta Fraise, MD  torsemide (DEMADEX) 20 MG tablet Take 2 tablets (40 mg total) by mouth daily. 07/22/18   Claretta Fraise, MD  Vitamin D, Ergocalciferol, (DRISDOL) 50000 units CAPS capsule Take 1 capsule (50,000 Units total) by mouth every 7 (seven) days. Patient taking differently: Take 50,000 Units by mouth every Tuesday.  11/02/17   Sharion Balloon, FNP     Allergies:    Allergies  Allergen Reactions  . Other     Mayonase, makes him break out in hives     Social History:   Social History   Socioeconomic History  . Marital status: Single    Spouse name: Not on file  . Number of children: 0  . Years of education: Not on file  . Highest education level: Not on file  Occupational History  . Occupation: Maintenance    Comment: [employer]  Social Needs  . Financial resource strain: Not on file  . Food insecurity:    Worry: Not on file    Inability: Not on file  . Transportation needs:    Medical: Not on file    Non-medical: Not on file  Tobacco Use  . Smoking status: Former Smoker    Packs/day: 1.50    Years: 35.00    Pack years: 52.50    Types: Cigarettes  . Smokeless tobacco: Never Used  . Tobacco comment: Quit March 2019  Substance and Sexual Activity  . Alcohol use: No    Frequency: Never  . Drug use: No  . Sexual activity: Not on file  Lifestyle  . Physical  activity:    Days per week: Not on file    Minutes per session: Not on file  . Stress: Not on file  Relationships  . Social connections:    Talks on phone: Not on file    Gets together: Not on file    Attends religious service: Not on file    Active member of club or organization: Not on file    Attends meetings of clubs or organizations: Not on file    Relationship status: Not on file  . Intimate partner violence:    Fear of current or ex partner: Not on file    Emotionally abused: Not on file    Physically abused: Not on file    Forced sexual activity: Not on file  Other Topics Concern  . Not on file  Social History Narrative   Lives alone.  Works maintenance.  Family History:  The patient's family history includes Cancer (age of onset: 18) in his mother; Heart failure (age of onset: 87) in his brother.    ROS:  Please see the history of present illness.  All other ROS reviewed and negative.     Physical Exam/Data:   Vitals:   10/09/2018 0804 09/18/2018 0817 09/15/2018 0818 09/20/2018 0826  BP:   (!) 82/41 (!) 96/57  Pulse:   87   Resp:      Temp:  97.8 F (36.6 C)    TempSrc:  Rectal    SpO2: 100%      No intake or output data in the 24 hours ending 10/04/2018 0836 There were no vitals filed for this visit. There is no height or weight on file to calculate BMI.  General:  Ill appearing intubated obese male HEENT: normal Lymph: no adenopathy Neck: no JVD Endocrine:  No thryomegaly Vascular: No carotid bruits; FA pulses 2+ bilaterally without bruits  Cardiac:  normal S1, S2; RRR; no murmur  Lungs:  clear to auscultation bilaterally, no wheezing, rhonchi or rales  Abd: soft, nontender, no hepatomegaly  Ext: puffy leds with chronic skin discoloration  Musculoskeletal:  No deformities, BUE and BLE strength normal and equal Skin: warm and dry  Neuro:  CNs 2-12 intact, no focal abnormalities noted Psych:  Normal affect   Relevant CV Studies: RIGHT/LEFT HEART CATH AND  CORONARY ANGIOGRAPHY 01/12/18  Conclusion     Prox RCA lesion is 95% stenosed.  Mid RCA lesion is 95% stenosed.  Dist RCA lesion is 80% stenosed.  Mid LM to Dist LM lesion is 40% stenosed.  Ost LAD to Prox LAD lesion is 50% stenosed.  Prox LAD to Mid LAD lesion is 50% stenosed.  Prox Cx to Mid Cx lesion is 70% stenosed.  Ost 1st Mrg to 1st Mrg lesion is 30% stenosed.  The left ventricular ejection fraction is 45-50% by visual estimate.  LV end diastolic pressure is normal.  Hemodynamic findings consistent with moderate pulmonary hypertension.  LV end diastolic pressure is normal.   1.  Moderate pulmonary hypertension with pulmonary vascular resistance approximately 3 Wood units 2.  Multivessel coronary artery disease with chronic subtotal occlusion of the RCA with multiple severe stenoses and left to right collateral filling of the distal branch vessels, moderate irregular stenoses of the LAD, and moderate stenosis of the AV circumflex 3.  Normal LVEDP  Recommendations: The patient appears to primarily have hemodynamic findings consistent with right heart failure.  Fortunately his cardiac output is preserved.  I would suggest aggressive medical therapy for his coronary artery disease.   Diagnostic  Dominance: Right    Echo 12/29/17 Study Conclusions  - Left ventricle: Diffuse hypokinesis with abnormal septal motion.   The cavity size was normal. Wall thickness was increased in a   pattern of moderate LVH. Systolic function was mildly to   moderately reduced. The estimated ejection fraction was in the   range of 40% to 45%. - Left atrium: The atrium was mildly dilated. - Right ventricle: The cavity size was moderately dilated. - Right atrium: The atrium was moderately dilated. - Atrial septum: No defect or patent foramen ovale was identified. - Pulmonary arteries: PA peak pressure: 55 mm Hg (S).  Laboratory Data:  Chemistry Recent Labs  Lab 10/06/2018 0740  10/09/2018 0751  NA 137 137  K 5.3* 3.6  CL 111 101  CO2  --  19*  GLUCOSE 252* 341*  BUN 23* 20  CREATININE 0.80 1.25*  CALCIUM  --  8.4*  GFRNONAA  --  >60  GFRAA  --  >60  ANIONGAP  --  17*    Recent Labs  Lab 09/20/2018 0751  PROT 7.4  ALBUMIN 3.4*  AST 36  ALT 26  ALKPHOS 110  BILITOT 0.7   Hematology Recent Labs  Lab 09/28/2018 0740 09/15/2018 0751  WBC  --  14.8*  RBC  --  3.67*  HGB 9.5* 11.3*  HCT 28.0* 39.6  MCV  --  107.9*  MCH  --  30.8  MCHC  --  28.5*  RDW  --  12.5  PLT  --  237    Recent Labs  Lab 09/15/2018 0750  TROPIPOC 0.06    Radiology/Studies:  No results found.  Assessment and Plan:   1. AFib cardiac arrest - Unwitnessed. He was seen normal at 0653 however found unresponsive in waiting area 0705 by radiology staff. S/p CPR, multiple defibrillation, epi x 4, lidocaine, amiodarone, bicarb and magnesium. - Initial EKG with inferior ST with RBBB which resolved upon arrival to cath lab. On amiodarone. Pending cath. Critical care team is here.  2. Hx of seizure - Phenytoin listed in home meds. Will check level.   For questions or updates, please contact [facility] Please consult [email or web address] for contact info under        Jarrett Soho, PA  10/04/2018 8:36 AM

## 2018-09-19 NOTE — Consult Note (Addendum)
Neurology Consultation  Reason for Consult: Prognostication Referring Physician: Dr. Nelda Marseille  CC: Cardiac arrest up to 45 minutes.  Currently having myoclonic activity.  Asked to assess for prognostication  History is obtained from: Heart  HPI: Melvin Villarreal is a 57 y.o. male with history of seizures, hypertension, gastric ulcers.  Unfortunately patient is intubated and sedated so the majority of the history is due to reading the chart.  Per chart, patient presented independently to the Radiology department for scheduled ultrasound.  He was found unresponsive at 7:05 AM in the waiting area.  He was placed on telemetry and found to be on V. fib arrest.  He underwent multiple rounds of ACLS and multiple rounds of defibrillation with epinephrine x 4, lidocaine, bicarb and return of spontaneous circulation estimated to be about  45 minutes.  Patient was transferred from [facility] to [facility] where he exhibited pinpoint pupils and myoclonic jerking on follow up exams.  He went for an emergent catheterization in the cardiac Cath Lab.  On arrival he was found to be in A. Fib; ECG showed RBBB and no ST elevation.  Cardiac catheterization showed multivessel coronary artery disease with chronic total occlusion of the RCA and multiple severe stenoses.  Currently the patient is having a difficult time with his pressures.  Pressors are needed.  Systolically he is in the 80s.  Given the unknown downtime, elevated WBC and concern for sepsis from aspiration along with the evidence of mild clonus, cooling protocol was not undertaken.   ROS: Unable to obtain due to altered mental status.   Past Medical History:  Diagnosis Date  . GASTRIC ULCER, ACUTE, HEMORRHAGE 12/28/2008   Qualifier: Diagnosis of  By: Deatra Ina MD, Sandy Salaam   . Hypertension   . Nephrolithiasis   . NEPHROLITHIASIS, HX OF 12/28/2008   Qualifier: Diagnosis of  By: Marland Mcalpine    . Seizures ([place])      Family History   Problem Relation Age of Onset  . Heart failure Brother 25  . Cancer Mother 73       Breast.  Died age 25    Social History:   reports that he has quit smoking. His smoking use included cigarettes. He has a 52.50 pack-year smoking history. He has never used smokeless tobacco. He reports that he does not drink alcohol or use drugs.  Medications  Current Facility-Administered Medications:  .  0.9 %  sodium chloride infusion, 250 mL, Intravenous, PRN, Salvadore Dom E, NP .  0.9 %  sodium chloride infusion, , Intravenous, Continuous, [name], NP, Last Rate: 50 mL/hr at 09/16/2018 1156 .  acetaminophen (TYLENOL) tablet 650 mg, 650 mg, Oral, Q4H PRN, [name], NP .  amiodarone (NEXTERONE PREMIX) 360-4.14 MG/200ML-% (1.8 mg/mL) IV infusion, 60 mg/hr, Intravenous, Continuous, Babcock, Peter E, NP .  amiodarone (NEXTERONE PREMIX) 360-4.14 MG/200ML-% (1.8 mg/mL) IV infusion, 30 mg/hr, Intravenous, Continuous, [name], NP, Last Rate: 16.67 mL/hr at 09/20/2018 0744, 30 mg/hr at 10/07/2018 0744 .  aspirin suppository 300 mg, 300 mg, Rectal, Daily, [name], NP .  Derrill Memo ON 09/20/2018] enoxaparin (LOVENOX) injection 40 mg, 40 mg, Subcutaneous, Q24H, Salvadore Dom E, NP .  fentaNYL (SUBLIMAZE) injection 100 mcg, 100 mcg, Intravenous, Q15 min PRN, [name], NP .  fentaNYL (SUBLIMAZE) injection 100 mcg, 100 mcg, Intravenous, Q2H PRN, [name], NP .  folic acid injection 1 mg, 1 mg, Intravenous, Daily, [name], NP .  insulin aspart (novoLOG) injection 0-15 Units, 0-15 Units, Subcutaneous, Q4H, [name], NP .  midazolam (VERSED) injection 2 mg, 2 mg, Intravenous, Q15 min PRN, [name], NP .  midazolam (VERSED) injection 2 mg, 2 mg, Intravenous, Q2H PRN, [name], NP .  norepinephrine (LEVOPHED) 4mg  in D5W 267mL premix infusion, 0-40 mcg/min, Intravenous, Titrated, [name], NP .  ondansetron (ZOFRAN) injection 4 mg, 4 mg,  Intravenous, Q6H PRN, [name], NP .  Derrill Memo ON 09/20/2018] phenytoin (DILANTIN) chewable tablet 400 mg, 400 mg, Per Tube, BID, [name], NP .  propofol (DIPRIVAN) 1000 MG/100ML infusion, 0-50 mcg/kg/min, Intravenous, Continuous, [name], NP .  sodium chloride flush (NS) 0.9 % injection 3 mL, 3 mL, Intravenous, Q12H, [name], NP .  thiamine (B-1) injection 100 mg, 100 mg, Intravenous, Daily, [name], NP .  valproate (DEPACON) 500 mg in dextrose 5 % 50 mL IVPB, 500 mg, Intravenous, Q8H, [name], NP   Exam: Current vital signs: BP 104/61   Pulse 81   Temp 97.8 F (36.6 C) (Rectal)   Resp (!) 22   SpO2 100%  Vital signs in last 24 hours: Temp:  [97.8 F (36.6 C)] 97.8 F (36.6 C) (12/09 0817) Pulse Rate:  [74-87] 81 (12/09 1110) Resp:  [13-27] 22 (12/09 1110) BP: (82-125)/(41-69) 104/61 (12/09 1110) SpO2:  [92 %-100 %] 100 % (12/09 1132) FiO2 (%):  [100 %] 100 % (12/09 1130)  Physical Exam  Constitutional: Morbidly obese  Eyes: No scleral injection HENT: No OP obstrucion Head: Normocephalic.  GI: Soft.  No distension. There is no tenderness.  Skin: WDI  Mental Status: Patient does not respond to verbal stimuli.  Does not respond to deep sternal rub.  Does not follow commands.  No verbalizations noted. --At times patient will respond with high amplitude full-body myoclonic jerks to stimulation but this also occurs spontaneously Cranial Nerves: II: patient does not blink to confrontation bilaterally,  III,IV,VI: doll's response present bilaterally. pupils right 2 mm, left 2 mm,and sluggishly reactive bilaterally V,VII: corneal reflexes present bilaterally  VIII: patient does not respond to verbal stimuli IX,X: gag reflex present XI: trapezius strength unable to test bilaterally XII: tongue strength unable to test Motor: Extremities flaccid throughout.  No spontaneous movement noted.  No purposeful movements noted.  Myoclonic  jerking that occurs in bursts of several rapid full-body jerks lasting from 2-10 seconds, occurring every 3 to 5 minutes when not stimulated, more frequently when stimulated Sensory: Does not withdraw or posture to noxious stimuli in any extremity. Deep Tendon Reflexes: Absent throughout. Plantars: equivocal bilaterally Cerebellar/Gait: Unable to perform   Labs I have reviewed labs in epic and the results pertinent to this consultation are:   CBC    Component Value Date/Time   WBC 14.8 (H) 09/11/2018 0751   RBC 3.67 (L) 09/15/2018 0751   HGB 11.3 (L) 10/01/2018 0751   HGB 10.5 (L) 07/22/2018 1737   HCT 39.6 10/11/2018 0751   HCT 30.9 (L) 07/22/2018 1737   PLT 237 09/20/2018 0751   PLT 224 07/22/2018 1737   MCV 107.9 (H) 09/14/2018 0751   MCV 97 07/22/2018 1737   MCH 30.8 10/08/2018 0751   MCHC 28.5 (L) 10/03/2018 0751   RDW 12.5 09/15/2018 0751   RDW 12.8 07/22/2018 1737   LYMPHSABS 4.5 (H) 09/14/2018 0751   LYMPHSABS 1.6 07/22/2018 1737   MONOABS 0.7 09/26/2018 0751   EOSABS 0.1 09/17/2018 0751  EOSABS 0.2 07/22/2018 1737   BASOSABS 0.1 09/28/2018 0751   BASOSABS 0.0 07/22/2018 1737    CMP     Component Value Date/Time   NA 137 09/20/2018 0751   NA 139 07/22/2018 1737   K 3.6 09/22/2018 0751   CL 101 09/14/2018 0751   CO2 19 (L) 10/03/2018 0751   GLUCOSE 341 (H) 09/18/2018 0751   BUN 20 09/22/2018 0751   BUN 67 (H) 07/22/2018 1737   CREATININE 1.25 (H) 09/24/2018 0751   CALCIUM 8.4 (L) 09/29/2018 0751   PROT 7.4 09/17/2018 0751   PROT 6.9 07/22/2018 1737   ALBUMIN 3.4 (L) 09/18/2018 0751   ALBUMIN 4.2 07/22/2018 1737   AST 36 09/23/2018 0751   ALT 26 09/20/2018 0751   ALKPHOS 110 10/10/2018 0751   BILITOT 0.7 09/25/2018 0751   BILITOT <0.2 07/22/2018 1737   GFRNONAA >60 10/03/2018 0751   GFRAA >60 10/02/2018 0751    Lipid Panel     Component Value Date/Time   CHOL 186 11/01/2017 0928   TRIG 64 11/01/2017 0928   HDL 46 11/01/2017 0928   CHOLHDL  4.0 11/01/2017 0928   LDLCALC 127 (H) 11/01/2017 0928     Imaging  CT-scan of the brain--unable to be obtained secondary to myoclonic activity   Etta Quill PA-C [facility] [phone] 10/05/2018, 12:18 PM    Assessment: 57 year old male with multiple cardiac issues, presenting following cardiac arrest, with exam findings that are most consistent with diffuse anoxic brain injury.   1. It is estimated that the patient was down for a total of 45 minutes prior to ROSC.   2. Currently showing myoclonic activity, with no other limb movement. There is preservation of some brainstem reflexes.   Recommendations: - CT head when stable - EEG - MRI when stable and if myoclonus resolved with anticonvulsant treatment, or if sedated on propofol, Precedex or Versed to facilitate MRI scanning.  - Started Keppra with loading dose of 1500 mg to be followed by 1000 mg IV twice daily -Continue patient's Dilantin via tube -- Prognosis likely to be poor given myoclonus this soon after cardiac arrest. However, will need repeat exam 72 hours after event off all sedation for more accurate prognostication.   A total of 45 minutes were spent in the emergent neurological evaluation and management of this critically ill patient.   I have seen and examined the patient. I have formulated the assessment and plan. History taken from chart. 57 year old male s/p cardiac arrest with probable anoxic brain injury and myoclonus. Diagnostic and treatment plan includes imaging, EEG, initiation of Keppra and continuation of Dilantin. Neurology will continue to follow. Electronically signed: Dr. Kerney Elbe

## 2018-09-19 NOTE — Progress Notes (Signed)
[place] Progress Note Patient Name: ARLANDER GILLEN DOB: 1960/12/11 MRN: [id number]   Date of Service  09/17/2018  HPI/Events of Note  Patient is NPO. Nursing unable to get NGT placed. Current orders for Tylenol PO. Request to change to Tylenol Suppository.   eICU Interventions  Will order: 1. D/C Tylenol PO. 2. Tylenol Suppository 650 mg PR Q 6 hours PRN Temp > 100.5 F or pain.     Intervention Category Major Interventions: Other:  Lysle Dingwall 09/20/2018, 11:39 PM

## 2018-09-19 NOTE — Code Documentation (Signed)
Pt arrived to the ED from radiology waiting area.  See paper code sheet for events prior to arrival to ED.

## 2018-09-19 NOTE — Consult Note (Addendum)
NAME:  Melvin Villarreal, MRN:  [id number], DOB:  Jul 17, 1961, LOS: 0 ADMISSION DATE:  09/25/2018, CONSULTATION DATE:  12/9 REFERRING MD:  [place] , CHIEF COMPLAINT:   Cardiac arrest   Brief History   Patient status post V. fib arrest on 12/9. Time to ROSC estimated ~ 12 minutes. Myoclonic jerking on arrival [facility].   History of present illness   57 year old male w/ multiple co-morbids (see below), presented independently radiology department for scheduled ultrasound.  He was found unresponsive at 7:05 AM in the waiting area.  He was placed on telemetry and be V. fib arrest.  He underwent multiple rounds of ACLS CVA multiple defibrillations, epinephrine x4, lidocaine, and bicarbonate, prior to ROSC (estimate time to ROSC at least 12 minutes based on receiving 4 doses of epi) he was transferred post cardiac arrest.  On arrival to [facility] he was orally intubated and mechanically ventilated he was exhibiting pinpoint pupils and myoclonic jerking he was brought emergently to the cardiac catheterization lab to look for the culprit vessel.  Critical care was asked to evaluate cardiac Cath Lab.   Past Medical History  CHF (EF 40-45%), CAD 3V (treated medically), PH, HTN, seizure d/o, nephrolithiasis  Significant Hospital Events   12/9: Admitted to [facility] status post V. fib arrest  Consults:  Critical care  Procedures:  oett 12/9>>> Left IJ CVL 12/9>>>  Significant Diagnostic Tests:  Left Heart cath 12/9  Micro Data:    Antimicrobials:    Interim history/subjective:  Unresponsive intermittent myoclonic jerking   Objective   Blood pressure (Abnormal) 88/49, pulse 87, temperature 97.8 F (36.6 C), temperature source Rectal, resp. rate (Abnormal) 24, SpO2 100 %.    Vent Mode: PRVC FiO2 (%):  [100 %] 100 % Set Rate:  [24 bmp] 24 bmp Vt Set:  [412 mL] 680 mL PEEP:  [5 cmH20] 5 cmH20  No intake or output data in the 24 hours ending 09/26/2018 1054 There were no vitals  filed for this visit.  Examination: General: Obese unresponsive 57 year old male currently controlled ventilatory support HENT: Intubated pupils fixed and dilated Lungs: scattered rhonchi equal chest rise  Cardiovascular: regular irreg  Abdomen: soft hypoactive no OM Extremities: cool pulses are palp  Neuro: GCS 3. On-going intermittent myoclonus  GU: due to void   Resolved Hospital Problem list     Assessment & Plan:  S/p VF arrest in setting of known 3V CAD and underlying ICM (EF 40-45%) -currently in cardiac cath lab Plan Cont tele Anti-platelets per cardiology F/u left heart cath results  Supportive care  Acute respiratory failure s/p cardiac arrest Pulmonary edema on CXR pcxr personally reviewed demonstrating evolving pulmonary edema. ETT in satisfactory position  Plan Full vent support  F/u abg (was underventilated on last ABG check) PAD protocol RASS goal -1 to -2 VAP bundle Lasix when hemodynamically can tolerate    Acute metabolic/hypoxic  Encephalopathy. Myoclonic Jerking on arrival. Has h/o seizure d/o -total down time/time to ROSC not clear. Myoclonic on arrival  -not candidate for hypothermia given unknown down time.  Plan EEG CT head freq neuro checks Will likely need neuro eval   Anion gap metabolic acidosis w/ Lactic acidosis s/p cardiac arrest Plan Repeat LA (this is not due to sepsis)  AKI  Plan Avoid hypotension Renal dose meds Hydration as able given contrast from cardiac cath  Strict I&O Repeat chem in am    Macrocytic anemia  Plan Trend cbc   Leukocytosis ->  likely reactive Plan Trend cbc  Hyperglycemia Plan  ssi   Best practice:  Diet: NPO Pain/Anxiety/Delirium protocol (if indicated): 12/9 VAP protocol (if indicated): 12/9 DVT prophylaxis: [place] heparin  GI prophylaxis: PPI Glucose control: ssi Mobility: BR Code Status: full code  Family Communication: pending  Disposition: critically ill s/p prolonged cardiac arrest.     Labs   CBC: Recent Labs  Lab 09/27/2018 0740 10/07/2018 0751  WBC  --  14.8*  NEUTROABS  --  8.3*  HGB 9.5* 11.3*  HCT 28.0* 39.6  MCV  --  107.9*  PLT  --  427    Basic Metabolic Panel: Recent Labs  Lab 09/12/2018 0740 09/27/2018 0751  NA 137 137  K 5.3* 3.6  CL 111 101  CO2  --  19*  GLUCOSE 252* 341*  BUN 23* 20  CREATININE 0.80 1.25*  CALCIUM  --  8.4*   GFR: CrCl cannot be calculated (Unknown ideal weight.). Recent Labs  Lab 09/20/2018 0751 09/18/2018 0754  WBC 14.8*  --   LATICACIDVEN  --  7.87*    Liver Function Tests: Recent Labs  Lab 09/24/2018 0751  AST 36  ALT 26  ALKPHOS 110  BILITOT 0.7  PROT 7.4  ALBUMIN 3.4*   No results for input(s): LIPASE, AMYLASE in the last 168 hours. No results for input(s): AMMONIA in the last 168 hours.  ABG    Component Value Date/Time   PHART 7.226 (L) 09/23/2018 0800   PCO2ART 48.7 (H) 09/23/2018 0800   PO2ART 395 (H) 09/30/2018 0800   HCO3 18.3 (L) 09/16/2018 0800   TCO2 19 (L) 10/11/2018 0740   ACIDBASEDEF 6.9 (H) 10/04/2018 0800   O2SAT 99.6 09/25/2018 0800     Coagulation Profile: No results for input(s): INR, PROTIME in the last 168 hours.  Cardiac Enzymes: No results for input(s): CKTOTAL, CKMB, CKMBINDEX, TROPONINI in the last 168 hours.  HbA1C: No results found for: HGBA1C  CBG: Recent Labs  Lab 09/23/2018 0737  GLUCAP 178*    Review of Systems:   Not able   Past Medical History  He,  has a past medical history of GASTRIC ULCER, ACUTE, HEMORRHAGE (12/28/2008), Hypertension, Nephrolithiasis, NEPHROLITHIASIS, HX OF (12/28/2008), and Seizures ([place]).   Surgical History    Past Surgical History:  Procedure Laterality Date  . COLONOSCOPY WITH PROPOFOL N/A 12/14/2017   Procedure: COLONOSCOPY WITH PROPOFOL;  Surgeon: Yetta Flock, MD;  Location: [facility];  Service: Gastroenterology;  Laterality: N/A;  . HAND SURGERY Left    Collapsed vein  . RIGHT/LEFT HEART CATH AND CORONARY  ANGIOGRAPHY N/A 01/12/2018   Procedure: RIGHT/LEFT HEART CATH AND CORONARY ANGIOGRAPHY;  Surgeon: Sherren Mocha, MD;  Location: [facility];  Service: Cardiovascular;  Laterality: N/A;     Social History   reports that he has quit smoking. His smoking use included cigarettes. He has a 52.50 pack-year smoking history. He has never used smokeless tobacco. He reports that he does not drink alcohol or use drugs.   Family History   His family history includes Cancer (age of onset: 50) in his mother; Heart failure (age of onset: 58) in his brother.   Allergies Allergies  Allergen Reactions  . Other     Mayonase, makes him break out in hives      Home Medications  Prior to Admission medications   Medication Sig Start Date End Date Taking? Authorizing Provider  acetaminophen (TYLENOL) 650 MG CR tablet Take 1,300 mg by mouth every 8 (  eight) hours as needed for pain.    [provider]  aspirin EC 81 MG EC tablet Take 1 tablet (81 mg total) by mouth daily. 01/13/18   [facility], Tami Lin, PA  carvedilol (COREG) 12.5 MG tablet Take 1.5 tablets (18.75 mg total) by mouth 2 (two) times daily. 08/31/18   Minus Breeding, MD  lisinopril (PRINIVIL,ZESTRIL) 40 MG tablet TAKE 1 TABLET BY MOUTH EVERY DAY 04/19/18   Claretta Fraise, MD  phenytoin (DILANTIN) 100 MG ER capsule TAKE 8 CAPSULES BY MOUTH AT BEDTIME 07/22/18   Claretta Fraise, MD  torsemide (DEMADEX) 20 MG tablet Take 2 tablets (40 mg total) by mouth daily. 07/22/18   Claretta Fraise, MD  Vitamin D, Ergocalciferol, (DRISDOL) 50000 units CAPS capsule Take 1 capsule (50,000 Units total) by mouth every 7 (seven) days. Patient taking differently: Take 50,000 Units by mouth every Tuesday.  11/02/17   Sharion Balloon, FNP     Critical care time: 40 min       [name] ACNP-BC [place] Pager # [phone] OR # [phone] if no answer  Attending Note:  57 year old male with cardiac arrest, unknown downtime who  is already having evidence of myoclonus on exam with distant BS diffusely.  I reviewed CXR myself, concern for RLL infiltrate noted, unsure if there was aspiration during intubation or not.  Patient was taken directly to the cathlab, no interventions were needed as the ST segment elevation had resolved by then.  Given unknown downtime, elevated WBC and concern for sepsis from aspiration as well as evidence of myoclonus, will not proceed with cooling, pan culture, treat as aspiration pneumonia.  PCCM will admit to the ICU and continue to follow.  The patient is critically ill with multiple organ systems failure and requires high complexity decision making for assessment and support, frequent evaluation and titration of therapies, application of advanced monitoring technologies and extensive interpretation of multiple databases.   Critical Care Time devoted to patient care services described in this note is  90  Minutes. This time reflects time of care of this signee Dr [name]. This critical care time does not reflect procedure time, or teaching time or supervisory time of PA/NP/Med student/Med Resident etc but could involve care discussion time.  [name], M.D. [facility] Pulmonary/Critical Care Medicine. Pager: [phone]. After hours pager: [phone].

## 2018-09-19 NOTE — Progress Notes (Signed)
Report called to Tommie Sams [facility]

## 2018-09-19 NOTE — Anesthesia Procedure Notes (Signed)
Procedure Name: Intubation Performed by: Charmaine Downs, CRNA Pre-anesthesia Checklist: Patient identified, Patient being monitored, Emergency Drugs available and Suction available Oxygen Delivery Method: Ambu bag Ventilation: Mask ventilation without difficulty Laryngoscope Size: Mac and 4 Grade View: Grade II Tube type: Subglottic suction tube Tube size: 8.0 mm Number of attempts: 1 Airway Equipment and Method: Stylet Placement Confirmation: ETT inserted through vocal cords under direct vision,  positive ETCO2,  breath sounds checked- equal and bilateral and CO2 detector Secured at: 22 cm Tube secured with: Tape Dental Injury: Teeth and Oropharynx as per pre-operative assessment  Comments: Called to see patient.CPR in progress in Radiology waiting area.Intubated without meds.End anesthesia care with code team leader running code.

## 2018-09-19 NOTE — ED Triage Notes (Signed)
Pt arrived to ED after being found in radiology waiting area on floor unresponsive.  Pt was here for renal ultrasound and was seen by radiology staff at [phone].  Found unresponsive at 0705 and cpr initiated by staff.

## 2018-09-19 NOTE — Progress Notes (Signed)
Pharmacy Antibiotic Note  Melvin Villarreal is a 57 y.o. male admitted on 10/02/2018 with cardiac arrest.  Pharmacy has been consulted for Unasyn dosing for possible aspiration.  Plan: Unasyn 3g IV q8h Monitor LOT, renal funx, cultures  Weight: (!) 391 lb 15.7 oz (177.8 kg)  Temp (24hrs), Avg:97.8 F (36.6 C), Min:97.8 F (36.6 C), Max:97.8 F (36.6 C)  Recent Labs  Lab 09/24/2018 0740 09/13/2018 0751 10/08/2018 0754  WBC  --  14.8*  --   CREATININE 0.80 1.25*  --   LATICACIDVEN  --   --  7.87*    Estimated Creatinine Clearance: 115.7 mL/min (A) (by C-G formula based on SCr of 1.25 mg/dL (H)).    Allergies  Allergen Reactions  . Other     Mayonase, makes him break out in hives     Antimicrobials this admission: Unasyn 12/9 >>   Dose adjustments this admission: none  Microbiology results: Sent   Thank you for allowing pharmacy to be a part of this patient's care.  Arrie Senate, PharmD, BCPS Clinical Pharmacist Please check AMION for all [facility] numbers 10/02/2018

## 2018-09-19 NOTE — ED Notes (Signed)
Report given to Elba at [facility] lab.

## 2018-09-19 NOTE — Code Documentation (Signed)
Palpable pulse and organized rhythm.

## 2018-09-19 NOTE — Procedures (Signed)
ELECTROENCEPHALOGRAM REPORT   Patient: Melvin Villarreal       Room #: 2H24C EEG No. ID: [id number] Age: 57 y.o.        Sex: male Referring Physician: Nelda Marseille Report Date:  10/08/2018        Interpreting Physician: Alexis Goodell  History: Melvin Villarreal is an 57 y.o. male s/p arrest with myoclonus  Medications:  Unasyn, Folic acid, Insulin, Keppra, Dilantin, Thiamine, Amiodarone, Levophed, Diprovan  Conditions of Recording:  This is a 21 channel routine scalp EEG performed with bipolar and monopolar montages arranged in accordance to the international 10/20 system of electrode placement. One channel was dedicated to EKG recording.  The patient is in the intubated and sedated state.  Description:  The background activity is severely attenuated.  There are occasional, infrequent bursts of polyspike, spike and slow wave activity lasting 1-2 seconds .  These periods occur when the patient is clinically noted to have generalized myoclonic jerks. This discontinuous rhythm is persistent throughout the recording.   Hyperventilation and intermittent photic stimulation were not performed.   IMPRESSION: This is an abnormal electroencephalogram secondary to a burst suppression rhythm where the bursts coincide with clinical generalized myoclonic activity therefore suggesting an ictal etiology.     Alexis Goodell, MD Neurology [phone] 09/25/2018, 3:39 PM

## 2018-09-19 NOTE — ED Provider Notes (Signed)
[facility] EMERGENCY DEPARTMENT Provider Note   CSN: [id number] Arrival date & time: 10/08/2018  0734     History   Chief Complaint Chief Complaint  Patient presents with  . Cardiac Arrest    HPI Melvin Villarreal is a 57 y.o. male.  Patient was in the radiology waiting room and arrested.  He was last seen normal 5 minutes prior to the arrest.  History provided by: X-ray department.  Cardiac Arrest  Witnessed by:  Not witnessed Incident location: X-ray waiting room. Time since incident:  5 minutes Time before BLS initiated:  3-5 minutes Time before ALS initiated:  3-5 minutes Condition upon EMS arrival:  Apneic Pulse:  Absent Initial cardiac rhythm per EMS:  Atrial fibrillation Treatments prior to arrival:  ACLS protocol Medications given prior to ED:  Epinephrine and amiodarone Airway:  Bag valve mask Rhythm on admission to ED:  Unchanged   Past Medical History:  Diagnosis Date  . GASTRIC ULCER, ACUTE, HEMORRHAGE 12/28/2008   Qualifier: Diagnosis of  By: Deatra Ina MD, Sandy Salaam   . Hypertension   . Nephrolithiasis   . NEPHROLITHIASIS, HX OF 12/28/2008   Qualifier: Diagnosis of  By: Marland Mcalpine    . Seizures [facility])     Patient Active Problem List   Diagnosis Date Noted  . Leg swelling 08/31/2018  . Normocytic anemia 07/28/2018  . Coronary artery disease involving native coronary artery of native heart without angina pectoris 04/25/2018  . Dyslipidemia 04/25/2018  . Acute on chronic systolic CHF (congestive heart failure) ([place]) 01/07/2018  . Benign neoplasm of ascending colon   . Benign neoplasm of transverse colon   . Benign neoplasm of descending colon   . Benign neoplasm of sigmoid colon   . Benign neoplasm of colon   . Vitamin D deficiency 11/02/2017  . Morbid obesity ([place]) 09/28/2017  . Peripheral edema 09/28/2017  . Hypertension 09/28/2017  . Heavy cigarette smoker (20-39 per day) 11/15/2015  . Hyperlipemia 12/28/2008  . 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  Seizure disorder ([place]) 12/28/2008    Past Surgical History:  Procedure Laterality Date  . COLONOSCOPY WITH PROPOFOL N/A 12/14/2017   Procedure: COLONOSCOPY WITH PROPOFOL;  Surgeon: Yetta Flock, MD;  Location: [facility];  Service: Gastroenterology;  Laterality: N/A;  . HAND SURGERY Left    Collapsed vein  . RIGHT/LEFT HEART CATH AND CORONARY ANGIOGRAPHY N/A 01/12/2018   Procedure: RIGHT/LEFT HEART CATH AND CORONARY ANGIOGRAPHY;  Surgeon: Sherren Mocha, MD;  Location: [facility];  Service: Cardiovascular;  Laterality: N/A;        Home Medications    Prior to Admission medications   Medication Sig Start Date End Date Taking? Authorizing Provider  acetaminophen (TYLENOL) 650 MG CR tablet Take 1,300 mg by mouth every 8 (eight) hours as needed for pain.    [provider]  aspirin EC 81 MG EC tablet Take 1 tablet (81 mg total) by mouth daily. 01/13/18   [facility], Tami Lin, PA  carvedilol (COREG) 12.5 MG tablet Take 1.5 tablets (18.75 mg total) by mouth 2 (two) times daily. 08/31/18   Minus Breeding, MD  lisinopril (PRINIVIL,ZESTRIL) 40 MG tablet TAKE 1 TABLET BY MOUTH EVERY DAY 04/19/18   Claretta Fraise, MD  phenytoin (DILANTIN) 100 MG ER capsule TAKE 8 CAPSULES BY MOUTH AT BEDTIME 07/22/18   Claretta Fraise, MD  torsemide (DEMADEX) 20 MG tablet Take 2 tablets (40 mg total) by mouth daily. 07/22/18   Claretta Fraise, MD  Vitamin D, Ergocalciferol, (  DRISDOL) 50000 units CAPS capsule Take 1 capsule (50,000 Units total) by mouth every 7 (seven) days. Patient taking differently: Take 50,000 Units by mouth every Tuesday.  11/02/17   Sharion Balloon, FNP    Family History Family History  Problem Relation Age of Onset  . Heart failure Brother 73  . Cancer Mother 63       Breast.  Died age 83    Social History Social History   Tobacco Use  . Smoking status: Former Smoker    Packs/day: 1.50    Years: 35.00    Pack years: 52.50    Types: Cigarettes  . Smokeless  tobacco: Never Used  . Tobacco comment: Quit March 2019  Substance Use Topics  . Alcohol use: No    Frequency: Never  . Drug use: No     Allergies   Other   Review of Systems Review of Systems  Unable to perform ROS: Intubated     Physical Exam Updated Vital Signs BP (!) 82/41 (BP Location: Left Arm)   Pulse 87   Temp 97.8 F (36.6 C) (Rectal)   Resp 18   SpO2 100%   Physical Exam  Constitutional: He appears well-developed.  HENT:  Head: Normocephalic.  Pupils 5 mm unreactive  Eyes: Conjunctivae and EOM are normal. No scleral icterus.  Neck: Neck supple. No thyromegaly present.  Cardiovascular: Exam reveals no gallop and no friction rub.  No murmur heard. Patient having V. fib and compressions  Pulmonary/Chest: No stridor. He has no wheezes. He has no rales.  Abdominal: He exhibits no distension. There is no tenderness. There is no rebound.  Musculoskeletal: Normal range of motion. He exhibits no edema.  Lymphadenopathy:    He has no cervical adenopathy.  Neurological: He exhibits normal muscle tone. Coordination normal.  She unresponsive  Skin: No rash noted. No erythema.     ED Treatments / Results  Labs (all labs ordered are listed, but only abnormal results are displayed) Labs Reviewed  CBC WITH DIFFERENTIAL/PLATELET - Abnormal; Notable for the following components:      Result Value   WBC 14.8 (*)    RBC 3.67 (*)    Hemoglobin 11.3 (*)    MCV 107.9 (*)    MCHC 28.5 (*)    nRBC 0.3 (*)    All other components within normal limits  COMPREHENSIVE METABOLIC PANEL - Abnormal; Notable for the following components:   CO2 19 (*)    Glucose, Bld 341 (*)    Creatinine, Ser 1.25 (*)    Calcium 8.4 (*)    Albumin 3.4 (*)    Anion gap 17 (*)    All other components within normal limits  BLOOD GAS, ARTERIAL - Abnormal; Notable for the following components:   pH, Arterial 7.226 (*)    pCO2 arterial 48.7 (*)    pO2, Arterial 395 (*)    Bicarbonate 18.3  (*)    Acid-base deficit 6.9 (*)    All other components within normal limits  CBG MONITORING, ED - Abnormal; Notable for the following components:   Glucose-Capillary 178 (*)    All other components within normal limits  I-STAT CHEM 8, ED - Abnormal; Notable for the following components:   Potassium 5.3 (*)    BUN 23 (*)    Glucose, Bld 252 (*)    Calcium, Ion 0.74 (*)    TCO2 19 (*)    Hemoglobin 9.5 (*)    HCT 28.0 (*)  All other components within normal limits  I-STAT CG4 LACTIC ACID, ED - Abnormal; Notable for the following components:   Lactic Acid, Venous 7.87 (*)    All other components within normal limits  I-STAT TROPONIN, ED    EKG None  Radiology No results found.  Procedures Procedures (including critical care time)  Medications Ordered in ED Medications  amiodarone (NEXTERONE PREMIX) 360-4.14 MG/200ML-% (1.8 mg/mL) IV infusion (has no administration in time range)  amiodarone (NEXTERONE PREMIX) 360-4.14 MG/200ML-% (1.8 mg/mL) IV infusion (30 mg/hr Intravenous New Bag/Given 09/24/2018 0744)  EPINEPHrine (ADRENALIN) 1 MG/10ML injection (1 mg Intravenous Given 10/01/2018 0745)  sodium bicarbonate injection 50 mEq (50 mEq Intravenous Given 09/16/2018 0800)     Initial Impression / Assessment and Plan / ED Course  I have reviewed the triage vital signs and the nursing notes.  Pertinent labs & imaging results that were available during my care of the patient were reviewed by me and considered in my medical decision making (see chart for details).    CRITICAL CARE Performed by: Milton Ferguson Total critical care time: 40 minutes Critical care time was exclusive of separately billable procedures and treating other patients. Critical care was necessary to treat or prevent imminent or life-threatening deterioration. Critical care was time spent personally by me on the following activities: development of treatment plan with patient and/or surrogate as well as nursing,  discussions with consultants, evaluation of patient's response to treatment, examination of patient, obtaining history from patient or surrogate, ordering and performing treatments and interventions, ordering and review of laboratory studies, ordering and review of radiographic studies, pulse oximetry and re-evaluation of patient's condition. Patient rested in the waiting room of the x-ray department.  He was last seen normal 5 minutes prior to that arrest.  When I arrived CPR was going on and the patient was in V. fib.  Patient was given 3 epinephrines amiodarone and lidocaine and was shocked 3 times.  He regained a pulse atrial fib.  Patient was intubated by anesthesia.  Patient had amiodarone started as a drip.  Cardiology was called and it was decided to take the patient directly to the Cath.  We spoke with critical care and it was decided since patient was not septic that he could be cool down prior to leaving the emergency department Final Clinical Impressions(s) / ED Diagnoses   Final diagnoses:  None    ED Discharge Orders    None       Milton Ferguson, MD 09/11/2018 [phone]

## 2018-09-19 NOTE — Procedures (Signed)
Central Venous Catheter Insertion Procedure Note Melvin Villarreal [id number] 1961/07/14  Procedure: Insertion of Central Venous Catheter Indications: Assessment of intravascular volume, Drug and/or fluid administration and Frequent blood sampling  Procedure Details Consent: Unable to obtain consent because of emergent medical necessity. Time Out: Verified patient identification, verified procedure, site/side was marked, verified correct patient position, special equipment/implants available, medications/allergies/relevent history reviewed, required imaging and test results available.  Performed  Maximum sterile technique was used including antiseptics, cap, gloves, gown, hand hygiene, mask and sheet. Skin prep: Chlorhexidine; local anesthetic administered A antimicrobial bonded/coated triple lumen catheter was placed in the left internal jugular vein using the Seldinger technique.  U/S used in placement  Evaluation Blood flow good Complications: No apparent complications Patient did tolerate procedure well. Chest X-ray ordered to verify placement.  CXR: pending.  Melvin Villarreal 09/16/2018, 11:54 AM

## 2018-09-20 ENCOUNTER — Inpatient Hospital Stay (HOSPITAL_COMMUNITY): Payer: 59

## 2018-09-20 DIAGNOSIS — I361 Nonrheumatic tricuspid (valve) insufficiency: Secondary | ICD-10-CM

## 2018-09-20 LAB — COMPREHENSIVE METABOLIC PANEL
ALK PHOS: 87 U/L (ref 38–126)
ALT: 52 U/L — ABNORMAL HIGH (ref 0–44)
AST: 66 U/L — ABNORMAL HIGH (ref 15–41)
Albumin: 2.8 g/dL — ABNORMAL LOW (ref 3.5–5.0)
Anion gap: 13 (ref 5–15)
BUN: 20 mg/dL (ref 6–20)
CO2: 27 mmol/L (ref 22–32)
Calcium: 8.4 mg/dL — ABNORMAL LOW (ref 8.9–10.3)
Chloride: 102 mmol/L (ref 98–111)
Creatinine, Ser: 1.42 mg/dL — ABNORMAL HIGH (ref 0.61–1.24)
GFR calc Af Amer: >60 mL/min (ref >60)
GFR, EST NON AFRICAN AMERICAN: 55 mL/min — AB (ref >60)
Glucose, Bld: 136 mg/dL — ABNORMAL HIGH (ref 70–99)
Potassium: 3.8 mmol/L (ref 3.5–5.1)
Sodium: 142 mmol/L (ref 135–145)
Total Bilirubin: 0.9 mg/dL (ref 0.3–1.2)
Total Protein: 6.2 g/dL — ABNORMAL LOW (ref 6.5–8.1)

## 2018-09-20 LAB — BLOOD GAS, ARTERIAL
Acid-Base Excess: 4.6 mmol/L — ABNORMAL HIGH (ref 0.0–2.0)
Bicarbonate: 28.8 mmol/L — ABNORMAL HIGH (ref 20.0–28.0)
Drawn by: 35043
FIO2: 80
LHR: 22 {breaths}/min
MECHVT: 680 mL
O2 SAT: 98.7 %
PEEP: 5 cmH2O
Patient temperature: 99.5
pCO2 arterial: 45.3 mmHg (ref 32.0–48.0)
pH, Arterial: 7.422 (ref 7.350–7.450)
pO2, Arterial: 123 mmHg — ABNORMAL HIGH (ref 83.0–108.0)

## 2018-09-20 LAB — CBC
HCT: 32 % — ABNORMAL LOW (ref 39.0–52.0)
Hemoglobin: 9.5 g/dL — ABNORMAL LOW (ref 13.0–17.0)
MCH: 29.9 pg (ref 26.0–34.0)
MCHC: 29.7 g/dL — ABNORMAL LOW (ref 30.0–36.0)
MCV: 100.6 fL — ABNORMAL HIGH (ref 80.0–100.0)
Platelets: 218 10*3/uL (ref 150–400)
RBC: 3.18 MIL/uL — ABNORMAL LOW (ref 4.22–5.81)
RDW: 12.5 % (ref 11.5–15.5)
WBC: 18.2 10*3/uL — AB (ref 4.0–10.5)
nRBC: 0 % (ref 0.0–0.2)

## 2018-09-20 LAB — HIV ANTIBODY (ROUTINE TESTING W REFLEX): HIV Screen 4th Generation wRfx: NONREACTIVE

## 2018-09-20 LAB — MAGNESIUM
Magnesium: 2.1 mg/dL (ref 1.7–2.4)
Magnesium: 2.1 mg/dL (ref 1.7–2.4)

## 2018-09-20 LAB — HEPATITIS PANEL, ACUTE
HCV Ab: <0.1 s/co ratio (ref 0.0–0.9)
HEP B S AG: NEGATIVE
Hep A IgM: NEGATIVE
Hep B C IgM: NEGATIVE

## 2018-09-20 LAB — GLUCOSE, CAPILLARY
GLUCOSE-CAPILLARY: 108 mg/dL — AB (ref 70–99)
Glucose-Capillary: 142 mg/dL — ABNORMAL HIGH (ref 70–99)
Glucose-Capillary: 129 mg/dL — ABNORMAL HIGH (ref 70–99)
Glucose-Capillary: 108 mg/dL — ABNORMAL HIGH (ref 70–99)
Glucose-Capillary: 130 mg/dL — ABNORMAL HIGH (ref 70–99)

## 2018-09-20 LAB — APTT: aPTT: 32 seconds (ref 24–36)

## 2018-09-20 LAB — PROTIME-INR
INR: 1.18
Prothrombin Time: 14.9 seconds (ref 11.4–15.2)

## 2018-09-20 LAB — ECHOCARDIOGRAM COMPLETE: Weight: 6271.65 oz

## 2018-09-20 LAB — URINE CULTURE: Culture: NO GROWTH

## 2018-09-20 LAB — PHOSPHORUS
Phosphorus: 3.3 mg/dL (ref 2.5–4.6)
Phosphorus: 2.9 mg/dL (ref 2.5–4.6)

## 2018-09-20 LAB — HEPATITIS B SURFACE ANTIGEN: Hepatitis B Surface Ag: NEGATIVE

## 2018-09-20 LAB — MRSA PCR SCREENING: MRSA by PCR: NEGATIVE

## 2018-09-20 MED ORDER — VALPROATE SODIUM 500 MG/5ML IV SOLN
2000.0000 mg | Freq: Once | INTRAVENOUS | Status: AC
  Administered 2018-09-20: 2000 mg via INTRAVENOUS
  Filled 2018-09-20: qty 20

## 2018-09-20 MED ORDER — VITAL HIGH PROTEIN PO LIQD: 1000.0000 mL | ORAL | Status: DC

## 2018-09-20 MED ORDER — VALPROATE SODIUM 500 MG/5ML IV SOLN
600.0000 mg | Freq: Three times a day (TID) | INTRAVENOUS | Status: DC
  Administered 2018-09-20 – 2018-09-21 (×3): 600 mg via INTRAVENOUS
  Filled 2018-09-20 (×5): qty 6

## 2018-09-20 MED ORDER — FUROSEMIDE 10 MG/ML IJ SOLN
40.0000 mg | Freq: Once | INTRAMUSCULAR | Status: AC
  Administered 2018-09-20: 40 mg via INTRAVENOUS
  Filled 2018-09-20: qty 4

## 2018-09-20 MED ORDER — POTASSIUM CHLORIDE 20 MEQ/15ML (10%) PO SOLN
40.0000 meq | Freq: Once | ORAL | Status: AC
  Administered 2018-09-20: 40 meq
  Filled 2018-09-20: qty 30

## 2018-09-20 MED ORDER — PRO-STAT SUGAR FREE PO LIQD
30.0000 mL | Freq: Two times a day (BID) | ORAL | Status: DC
  Administered 2018-09-20: 30 mL
  Filled 2018-09-20: qty 30

## 2018-09-20 MED ORDER — PRO-STAT SUGAR FREE PO LIQD: 60.0000 mL | Freq: Every day | ORAL | Status: DC

## 2018-09-20 MED FILL — Nitroglycerin IV Soln 100 MCG/ML in D5W: INTRA_ARTERIAL | Qty: 10 | Status: AC

## 2018-09-20 NOTE — Progress Notes (Signed)
Subjective: Patient sedated with 50 mcg/kg/min of propofol. Per RN, myoclonus recurs when propofol is lowered, but it has not been held since the valproic acid was loaded.   Objective: Current vital signs: BP 124/64   Pulse 80   Temp 99.5 F (37.5 C)   Resp (!) 22   Wt (!) 177.8 kg   SpO2 99%   BMI 48.35 kg/m  Vital signs in last 24 hours: Temp:  [99 F (37.2 C)-101.7 F (38.7 C)] 99.5 F (37.5 C) (12/10 0737) Pulse Rate:  [74-87] 80 (12/10 0737) Resp:  [0-33] 22 (12/10 0737) BP: (83-130)/(40-79) 124/64 (12/10 0700) SpO2:  [94 %-100 %] 99 % (12/10 0737) FiO2 (%):  [70 %-100 %] 70 % (12/10 0737) Weight:  [177.8 kg] 177.8 kg (12/09 1200)  Intake/Output from previous day: 12/09 0701 - 12/10 0700 In: 2668.9 [I.V.:2169; IV Piggyback:499.9] Out: 1470 [Urine:1470] Intake/Output this shift: No intake/output data recorded. Nutritional status:  Diet Order            Diet NPO time specified  Diet effective now             General: Morbidly obese HEENT: Sedated and intubated  Neurologic Exam: Sedated on propofol Ment: On sedation there is no response to any external stimuli.  CN: Pupils equal. No blink to threat. No doll's eye reflex. (sedated on propofol) Motor: Flaccid tone x 4 (sedated on propofol) Sensory: No response to any stimuli (sedated on propofol)  Lab Results: Results for orders placed or performed during the hospital encounter of 09/23/2018 (from the past 48 hour(s))  CBG monitoring, ED     Status: Abnormal   Collection Time: 10/03/2018  7:37 AM  Result Value Ref Range   Glucose-Capillary 178 (H) 70 - 99 mg/dL  I-stat chem 8, ed     Status: Abnormal   Collection Time: 09/26/2018  7:40 AM  Result Value Ref Range   Sodium 137 135 - 145 mmol/L   Potassium 5.3 (H) 3.5 - 5.1 mmol/L   Chloride 111 98 - 111 mmol/L   BUN 23 (H) 6 - 20 mg/dL   Creatinine, Ser 0.80 0.61 - 1.24 mg/dL   Glucose, Bld 252 (H) 70 - 99 mg/dL   Calcium, Ion 0.74 (LL) 1.15 - 1.40 mmol/L    TCO2 19 (L) 22 - 32 mmol/L   Hemoglobin 9.5 (L) 13.0 - 17.0 g/dL   HCT 28.0 (L) 39.0 - 52.0 %   Comment NOTIFIED PHYSICIAN   I-stat troponin, ED     Status: None   Collection Time: 09/13/2018  7:50 AM  Result Value Ref Range   Troponin i, poc 0.06 0.00 - 0.08 ng/mL   Comment 3            Comment: Due to the release kinetics of cTnI, a negative result within the first hours of the onset of symptoms does not rule out myocardial infarction with certainty. If myocardial infarction is still suspected, repeat the test at appropriate intervals.   CBC with Differential/Platelet     Status: Abnormal   Collection Time: 09/14/2018  7:51 AM  Result Value Ref Range   WBC 14.8 (H) 4.0 - 10.5 K/uL   RBC 3.67 (L) 4.22 - 5.81 MIL/uL   Hemoglobin 11.3 (L) 13.0 - 17.0 g/dL   HCT 39.6 39.0 - 52.0 %   MCV 107.9 (H) 80.0 - 100.0 fL   MCH 30.8 26.0 - 34.0 pg   MCHC 28.5 (L) 30.0 - 36.0 g/dL   RDW  12.5 11.5 - 15.5 %   Platelets 237 150 - 400 K/uL   nRBC 0.3 (H) 0.0 - 0.2 %   Neutrophils Relative % 54 %   Neutro Abs 8.3 (H) 1.7 - 7.7 K/uL   Lymphocytes Relative 31 %   Lymphs Abs 4.5 (H) 0.7 - 4.0 K/uL   Monocytes Relative 5 %   Monocytes Absolute 0.7 0.1 - 1.0 K/uL   Eosinophils Relative 1 %   Eosinophils Absolute 0.1 0.0 - 0.5 K/uL   Basophils Relative 1 %   Basophils Absolute 0.1 0.0 - 0.1 K/uL   WBC Morphology MILD LEFT SHIFT (1-5% METAS, OCC MYELO, OCC BANDS)    Immature Granulocytes 8 %   Abs Immature Granulocytes 1.14 (H) 0.00 - 0.07 K/uL    Comment: Performed at [facility], [address] [place], [place] [address]  Comprehensive metabolic panel     Status: Abnormal   Collection Time: 10/11/2018  7:51 AM  Result Value Ref Range   Sodium 137 135 - 145 mmol/L   Potassium 3.6 3.5 - 5.1 mmol/L    Comment: DELTA CHECK NOTED   Chloride 101 98 - 111 mmol/L   CO2 19 (L) 22 - 32 mmol/L   Glucose, Bld 341 (H) 70 - 99 mg/dL   BUN 20 6 - 20 mg/dL   Creatinine, Ser 1.25 (H) 0.61 - 1.24 mg/dL    Calcium 8.4 (L) 8.9 - 10.3 mg/dL   Total Protein 7.4 6.5 - 8.1 g/dL   Albumin 3.4 (L) 3.5 - 5.0 g/dL   AST 36 15 - 41 U/L   ALT 26 0 - 44 U/L   Alkaline Phosphatase 110 38 - 126 U/L   Total Bilirubin 0.7 0.3 - 1.2 mg/dL   GFR calc non Af Amer >60 >60 mL/min   GFR calc Af Amer >60 >60 mL/min   Anion gap 17 (H) 5 - 15    Comment: Performed at [facility] [address] [place], [place] [address]  I-Stat CG4 Lactic Acid, ED     Status: Abnormal   Collection Time: 10/01/2018  7:54 AM  Result Value Ref Range   Lactic Acid, Venous 7.87 (HH) 0.5 - 1.9 mmol/L   Comment NOTIFIED PHYSICIAN   Blood gas, arterial     Status: Abnormal   Collection Time: 09/17/2018  8:00 AM  Result Value Ref Range   FIO2 100.00    Delivery systems VENTILATOR    Mode PRESSURE REGULATED VOLUME CONTROL    VT 680 mL   LHR 24 resp/min   Peep/cpap 5.0 cm H20   pH, Arterial 7.226 (L) 7.350 - 7.450   pCO2 arterial 48.7 (H) 32.0 - 48.0 mmHg   pO2, Arterial 395 (H) 83.0 - 108.0 mmHg   Bicarbonate 18.3 (L) 20.0 - 28.0 mmol/L   Acid-base deficit 6.9 (H) 0.0 - 2.0 mmol/L   O2 Saturation 99.6 %   Collection site LEFT RADIAL    Drawn by [id number]    Sample type ARTERIAL    Allens test (pass/fail) PASS PASS    Comment: Performed at [facility], [address], [place], [place] [address]  I-STAT 3, arterial blood gas (G3+)     Status: Abnormal   Collection Time: 10/03/2018 10:51 AM  Result Value Ref Range   pH, Arterial 7.272 (L) 7.350 - 7.450   pCO2 arterial 66.1 (HH) 32.0 - 48.0 mmHg   pO2, Arterial 261.0 (H) 83.0 - 108.0 mmHg   Bicarbonate 30.5 (H) 20.0 - 28.0 mmol/L  TCO2 32 22 - 32 mmol/L   O2 Saturation 100.0 %   Acid-Base Excess 2.0 0.0 - 2.0 mmol/L   Patient temperature HIDE    Sample type ARTERIAL    Comment NOTIFIED PHYSICIAN   Phenytoin level, total     Status: Abnormal   Collection Time: 09/24/2018 11:41 AM  Result Value Ref Range   Phenytoin Lvl 8.8 (L) 10.0 - 20.0 ug/mL    Comment: Performed at [facility] [address], [place], [place] [address]  CBC     Status: Abnormal   Collection Time: 09/17/2018 11:41 AM  Result Value Ref Range   WBC 21.7 (H) 4.0 - 10.5 K/uL   RBC 3.60 (L) 4.22 - 5.81 MIL/uL   Hemoglobin 10.8 (L) 13.0 - 17.0 g/dL   HCT 36.2 (L) 39.0 - 52.0 %   MCV 100.6 (H) 80.0 - 100.0 fL   MCH 30.0 26.0 - 34.0 pg   MCHC 29.8 (L) 30.0 - 36.0 g/dL   RDW 12.6 11.5 - 15.5 %   Platelets 273 150 - 400 K/uL   nRBC 0.0 0.0 - 0.2 %    Comment: Performed at [facility], [place] [address] [place], [place] [address]  Creatinine, serum     Status: Abnormal   Collection Time: 10/11/2018 11:41 AM  Result Value Ref Range   Creatinine, Ser 1.32 (H) 0.61 - 1.24 mg/dL   GFR calc non Af Amer >60 >60 mL/min   GFR calc Af Amer >60 >60 mL/min    Comment: Performed at [facility] [address] [place], [place] [address]  Hemoglobin A1c     Status: None   Collection Time: 10/06/2018 11:41 AM  Result Value Ref Range   Hgb A1c MFr Bld 5.6 4.8 - 5.6 %    Comment: (NOTE) Pre diabetes:          5.7%-6.4% Diabetes:              >6.4% Glycemic control for   <7.0% adults with diabetes    Mean Plasma Glucose 114.02 mg/dL    Comment: Performed at [facility] [address] [place], [place] [address]  Phosphorus     Status: Abnormal   Collection Time: 09/17/2018 11:41 AM  Result Value Ref Range   Phosphorus 4.9 (H) 2.5 - 4.6 mg/dL    Comment: Performed at [facility] [address], [place], [place] [address]  Magnesium     Status: None   Collection Time: 09/11/2018 11:41 AM  Result Value Ref Range   Magnesium 2.0 1.7 - 2.4 mg/dL    Comment: Performed at [facility] [address] [place], [place] [address]  Brain natriuretic peptide     Status: Abnormal   Collection Time: 09/17/2018 11:41 AM  Result Value Ref Range   B Natriuretic Peptide 192.9 (H) 0.0 - 100.0 pg/mL    Comment: Performed at [facility] [address] [place], [place] [address]   Culture, respiratory     Status: None (Preliminary result)   Collection Time: 09/18/2018 12:27 PM  Result Value Ref Range   Specimen Description TRACHEAL ASPIRATE    Special Requests NONE    Gram Stain      RARE WBC PRESENT,BOTH PMN AND MONONUCLEAR FEW GRAM POSITIVE COCCI IN PAIRS IN CHAINS RARE GRAM POSITIVE RODS    Culture      CULTURE REINCUBATED FOR BETTER GROWTH Performed at [facility], [place] [address], [place], [place] [address]    Report Status PENDING   Triglycerides     Status: None   Collection Time: 09/25/2018 12:49 PM  Result Value Ref Range   Triglycerides 110 <150 mg/dL    Comment: Performed at [facility], [place] [address] [place], [place] [address]  HIV Antibody (routine testing w rflx)     Status: None   Collection Time: 10/10/2018 12:49 PM  Result Value Ref Range   HIV Screen 4th Generation wRfx Non Reactive Non Reactive    Comment: (NOTE) Performed At: [facility] [place], [place] [id number] [name] MD [id number]   Hepatitis panel, acute     Status: None   Collection Time: 09/18/2018 12:49 PM  Result Value Ref Range   Hepatitis B Surface Ag Negative Negative   HCV Ab <0.1 0.0 - 0.9 s/co ratio    Comment: (NOTE)                                  Negative:     < 0.8                             Indeterminate: 0.8 - 0.9                                  Positive:     > 0.9 The CDC recommends that a positive HCV antibody result be followed up with a HCV Nucleic Acid Amplification test (920100). Performed At: [facility] [place], [place] [id number] [name] MD [id number]    Hep A IgM Negative Negative   Hep B C IgM Negative Negative  Hepatitis B surface antigen     Status: None   Collection Time: 09/14/2018 12:49 PM  Result Value Ref Range   Hepatitis B Surface Ag Negative Negative    Comment: (NOTE) Performed At: [facility] [place], [place]  [id number] [name] MD [id number]   Urinalysis, Routine w reflex microscopic     Status: Abnormal   Collection Time: 10/11/2018  1:08 PM  Result Value Ref Range   Color, Urine YELLOW YELLOW   APPearance CLOUDY (A) CLEAR   Specific Gravity, Urine 1.025 1.005 - 1.030   pH 5.5 5.0 - 8.0   Glucose, UA 100 (A) NEGATIVE mg/dL   Hgb urine dipstick LARGE (A) NEGATIVE   Bilirubin Urine NEGATIVE NEGATIVE   Ketones, ur NEGATIVE NEGATIVE mg/dL   Protein, ur >300 (A) NEGATIVE mg/dL   Nitrite NEGATIVE NEGATIVE   Leukocytes, UA NEGATIVE NEGATIVE    Comment: Performed at [facility] [address] [place], [place] [address]  Urinalysis, Microscopic (reflex)     Status: Abnormal   Collection Time: 10/02/2018  1:08 PM  Result Value Ref Range   RBC / HPF 21-50 0 - 5 RBC/hpf   WBC, UA 0-5 0 - 5 WBC/hpf   Bacteria, UA FEW (A) NONE SEEN   Squamous Epithelial / LPF 0-5 0 - 5   Granular Casts, UA PRESENT    Urine-Other AMORPHOUS URATES/PHOSPHATES     Comment: Performed at [facility], [place] [address], [place], [place] [address]  I-STAT 3, arterial blood gas (G3+)     Status: Abnormal  Collection Time: 09/20/2018  2:15 PM  Result Value Ref Range   pH, Arterial 7.388 7.350 - 7.450   pCO2 arterial 51.0 (H) 32.0 - 48.0 mmHg   pO2, Arterial 374.0 (H) 83.0 - 108.0 mmHg   Bicarbonate 30.7 (H) 20.0 - 28.0 mmol/L   TCO2 32 22 - 32 mmol/L   O2 Saturation 100.0 %   Acid-Base Excess 5.0 (H) 0.0 - 2.0 mmol/L   Patient temperature 98.6 F    Collection site RADIAL, ALLEN'S TEST ACCEPTABLE    Drawn by Operator    Sample type ARTERIAL   Glucose, capillary     Status: Abnormal   Collection Time: 09/25/2018  4:56 PM  Result Value Ref Range   Glucose-Capillary 152 (H) 70 - 99 mg/dL  Glucose, capillary     Status: Abnormal   Collection Time: 09/30/2018  8:58 PM  Result Value Ref Range   Glucose-Capillary 128 (H) 70 - 99 mg/dL  Glucose, capillary     Status: Abnormal   Collection Time: 09/20/18   1:16 AM  Result Value Ref Range   Glucose-Capillary 108 (H) 70 - 99 mg/dL  MRSA PCR Screening     Status: None   Collection Time: 09/20/18  4:20 AM  Result Value Ref Range   MRSA by PCR NEGATIVE NEGATIVE    Comment:        The GeneXpert MRSA Assay (FDA approved for NASAL specimens only), is one component of a comprehensive MRSA colonization surveillance program. It is not intended to diagnose MRSA infection nor to guide or monitor treatment for MRSA infections. Performed at [facility], [place] [address] [place], [place] [address]   Comprehensive metabolic panel     Status: Abnormal   Collection Time: 09/20/18  4:28 AM  Result Value Ref Range   Sodium 142 135 - 145 mmol/L   Potassium 3.8 3.5 - 5.1 mmol/L   Chloride 102 98 - 111 mmol/L   CO2 27 22 - 32 mmol/L   Glucose, Bld 136 (H) 70 - 99 mg/dL   BUN 20 6 - 20 mg/dL   Creatinine, Ser 1.42 (H) 0.61 - 1.24 mg/dL   Calcium 8.4 (L) 8.9 - 10.3 mg/dL   Total Protein 6.2 (L) 6.5 - 8.1 g/dL   Albumin 2.8 (L) 3.5 - 5.0 g/dL   AST 66 (H) 15 - 41 U/L   ALT 52 (H) 0 - 44 U/L   Alkaline Phosphatase 87 38 - 126 U/L   Total Bilirubin 0.9 0.3 - 1.2 mg/dL   GFR calc non Af Amer 55 (L) >60 mL/min   GFR calc Af Amer >60 >60 mL/min   Anion gap 13 5 - 15    Comment: Performed at [facility], [place] [address], [place], [place] [address]  CBC     Status: Abnormal   Collection Time: 09/20/18  4:28 AM  Result Value Ref Range   WBC 18.2 (H) 4.0 - 10.5 K/uL   RBC 3.18 (L) 4.22 - 5.81 MIL/uL   Hemoglobin 9.5 (L) 13.0 - 17.0 g/dL   HCT 32.0 (L) 39.0 - 52.0 %   MCV 100.6 (H) 80.0 - 100.0 fL   MCH 29.9 26.0 - 34.0 pg   MCHC 29.7 (L) 30.0 - 36.0 g/dL   RDW 12.5 11.5 - 15.5 %   Platelets 218 150 - 400 K/uL   nRBC 0.0 0.0 - 0.2 %    Comment: Performed at [facility], [place],  [place] [address]  Protime-INR     Status: None   Collection Time: 09/20/18  4:28 AM  Result Value Ref Range   Prothrombin Time 14.9 11.4  - 15.2 seconds   INR 1.18     Comment: Performed at [facility] [address], [place], [place] [address]  APTT     Status: None   Collection Time: 09/20/18  4:28 AM  Result Value Ref Range   aPTT 32 24 - 36 seconds    Comment: Performed at [facility] [address], [place], [place] [address]  Magnesium     Status: None   Collection Time: 09/20/18  4:28 AM  Result Value Ref Range   Magnesium 2.1 1.7 - 2.4 mg/dL    Comment: Performed at [facility] [address] [place], [place] [address]  Phosphorus     Status: None   Collection Time: 09/20/18  4:28 AM  Result Value Ref Range   Phosphorus 3.3 2.5 - 4.6 mg/dL    Comment: Performed at [facility] [address], [place], [place] [address]  Glucose, capillary     Status: Abnormal   Collection Time: 09/20/18  4:40 AM  Result Value Ref Range   Glucose-Capillary 142 (H) 70 - 99 mg/dL  Blood gas, arterial     Status: Abnormal   Collection Time: 09/20/18  6:20 AM  Result Value Ref Range   FIO2 80.00    Delivery systems VENTILATOR    Mode PRESSURE REGULATED VOLUME CONTROL    VT 680 mL   LHR 22 resp/min   Peep/cpap 5.0 cm H20   pH, Arterial 7.422 7.350 - 7.450   pCO2 arterial 45.3 32.0 - 48.0 mmHg   pO2, Arterial 123 (H) 83.0 - 108.0 mmHg   Bicarbonate 28.8 (H) 20.0 - 28.0 mmol/L   Acid-Base Excess 4.6 (H) 0.0 - 2.0 mmol/L   O2 Saturation 98.7 %   Patient temperature 99.5    Collection site LEFT RADIAL    Drawn by [id number]    Allens test (pass/fail) PASS PASS  Glucose, capillary     Status: Abnormal   Collection Time: 09/20/18  8:30 AM  Result Value Ref Range   Glucose-Capillary 129 (H) 70 - 99 mg/dL    Recent Results (from the past 240 hour(s))  Culture, respiratory     Status: None (Preliminary result)   Collection Time: 10/07/2018 12:27 PM  Result Value Ref Range Status   Specimen Description TRACHEAL ASPIRATE  Final   Special Requests NONE  Final   Gram Stain   Final    RARE WBC  PRESENT,BOTH PMN AND MONONUCLEAR FEW GRAM POSITIVE COCCI IN PAIRS IN CHAINS RARE GRAM POSITIVE RODS    Culture   Final    CULTURE REINCUBATED FOR BETTER GROWTH Performed at [facility], [address], [place], [place] [address]    Report Status PENDING  Incomplete  MRSA PCR Screening     Status: None   Collection Time: 09/20/18  4:20 AM  Result Value Ref Range Status   MRSA by PCR NEGATIVE NEGATIVE Final    Comment:        The GeneXpert MRSA Assay (FDA approved for NASAL specimens only), is one component of a comprehensive MRSA colonization surveillance program. It is not intended to diagnose MRSA infection nor to guide or monitor treatment for MRSA infections. Performed at [facility], [place] [address], [place],  [address]  Lipid Panel Recent Labs    09/15/2018 1249  TRIG 110    Studies/Results: Ct Head Wo Contrast  Result Date: 09/20/2018 CLINICAL DATA:  Altered mental status. EXAM: CT HEAD WITHOUT CONTRAST TECHNIQUE: Contiguous axial images were obtained from the base of the skull through the vertex without intravenous contrast. COMPARISON:  None. FINDINGS: Brain: Small rounded area of low density in the white matter adjacent to the frontal horn of the left lateral ventricle. Otherwise, normal appearing cerebral hemispheres and posterior fossa structures. Normal size and position of the ventricles. No intracranial hemorrhage, mass lesion or CT evidence of acute infarction. Vascular: No hyperdense vessel or unexpected calcification. Skull: Normal. Negative for fracture or focal lesion. Sinuses/Orbits: Unremarkable. Other: None. IMPRESSION: 1. No acute abnormality. 2. 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 Electronically Signed   By: Claudie Revering M.D.   On: 09/20/2018 02:24   Portable Chest 1 View  Result Date: 09/20/2018 CLINICAL DATA:  Status post cardiac arrest, acute respiratory failure, intubated  patient. EXAM: PORTABLE CHEST 1 VIEW COMPARISON:  Portable chest x-ray of September 19, 2018 FINDINGS: The lungs are borderline hypoinflated. The interstitial markings remain increased bilaterally. Confluent density in the right mid to lower lung is stable. The cardiac silhouette remains enlarged. The pulmonary vascularity remains engorged. The endotracheal tube tip projects between the clavicular heads approximately 7.5 cm above the carina. The esophagogastric tube tip and proximal port project below the GE junction. The left internal jugular venous catheter tip projects at the junction of the right and left brachiocephalic veins. IMPRESSION: Borderline hypoinflation. Persistent pulmonary interstitial edema. Persistent atelectasis in the right mid to lower lung. A stable cardiomegaly and pulmonary vascular congestion. The support tubes are in reasonable position. Electronically Signed   By: David  Martinique M.D.   On: 09/20/2018 09:05   Dg Chest Port 1 View  Result Date: 09/12/2018 CLINICAL DATA:  Central line placement, intubation EXAM: PORTABLE CHEST 1 VIEW COMPARISON:  Portable exam 1222 hours compared to 0804 hours FINDINGS: Tip of endotracheal tube projects 6.6 cm above carina. LEFT jugular central venous catheter tip projects over SVC. Enlargement of cardiac silhouette with vascular congestion. Interstitial infiltrates question pulmonary edema, asymmetrically greater on LEFT. Subsegmental atelectasis at minor fissure. No pleural effusion or pneumothorax. IMPRESSION: Line and tube positions as above. Enlargement of cardiac silhouette with pulmonary vascular congestion and probable asymmetric pulmonary edema. Subsegmental atelectasis RIGHT base. Electronically Signed   By: Lavonia Dana M.D.   On: 10/07/2018 12:43   Dg Chest Portable 1 View  Result Date: 09/27/2018 CLINICAL DATA:  Shortness of breath EXAM: PORTABLE CHEST 1 VIEW COMPARISON:  12/22/2017 FINDINGS: Endotracheal tube is 7.5 cm above the carina.  Cardiomegaly with vascular congestion. Mild interstitial prominence could reflect interstitial edema. No visible effusions or acute bony abnormality. IMPRESSION: Endotracheal tube 7.5 cm above the carina. Cardiomegaly with vascular congestion and probable early interstitial edema. Electronically Signed   By: Rolm Baptise M.D.   On: 10/07/2018 08:36   Dg Abd Portable 1v  Result Date: 10/11/2018 CLINICAL DATA:  OG tube placement EXAM: PORTABLE ABDOMEN - 1 VIEW COMPARISON:  09/27/2018 FINDINGS: Esophageal tube tip in the region of GE junction, side-port projects over distal esophagus. Airspace disease at the left base. IMPRESSION: Esophageal tube side port projects over distal esophagus, suggest further advancement for more optimal positioning. Electronically Signed   By: Donavan Foil M.D.   On: 09/30/2018 23:04   Dg Abd Portable 1v  Result Date:  10/02/2018 CLINICAL DATA:  Orogastric tube placement. EXAM: PORTABLE ABDOMEN - 1 VIEW COMPARISON:  09/22/2018. FINDINGS: Orogastric tube tip in the proximal stomach. The side hole is in the distal esophagus, not included on the image. Stable normal bowel gas pattern. Lumbar and lower thoracic spine degenerative changes and mild scoliosis. IMPRESSION: 1. Orogastric tube tip in the proximal stomach with the side hole in the distal esophagus, not included on the image. 2. No acute abnormality. Electronically Signed   By: Claudie Revering M.D.   On: 10/09/2018 22:58   Dg Abd Portable 1v  Result Date: 09/18/2018 CLINICAL DATA:  Confirm orogastric tube placement. EXAM: PORTABLE ABDOMEN - 1 VIEW COMPARISON:  None. FINDINGS: Habitus limited examination. Nasogastric tube tip projects at [employer] junction. Included bowel gas pattern is nondilated and nonobstructive. No intra-abdominal mass effect or pathologic calcifications. Osseous structures are nonacute, mild lower lumbar levoscoliosis. IMPRESSION: 1. Habitus limited examination. 2. Nasogastric tube tip projects at [employer] junction.  Electronically Signed   By: Elon Alas M.D.   On: 09/25/2018 22:00    Medications:  Scheduled: . aspirin  300 mg Rectal Daily  . chlorhexidine gluconate (MEDLINE KIT)  15 mL Mouth Rinse BID  . enoxaparin (LOVENOX) injection  40 mg Subcutaneous Q24H  . feeding supplement (PRO-STAT SUGAR FREE 64)  30 mL Per Tube BID  . feeding supplement (VITAL HIGH PROTEIN)  1,000 mL Per Tube Q24H  . folic acid  1 mg Intravenous Daily  . insulin aspart  0-15 Units Subcutaneous Q4H  . mouth rinse  15 mL Mouth Rinse 10 times per day  . phenytoin  400 mg Per Tube BID  . sodium chloride flush  3 mL Intravenous Q12H  . thiamine injection  100 mg Intravenous Daily   Continuous: . sodium chloride    . sodium chloride 50 mL/hr at 09/20/18 0900  . amiodarone 30 mg/hr (09/20/18 0900)  . ampicillin-sulbactam (UNASYN) IV Stopped (09/20/18 0507)  . levETIRAcetam Stopped (09/20/18 0840)  . norepinephrine (LEVOPHED) Adult infusion Stopped (09/20/18 0205)  . propofol (DIPRIVAN) infusion 50 mcg/kg/min (09/20/18 1017)  . valproate sodium      EEG report: This is an abnormal electroencephalogram secondary to a burst suppression rhythm where the bursts coincide with clinical generalized myoclonic activity therefore suggesting an ictal etiology.    Assessment: 57 year old male with multiple cardiac issues, presenting following cardiac arrest, with exam findings that are most consistent with diffuse anoxic brain injury.   1. It is estimated that the patient was down for a total of 45 minutes prior to ROSC.   2. Currently with myoclonic activity controlled with propofol at a rate of 50 mcg/kg/min. Also on Dilantin and Keppra, with valproic acid loaded this morning.  3. CT head obtained overnight revealed no acute abnormality. A small, old left frontal white matter lacunar infarct or prominent perivascular space is noted. 4. EEG performed yesterday afternoon showed burst suppression with generalized myoclonic  activity occurring during the bursts.  5. On propofol, exam is uninformative. He will need to remain on propofol for MRI. 6. Per RN, myoclonus occurred while on Versed. Propofol may therefore be the best option for managing myoclonus unless the addition of valproic acid to his regimen proves effective.   Recommendations: - He is being transported to MRI at this time   - Continue Keppra at 1000 mg IV twice daily - Continue patient's Dilantin via tube -- Loaded with valproic acid this AM. Continue at 10 mg/kg daily, divided into three daily doses.  --  Will need repeat Dilantin level and valproic acid level tomorrow AM (ordered) -- Prognosis likely to be poor given myoclonus this soon after cardiac arrest. However, will need repeat exam 72 hours after event off all sedation for more accurate prognostication.   35 minutes spent in the neurological evaluation and management of this critically ill patient with anoxic brain injury.    LOS: 1 day   _0  signed: Dr. Kerney Elbe 09/20/2018  9:26 AM

## 2018-09-20 NOTE — Progress Notes (Signed)
RT Note:  Transported patient to and from CT without any difficulties.  Suctioned patient before leaving and after returning.  Vital signs stable throughout.

## 2018-09-20 NOTE — Progress Notes (Signed)
Transport pt to MRI and back to 2 heart. No noted respiratory issues.

## 2018-09-20 NOTE — Plan of Care (Signed)
  Problem: Elimination: Goal: Will not experience complications related to urinary retention Outcome: Progressing   Problem: Safety: Goal: Ability to remain free from injury will improve Outcome: Progressing   Problem: Skin Integrity: Goal: Risk for impaired skin integrity will decrease Outcome: Progressing   

## 2018-09-20 NOTE — Procedures (Signed)
NGT Placement By MD  RN unable to place NGT, kinked on x-ray, unable to push liquid through, NGT pulled back and replaced and verified by auscultation.  Will order a CXR and AXR.  [name], M.D. [facility] Pulmonary/Critical Care Medicine. Pager: [phone]. After hours pager: [phone]

## 2018-09-20 NOTE — Progress Notes (Signed)
   Digital images reviewed.  RCA is totally occluded with collaterals.  Prolonged downtime, greater than 30 minutes.  Suspect significant neurological injury.  He is hemodynamically stable.  No indication at this time for coronary intervention unless dramatic neurological recovery.  Please call if we may be of further assistance. [facility] will sign off.   Medication Recommendations:  None Other recommendations (labs, testing, etc):  Call if we are needed.  Follow up as an outpatient:  N/A

## 2018-09-20 NOTE — Progress Notes (Signed)
MRI results discussed w/ family. They all understand that this represents a catastrophic neurological insult and the chance of meaningful neurological recovery is unlikely at best. Based on the patients previously shared values the patient's father, uncle and brother all agree on DNR status w/ no escalation. They also do not want trach/PEG/prolonged care in SNF. They unanimously agree on removal of ETT and palliative focus but need time to get the rest of the family  PLAN Full DNR No escalation Cont current meds to control myoclonus Anticipate one-way extubation in next 24-48 hours  [name] ACNP-BC [place] Pager # [phone] OR # [phone] if no answer

## 2018-09-20 NOTE — Progress Notes (Addendum)
NAME:  Melvin Villarreal, MRN:  [id number], DOB:  26-Jul-1961, LOS: 1 ADMISSION DATE:  09/17/2018, CONSULTATION DATE:  12/9 REFERRING MD:  [place] , CHIEF COMPLAINT:   Cardiac arrest   Brief History   Patient status post V. fib arrest on 12/9. Time to ROSC estimated ~ 40 minutes. Myoclonic jerking on arrival [facility].   Past Medical History  CHF (EF 40-45%), CAD 3V (treated medically), PH, HTN, seizure d/o, nephrolithiasis  Significant Hospital Events   12/9: Admitted to [facility] status post V. fib arrest; c cath w/out sig change->severe 3V disease. EEG w/ burst suppression after myoclonic activity. CT brain neg  12/10 remains unresponsive. Still having myoclonus. Off norepinephrine.   Consults:  Critical care  Procedures:  oett 12/9>>> Left IJ CVL 12/9>>>  Significant Diagnostic Tests:  Left Heart cath 12/9: severe 3 V disease w/out sig change from c cath in April 2019 EEG 12/9: burst suppression following generalized myoclonic activity raising concern for ictal etiology  CT brain 12/9: neg for acute finding  ECHO 12/9>>> MRI brain 12/10>>>  Micro Data:  Sputum 12/9>>> Blood 12/9>>> Urine 12/9>>>  Antimicrobials:  unasyn 12/9>>> Interim history/subjective:  No sig change still having myoclonus  Objective   Blood pressure 124/64, pulse 80, temperature 99.5 F (37.5 C), resp. rate (Abnormal) 22, weight (Abnormal) 177.8 kg, SpO2 99 %.    Vent Mode: PRVC FiO2 (%):  [70 %-100 %] 70 % Set Rate:  [22 bmp] 22 bmp Vt Set:  [680 mL] 680 mL PEEP:  [5 cmH20] 5 cmH20 Plateau Pressure:  [19 cmH20-24 cmH20] 21 cmH20   Intake/Output Summary (Last 24 hours) at 09/20/2018 0830 Last data filed at 09/20/2018 0700 Gross per 24 hour  Intake 2668.85 ml  Output 1470 ml  Net 1198.85 ml   Filed Weights   10/01/2018 1200  Weight: (Abnormal) 177.8 kg    Examination:  General 57 year old obese white male remains unresponsive. Not purposeful and having myoclonic jerks w/ painful  stim HENT NCAT orally intubated Pulm clear, decreased bases Card RRR abd obese + bowel sounds GU CNC yellow Neuro as above. Unresponsive + myoclonus w/ noxious stim EXT chronic venous stasis changes   Resolved Hospital Problem list   Anion gap metabolic acidosis w/ Lactic acidosis s/p cardiac arrest  Assessment & Plan:  S/p VF arrest in setting of known 3V CAD and underlying ICM (EF 40-45%) -cardiac cath lab: no sig change c/w April 2018 Plan Cont tele Asa supp daily Cont amio   Acute respiratory failure s/p cardiac arrest Pulmonary edema w/ ATX +/- aspiration PCXR personally reviewed: ett in good position. W/ persistent bibasilar atx. Can't exclude PNA Plan Cont full vent support wean FIO2; adjusting peep for recruitment and in effort to down-titrate FIO2 Lasix X 1 unasyn day 2 of 7 Am cxr PAD protocol RASS goal 0 to -1  Acute metabolic/hypoxic  Encephalopathy. Myoclonic Jerking on arrival. Has h/o seizure d/o. -on-going intermittent myoclonus activity t/o the night -EEG showing burst suppression w/ question ictal etiology & CT head neg Plan Cont propofol gtt Cont keppra and dilantin  PRN versed for seizure activity Will defer further titration of AEDs to neuro Will need MRI; will order this today  AKI -> Scr worse.  Plan Renal dose meds Trend chem Strict I&O  Macrocytic anemia  Plan Trend cbc  Leukocytosis ->favor reactive but can't exclude aspiration event; trending down Plan Trending cbc, f/u pending cultures  Hyperglycemia Plan  ssi  Best practice:   Diet: NPO Pain/Anxiety/Delirium protocol (if indicated): 12/9 VAP protocol (if indicated): 12/9 DVT prophylaxis: LMWH GI prophylaxis: PPI Glucose control: ssi Mobility: BR Code Status: full code  Family Communication: pending  Disposition: critically ill s/p prolonged cardiac arrest. Focus today will be on neuro-prognostics. Suspect that he has sustained a severe hypoxic injury  [name] ACNP-BC [place] Pager # [phone] OR # [phone] if no answer  Attending Note:  57 year old male s/p cardiac arrest with unknown downtime who was completely unresponsive and did not undergo TTM due to concern for sepsis and unknown downtime.  Overnight, EEG was done that revealed a concern for epileptiform areas.  On exam, he is no longer myoclonic but remains completely unresponsive even off propofol with myoclonus off propofol with clear lungs on exam.  I reviewed CXR myself, ETT is in a good position.  Discussed with PCCM-NP and neurology.  Will continue propofol for now.  Begin Depakote.  Hold off weaning for now.  Continue vent support.  Adjust vent for ABG.  F/U on cultures.  Continue unasyn.  PCCM will continue to follow.  The patient is critically ill with multiple organ systems failure and requires high complexity decision making for assessment and support, frequent evaluation and titration of therapies, application of advanced monitoring technologies and extensive interpretation of multiple databases.   Critical Care Time devoted to patient care services described in this note is  33  Minutes. This time reflects time of care of this signee Dr [name]. This critical care time does not reflect procedure time, or teaching time or supervisory time of PA/NP/Med student/Med Resident etc but could involve care discussion time.  [name], M.D. [facility] Pulmonary/Critical Care Medicine. Pager: [phone]. After hours pager: [phone].

## 2018-09-20 NOTE — Progress Notes (Signed)
  Echocardiogram 2D Echocardiogram has been performed.  Darlina Sicilian M 09/20/2018, 3:18 PM

## 2018-09-20 NOTE — Progress Notes (Signed)
CDS called and updated on code status change and plans for terminal extubation tomorrow at 2 pm.

## 2018-09-20 NOTE — Progress Notes (Signed)
Initial Nutrition Assessment  DOCUMENTATION CODES:   Morbid obesity  INTERVENTION:    Vital High Protein at goal rate of 10 ml/h (240 ml per day) and Prostat 60 ml 5 times daily   TF + Propofol provides 2647 kcals, 171 gm protein, 200 ml free water daily  Unable to meet 100% of estimated protein needs given Propofol use  NUTRITION DIAGNOSIS:   Inadequate oral intake related to inability to eat as evidenced by NPO status  GOAL:   Provide needs based on ASPEN/SCCM guidelines  MONITOR:   TF tolerance, Vent status, Labs, Skin, Weight trends, I & O's  REASON FOR ASSESSMENT:   Consult Enteral/tube feeding initiation and management  ASSESSMENT:   57 yo Male with PMH of CHF, CAD, HTN, seizure d/o, nephrolithiasis; s/p V. fib arrest on 12/9. Time to ROSC estimated ~ 40 minutes. Myoclonic jerking on arrival to [facility].   Patient is currently intubated on ventilator support Temp (24hrs), Avg:100.3 F (37.9 C), Min:99 F (37.2 C), Max:101.7 F (38.7 C)  Propofol: 53.3 ml/hr >> 1407 fat kcals   NGT in place  RD unable to obtain nutrition hx from patient. No nutrition problems identified PTA. No family at bedside.  S/p cardiac cath which revealed severe 3V disease. EEG w/ burst suppression after myoclonic activity. CT brain negative.  Patient remains unresponsive today. Off norepinephrine.   Vital High Protein ordered per Adult Tube Feeding Protocol. Labs & medications reviewed. Cr 1.42 (H). CBG's [id number].  NUTRITION - FOCUSED PHYSICAL EXAM:    Most Recent Value  Orbital Region  No depletion  Upper Arm Region  No depletion  Thoracic and Lumbar Region  Unable to assess  Buccal Region  No depletion  Temple Region  No depletion  Clavicle Bone Region  No depletion  Clavicle and Acromion Bone Region  No depletion  Scapular Bone Region  Unable to assess  Dorsal Hand  No depletion  Patellar Region  No depletion  Anterior Thigh Region  No depletion  Posterior  Calf Region  No depletion  Edema (RD Assessment)  None     Diet Order:   Diet Order            Diet NPO time specified  Diet effective now             EDUCATION NEEDS:   Not appropriate for education at this time  Skin:  Skin Assessment: Reviewed RN Assessment  Last BM:  PTA   Intake/Output Summary (Last 24 hours) at 09/20/2018 1213 Last data filed at 09/20/2018 0900 Gross per 24 hour  Intake 2961.67 ml  Output 1470 ml  Net 1491.67 ml   Height:   Ht Readings from Last 1 Encounters:  08/31/18 6' 3.5" (1.918 m)   Weight:   Wt Readings from Last 1 Encounters:  09/13/2018 (!) 177.8 kg   Ideal Body Weight:  89 kg  BMI:  Body mass index is 48.35 kg/m.  Estimated Nutritional Needs:   Kcal:  5456-2563  Protein:  >/= 178 gm  Fluid:  per MD  Arthur Holms, RD, LDN Pager #: [phone] After-Hours Pager #: [phone]

## 2018-09-21 ENCOUNTER — Inpatient Hospital Stay (HOSPITAL_COMMUNITY): Payer: 59

## 2018-09-21 DIAGNOSIS — Z7189 Other specified counseling: Secondary | ICD-10-CM

## 2018-09-21 LAB — COMPREHENSIVE METABOLIC PANEL
ALT: 44 U/L (ref 0–44)
AST: 67 U/L — ABNORMAL HIGH (ref 15–41)
Albumin: 2.8 g/dL — ABNORMAL LOW (ref 3.5–5.0)
Alkaline Phosphatase: 88 U/L (ref 38–126)
Anion gap: 12 (ref 5–15)
BUN: 24 mg/dL — ABNORMAL HIGH (ref 6–20)
CO2: 26 mmol/L (ref 22–32)
Calcium: 8.8 mg/dL — ABNORMAL LOW (ref 8.9–10.3)
Chloride: 104 mmol/L (ref 98–111)
Creatinine, Ser: 1.38 mg/dL — ABNORMAL HIGH (ref 0.61–1.24)
GFR calc Af Amer: >60 mL/min (ref >60)
GFR, EST NON AFRICAN AMERICAN: 57 mL/min — AB (ref >60)
Glucose, Bld: 143 mg/dL — ABNORMAL HIGH (ref 70–99)
Potassium: 4.1 mmol/L (ref 3.5–5.1)
Sodium: 142 mmol/L (ref 135–145)
Total Bilirubin: 1 mg/dL (ref 0.3–1.2)
Total Protein: 6.6 g/dL (ref 6.5–8.1)

## 2018-09-21 LAB — GLUCOSE, CAPILLARY
Glucose-Capillary: 137 mg/dL — ABNORMAL HIGH (ref 70–99)
Glucose-Capillary: 143 mg/dL — ABNORMAL HIGH (ref 70–99)
Glucose-Capillary: 158 mg/dL — ABNORMAL HIGH (ref 70–99)
Glucose-Capillary: 160 mg/dL — ABNORMAL HIGH (ref 70–99)

## 2018-09-21 LAB — CBC
HCT: 34.5 % — ABNORMAL LOW (ref 39.0–52.0)
Hemoglobin: 10.6 g/dL — ABNORMAL LOW (ref 13.0–17.0)
MCH: 30.6 pg (ref 26.0–34.0)
MCHC: 30.7 g/dL (ref 30.0–36.0)
MCV: 99.7 fL (ref 80.0–100.0)
Platelets: 221 10*3/uL (ref 150–400)
RBC: 3.46 MIL/uL — ABNORMAL LOW (ref 4.22–5.81)
RDW: 12.5 % (ref 11.5–15.5)
WBC: 18.2 10*3/uL — ABNORMAL HIGH (ref 4.0–10.5)
nRBC: 0 % (ref 0.0–0.2)

## 2018-09-21 LAB — CULTURE, RESPIRATORY W GRAM STAIN: Culture: NORMAL

## 2018-09-21 LAB — PHENYTOIN LEVEL, TOTAL: PHENYTOIN LVL: 6.8 ug/mL — AB (ref 10.0–20.0)

## 2018-09-21 LAB — VALPROIC ACID LEVEL: Valproic Acid Lvl: 25 ug/mL — ABNORMAL LOW (ref 50.0–100.0)

## 2018-09-21 LAB — CULTURE, RESPIRATORY

## 2018-09-21 MED ORDER — MORPHINE SULFATE (PF) 2 MG/ML IV SOLN: 2.0000 mg | INTRAVENOUS | Status: DC | PRN

## 2018-09-21 MED ORDER — SODIUM CHLORIDE 0.9% FLUSH: 10.0000 mL | INTRAVENOUS | Status: DC | PRN

## 2018-09-21 MED ORDER — POLYVINYL ALCOHOL 1.4 % OP SOLN: 1.0000 [drp] | Freq: Four times a day (QID) | OPHTHALMIC | Status: DC | PRN

## 2018-09-21 MED ORDER — GLYCOPYRROLATE 0.2 MG/ML IJ SOLN: 0.2000 mg | INTRAMUSCULAR | Status: DC | PRN

## 2018-09-21 MED ORDER — MORPHINE BOLUS VIA INFUSION
5.0000 mg | INTRAVENOUS | Status: DC | PRN
  Filled 2018-09-21: qty 5

## 2018-09-21 MED ORDER — DEXTROSE 5 % IV SOLN
INTRAVENOUS | Status: DC
  Administered 2018-09-21: 15:00:00 via INTRAVENOUS

## 2018-09-21 MED ORDER — DIPHENHYDRAMINE HCL 50 MG/ML IJ SOLN: 25.0000 mg | INTRAMUSCULAR | Status: DC | PRN

## 2018-09-21 MED ORDER — GLYCOPYRROLATE 1 MG PO TABS
1.0000 mg | ORAL_TABLET | ORAL | Status: DC | PRN
  Filled 2018-09-21: qty 1

## 2018-09-21 MED ORDER — MORPHINE 100MG IN NS 100ML (1MG/ML) PREMIX INFUSION
0.0000 mg/h | INTRAVENOUS | Status: DC
  Administered 2018-09-21: 5 mg/h via INTRAVENOUS
  Filled 2018-09-21: qty 100

## 2018-09-21 MED ORDER — CHLORHEXIDINE GLUCONATE CLOTH 2 % EX PADS
6.0000 | MEDICATED_PAD | Freq: Every day | CUTANEOUS | Status: DC
  Administered 2018-09-21: 6 via TOPICAL

## 2018-09-24 LAB — CULTURE, BLOOD (ROUTINE X 2)
Culture: NO GROWTH
Culture: NO GROWTH
Special Requests: ADEQUATE
Special Requests: ADEQUATE

## 2018-09-29 ENCOUNTER — Telehealth: Payer: Self-pay | Admitting: *Deleted

## 2018-09-29 NOTE — Telephone Encounter (Signed)
Received D/C from [facility] -D/C forwarded to Dr.Mannam for Dr.Yaoub to be signed

## 2018-10-03 NOTE — Telephone Encounter (Signed)
10/03/18 I received D/C back from Dr. Vaughan Browner today and Mailed it to [place] Dept.[name] PWR

## 2018-10-12 NOTE — Progress Notes (Addendum)
NAME:  Melvin Villarreal, MRN:  [id number], DOB:  April 06, 1961, LOS: 2 ADMISSION DATE:  09/11/2018, CONSULTATION DATE:  12/9 REFERRING MD:  [place] , CHIEF COMPLAINT:   Cardiac arrest   Brief History   Patient status post V. fib arrest on 12/9. Time to ROSC estimated ~ 40 minutes. Myoclonic jerking on arrival [facility].   Past Medical History  CHF (EF 40-45%), CAD 3V (treated medically), PH, HTN, seizure d/o, nephrolithiasis  Significant Hospital Events   12/9: Admitted to [facility] status post V. fib arrest; c cath w/out sig change->severe 3V disease. EEG w/ burst suppression after myoclonic activity. CT brain neg  12/10 remains unresponsive. Still having myoclonus. Off norepinephrine. MRI w/ diffuse anoxic injury. Discussion w/ family. Made plan for DNR and extubation w/ transition to comfort  Consults:  Critical care  Procedures:  oett 12/9>>> Left IJ CVL 12/9>>>  Significant Diagnostic Tests:  Left Heart cath 12/9: severe 3 V disease w/out sig change from c cath in April 2019 EEG 12/9: burst suppression following generalized myoclonic activity raising concern for ictal etiology  CT brain 12/9: neg for acute finding  ECHO 12/9: EF 40% -45%; w/ inferior septal hypokinesis MRI brain 12/10: diffuse hypoxic injury; neg for hemorrhage   Micro Data:  Sputum 12/9: normal flora  Blood 12/9>>> Urine 12/9>>>  Antimicrobials:  unasyn 12/9>>>12/10 Interim history/subjective:  No change  Objective   Blood pressure (Abnormal) 128/45, pulse (Abnormal) 104, temperature 100 F (37.8 C), temperature source Bladder, resp. rate 12, weight (Abnormal) 177.8 kg, SpO2 99 %.    Vent Mode: PRVC FiO2 (%):  [70 %] 70 % Set Rate:  [22 bmp] 22 bmp Vt Set:  [670 mL-680 mL] 670 mL PEEP:  [5 cmH20-10 cmH20] 10 cmH20 Plateau Pressure:  [20 cmH20-23 cmH20] 23 cmH20   Intake/Output Summary (Last 24 hours) at 10/10/18 0900 Last data filed at 2018-10-10 0800 Gross per 24 hour  Intake 2190 ml    Output 950 ml  Net 1240 ml   Filed Weights   09/18/2018 1200  Weight: (Abnormal) 177.8 kg    Examination: General: This obese 58 year old male patient currently unresponsive on full mechanical ventilation HEENT normocephalic atraumatic pupils are upward gazing Pulmonary: Scattered rhonchi no accessory use remains on high FiO2 and PEEP Cardiac: Regular rate and rhythm Abdomen: Soft nontender Extremities: Chronic venous stasis changes lower extremity edema is present Neurological: Continues to exhibit myoclonic activity to noxious stimulus otherwise no change  Resolved Hospital Problem list   Anion gap metabolic acidosis w/ Lactic acidosis s/p cardiac arrest  Assessment & Plan:  S/p VF arrest in setting of known 3V CAD and underlying ICM (EF 40-45%) Acute respiratory failure s/p cardiac arrest Pulmonary edema w/ ATX +/- aspiration Acute metabolic/hypoxic  Encephalopathy. Myoclonic Jerking on arrival. Has h/o seizure d/o. AKI Macrocytic anemia  Leukocytosis Hyperglycemia  Discussion He has had a catastrophic neurological insult following prolonged cardiac arrest resulting in anoxic brain injury.  He has had no neurological improvement.  Continues to demonstrate myoclonic activity.  After a prolonged discussion with his father and brother on 12/10 the plan was made to proceed with withdrawal of care.  The family had requested until today before proceeding with this so that the entire family could have an opportunity to see the patient  Plan Full DNR DNI All medications that do not contribute to comfort have been discontinued We will continue anticonvulsants given myoclonus Anticipate extubation later this afternoon Full comfort measures   Best  practice:   Diet: NPO Pain/Anxiety/Delirium protocol (if indicated): 12/9 VAP protocol (if indicated): 12/9 DVT prophylaxis: LMWH GI prophylaxis: PPI Glucose control: ssi Mobility: BR Code Status: full code  Family Communication:  pending  Disposition: withdraw of care later today   [name] ACNP-BC [place] Pager # [phone] OR # [phone] if no answer   Attending Note:  58 year old male s/p cardiac arrest with unknown downtime who presents to PCCM in the cardiac ICU.  Patient has had no events overnight and has not woke up.  EEG concerning for seizure but MRI is consistent with severe anoxic injury that I reviewed myself.  Neurology does not feel patient has a reasonable chance at recovery.  He remains unresponsive on exam, with only a respiratory drive present.  Discussed with PCCM-NP.  Discussed with family.  Patient has no reasonable chance at recovery.  Family is waiting for additional members to arrive then will proceed with withdrawal this afternoon.  Will d/c all medications unrelated to comfort and when family is ready then will start morphine and extubate to comfort.  The patient is critically ill with multiple organ systems failure and requires high complexity decision making for assessment and support, frequent evaluation and titration of therapies, application of advanced monitoring technologies and extensive interpretation of multiple databases.   Critical Care Time devoted to patient care services described in this note is  32  Minutes. This time reflects time of care of this signee Dr [name]. This critical care time does not reflect procedure time, or teaching time or supervisory time of PA/NP/Med student/Med Resident etc but could involve care discussion time.  [name], M.D. [facility] Pulmonary/Critical Care Medicine. Pager: [phone]. After hours pager: [phone].

## 2018-10-12 NOTE — Progress Notes (Signed)
Nutrition Brief Note  Chart reviewed. Pt to transition to comfort care.  No further nutrition interventions warranted at this time.  Please re-consult as needed.   [place], [place], [place] Pager [phone] After Hours Pager

## 2018-10-12 NOTE — Progress Notes (Signed)
Time of death 21, death pronounced by this RN and Lucius Conn RN.

## 2018-10-12 NOTE — Death Summary Note (Signed)
DEATH SUMMARY   Patient Details  Name: Melvin Villarreal MRN: [id number] DOB: July 12, 1961  Admission/Discharge Information   Admit Date:  Oct 01, 2018  Date of Death: Date of Death: 10-03-2018  Time of Death: Time of Death: 17-Jan-1847  Length of Stay: 2  Referring Physician: Claretta Fraise, MD   Reason(s) for Hospitalization  Ventricular fibrillation cardiac arrest  Diagnoses  Preliminary cause of death: Ventricular fibrillation cardiac arrest   Secondary Diagnoses (including complications and co-morbidities):  Principal Problem:   Cardiac arrest [facility]) Active Problems:   Seizure disorder ([place])   Morbid obesity ([place])   Hypertension   CAD (coronary artery disease)   Dyslipidemia   Normocytic anemia   Acute respiratory failure ([place])   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  He was found unresponsive at 7:05 AM in the waiting area.  He was placed on telemetry and be V. fib arrest.  He underwent multiple rounds of ACLS CVA multiple defibrillations, epinephrine x4, lidocaine, and bicarbonate, prior to ROSC (estimate time to ROSC at least 12 minutes based on receiving 4 doses of epi) he was transferred post cardiac arrest.  On arrival to [facility] he was orally intubated and mechanically ventilated he was exhibiting pinpoint pupils and myoclonic jerking he was brought emergently to the cardiac catheterization lab to look for the culprit vessel.  Critical care was asked to evaluate cardiac Cath Lab.   MRI results discussed w/ family. They all understand that this represents a catastrophic neurological insult and the chance of meaningful neurological recovery is unlikely at best. Based on the patients previously shared values the patient's father, uncle and brother all  agree on DNR status w/ no escalation. They also do not want trach/PEG/prolonged care in SNF. They unanimously agree on removal of ETT and palliative focus but need time to get the rest of the family   Neurology does not feel patient has a reasonable chance at recovery.  He remains unresponsive on exam, with only a respiratory drive present.  Discussed with PCCM-NP.  Discussed with family.  Patient has no reasonable chance at recovery.  Family is waiting for additional members to arrive then will proceed with withdrawal this afternoon.  Will d/c all medications unrelated to comfort and when family is ready then will start morphine and extubate to comfort.  Family arrived, morphine was started, patient was extubated to expire shortly thereafter with the family bedside.     Pertinent Labs and Studies  Significant Diagnostic Studies Ct Head Wo Contrast  Result Date: 09/20/2018 CLINICAL DATA:  Altered mental status. EXAM: CT HEAD WITHOUT CONTRAST TECHNIQUE: Contiguous axial images were obtained from the base of the skull through the vertex without intravenous contrast. COMPARISON:  None. FINDINGS: Brain: Small rounded area of low density in the white matter adjacent to the frontal horn of the left lateral ventricle. Otherwise, normal appearing cerebral hemispheres and posterior fossa structures. Normal size and position of the ventricles. No intracranial hemorrhage, mass lesion or CT evidence of acute infarction. Vascular: No hyperdense vessel or unexpected calcification. Skull: Normal. Negative for fracture or focal lesion. Sinuses/Orbits: Unremarkable. Other: None. IMPRESSION: 1. No acute abnormality. 2. 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 Electronically Signed   By: Claudie Revering M.D.   On: 09/20/2018 02:24   Mr Brain Lottie Dawson  Contrast  Result Date: 09/20/2018 CLINICAL DATA:  Cardiac arrest. EXAM: MRI HEAD WITHOUT CONTRAST TECHNIQUE: Multiplanar, multiecho  pulse sequences of the brain and surrounding structures were obtained without intravenous contrast. COMPARISON:  Head CT 09/20/2018 FINDINGS: Brain: There is extensive restricted diffusion and edema throughout the cerebellum and cortex of both cerebral hemispheres. There is also involvement of the basal ganglia and thalami. No intracranial hemorrhage, midline shift, or extra-axial fluid collection is identified. There is no tonsillar herniation. Scattered small foci of T2 hyperintensity in the cerebral white matter nonspecific but compatible with mild chronic small vessel ischemic disease with a chronic lacunar infarct noted in the white matter adjacent to the left frontal horn. Scattered mildly dilated perivascular spaces are also present in the cerebral white matter bilaterally. The ventricles and sulci are normal in size. Vascular: Major intracranial vascular flow voids are preserved. Skull and upper cervical spine: Unremarkable bone marrow signal. Sinuses/Orbits: Unremarkable orbits. Small bilateral mastoid effusions. Minimal bilateral ethmoid air cell mucosal thickening. Other: None. IMPRESSION: Diffuse hypoxic ischemic injury. No evidence of hemorrhage or herniation. Electronically Signed   By: Logan Bores M.D.   On: 09/20/2018 13:08   Dg Chest Port 1 View  Result Date: 20-Oct-2018 CLINICAL DATA:  Respiratory failure. EXAM: PORTABLE CHEST 1 VIEW COMPARISON:  09/20/2018 FINDINGS: Endotracheal tube terminates at the inferior margin of the clavicular heads, unchanged. Enteric tube courses into the left upper abdomen with tip not imaged. Left jugular catheter terminates over the upper SVC, unchanged. The cardiac silhouette remains enlarged. Patchy and curvilinear opacities in both lung bases are unchanged. There are likely persistent small bilateral pleural effusions. Mild pulmonary vascular congestion is unchanged. No pneumothorax is identified. IMPRESSION: Unchanged cardiomegaly, pulmonary vascular  congestion, bibasilar atelectasis, and small pleural effusions. Electronically Signed   By: Logan Bores M.D.   On: 2018/10/20 09:19   Dg Chest Port 1 View  Result Date: 09/20/2018 CLINICAL DATA:  Advancement of endotracheal tube, evaluate position EXAM: PORTABLE CHEST 1 VIEW COMPARISON:  Portable chest x-ray of 09/20/2017 FINDINGS: The tip of the endotracheal tube is approximately 5.5 cm above the carina. Left IJ central venous line tip overlies the junction of the left innominate vein and SVC. Opacities at the lung bases are consistent with atelectasis and effusions. Pneumonia is difficult to exclude particularly at the left lung base. Cardiomegaly is stable. NG tube extends below the hemidiaphragm. IMPRESSION: 1. Tip of endotracheal tube 5.5 cm above the carina. 2. Bibasilar opacities left-greater-than-right most consistent with atelectasis and effusions. Pneumonia cannot be excluded. 3. Stable cardiomegaly. Electronically Signed   By: Ivar Drape M.D.   On: 09/20/2018 12:02   Dg Chest Port 1 View  Result Date: 09/20/2018 CLINICAL DATA:  Endotracheal tube repositioning. EXAM: PORTABLE CHEST 1 VIEW COMPARISON:  Radiograph of same day. FINDINGS: Stable cardiomediastinal silhouette. Endotracheal tube is seen projected over tracheal air shadow with distal tip 12 cm above the carina, and is not significantly changed compared to prior exam. Nasogastric tube is unchanged. Left internal jugular catheter is unchanged. No pneumothorax is noted. Stable bibasilar opacities are noted concerning for subsegmental atelectasis with possible small pleural effusions. Bony thorax is unremarkable. IMPRESSION: Endotracheal tube tip remains 12 cm above the carina and is not significantly changed compared to prior exam. Continued advancement is recommended. Nasogastric tube and left internal jugular catheter are not significantly changed. Stable bibasilar opacities as described above. Electronically Signed   By: Marijo Conception, M.D.   On: 09/20/2018 11:57   Dg  Chest Port 1 View  Result Date: 09/20/2018 CLINICAL DATA:  58 year old male status post endotracheal tube manipulation EXAM: PORTABLE CHEST 1 VIEW COMPARISON:  Prior chest x-ray 09/20/2018 FINDINGS: The patient's endotracheal tube overlies the thoracic inlet approximately 14 cm above the carina. Left IJ central venous catheter in stable position with the tip overlying the distal left innominate vein. Persistent cardiomegaly, low inspiratory volumes with bibasilar atelectasis and likely layering pleural effusions. No pneumothorax. No acute osseous abnormality. IMPRESSION: 1. High position of the endotracheal tube which is 14 cm above the carina. 2. Stable cardiomegaly, pulmonary vascular congestion, small bilateral layering pleural effusions and bibasilar atelectasis. Electronically Signed   By: Jacqulynn Cadet M.D.   On: 09/20/2018 11:55   Portable Chest 1 View  Result Date: 09/20/2018 CLINICAL DATA:  Status post cardiac arrest, acute respiratory failure, intubated patient. EXAM: PORTABLE CHEST 1 VIEW COMPARISON:  Portable chest x-ray of September 19, 2018 FINDINGS: The lungs are borderline hypoinflated. The interstitial markings remain increased bilaterally. Confluent density in the right mid to lower lung is stable. The cardiac silhouette remains enlarged. The pulmonary vascularity remains engorged. The endotracheal tube tip projects between the clavicular heads approximately 7.5 cm above the carina. The esophagogastric tube tip and proximal port project below the GE junction. The left internal jugular venous catheter tip projects at the junction of the right and left brachiocephalic veins. IMPRESSION: Borderline hypoinflation. Persistent pulmonary interstitial edema. Persistent atelectasis in the right mid to lower lung. A stable cardiomegaly and pulmonary vascular congestion. The support tubes are in reasonable position. Electronically Signed   By: David  Martinique  M.D.   On: 09/20/2018 09:05   Dg Chest Port 1 View  Result Date: 09/28/2018 CLINICAL DATA:  Central line placement, intubation EXAM: PORTABLE CHEST 1 VIEW COMPARISON:  Portable exam 1222 hours compared to 0804 hours FINDINGS: Tip of endotracheal tube projects 6.6 cm above carina. LEFT jugular central venous catheter tip projects over SVC. Enlargement of cardiac silhouette with vascular congestion. Interstitial infiltrates question pulmonary edema, asymmetrically greater on LEFT. Subsegmental atelectasis at minor fissure. No pleural effusion or pneumothorax. IMPRESSION: Line and tube positions as above. Enlargement of cardiac silhouette with pulmonary vascular congestion and probable asymmetric pulmonary edema. Subsegmental atelectasis RIGHT base. Electronically Signed   By: Lavonia Dana M.D.   On: 09/20/2018 12:43   Dg Chest Portable 1 View  Result Date: 09/27/2018 CLINICAL DATA:  Shortness of breath EXAM: PORTABLE CHEST 1 VIEW COMPARISON:  12/22/2017 FINDINGS: Endotracheal tube is 7.5 cm above the carina. Cardiomegaly with vascular congestion. Mild interstitial prominence could reflect interstitial edema. No visible effusions or acute bony abnormality. IMPRESSION: Endotracheal tube 7.5 cm above the carina. Cardiomegaly with vascular congestion and probable early interstitial edema. Electronically Signed   By: Rolm Baptise M.D.   On: 09/14/2018 08:36   Dg Abd Portable 1v  Result Date: 09/20/2018 CLINICAL DATA:  Confirmation of NG tube placement. EXAM: PORTABLE ABDOMEN - 1 VIEW 10:21 a.m. COMPARISON:  09/20/2018 at 10:09 a.m. FINDINGS: NG tube has been advanced and the tip is now in the fundus of the stomach. Proximal side hole is at the gastroesophageal junction. The visualized bowel gas pattern is normal. No acute bone abnormality. IMPRESSION: NG tube tip is now in the fundus of the stomach. Electronically Signed   By: Lorriane Shire M.D.   On: 09/20/2018 10:33   Dg Abd Portable 1v  Result Date:  09/20/2018 CLINICAL DATA:  NG tube placement EXAM: PORTABLE ABDOMEN - 1 VIEW COMPARISON:  10/10/2018 FINDINGS: NG tube tip is in the distal esophagus several cm above the GE junction. Probable gaseous distention of the stomach. IMPRESSION: NG tube tip in the distal esophagus. Electronically Signed   By: Rolm Baptise M.D.   On: 09/20/2018 10:24   Dg Abd Portable 1v  Result Date: 10/11/2018 CLINICAL DATA:  OG tube placement EXAM: PORTABLE ABDOMEN - 1 VIEW COMPARISON:  10/07/2018 FINDINGS: Esophageal tube tip in the region of GE junction, side-port projects over distal esophagus. Airspace disease at the left base. IMPRESSION: Esophageal tube side port projects over distal esophagus, suggest further advancement for more optimal positioning. Electronically Signed   By: Donavan Foil M.D.   On: 09/18/2018 23:04   Dg Abd Portable 1v  Result Date: 09/13/2018 CLINICAL DATA:  Orogastric tube placement. EXAM: PORTABLE ABDOMEN - 1 VIEW COMPARISON:  10/02/2018. FINDINGS: Orogastric tube tip in the proximal stomach. The side hole is in the distal esophagus, not included on the image. Stable normal bowel gas pattern. Lumbar and lower thoracic spine degenerative changes and mild scoliosis. IMPRESSION: 1. Orogastric tube tip in the proximal stomach with the side hole in the distal esophagus, not included on the image. 2. No acute abnormality. Electronically Signed   By: Claudie Revering M.D.   On: 09/25/2018 22:58   Dg Abd Portable 1v  Result Date: 10/01/2018 CLINICAL DATA:  Confirm orogastric tube placement. EXAM: PORTABLE ABDOMEN - 1 VIEW COMPARISON:  None. FINDINGS: Habitus limited examination. Nasogastric tube tip projects at [employer] junction. Included bowel gas pattern is nondilated and nonobstructive. No intra-abdominal mass effect or pathologic calcifications. Osseous structures are nonacute, mild lower lumbar levoscoliosis. IMPRESSION: 1. Habitus limited examination. 2. Nasogastric tube tip projects at [employer] junction.  Electronically Signed   By: Elon Alas M.D.   On: 09/18/2018 22:00    Microbiology Recent Results (from the past 240 hour(s))  Culture, blood (Routine X 2) w Reflex to ID Panel     Status: None   Collection Time: 09/16/2018 12:20 PM  Result Value Ref Range Status   Specimen Description BLOOD LEFT ANTECUBITAL  Final   Special Requests   Final    BOTTLES DRAWN AEROBIC ONLY Blood Culture adequate volume   Culture   Final    NO GROWTH 5 DAYS Performed at [facility], [address] [place], [place] [address]    Report Status 09/24/2018 FINAL  Final  Culture, blood (Routine X 2) w Reflex to ID Panel     Status: None   Collection Time: 09/12/2018 12:25 PM  Result Value Ref Range Status   Specimen Description BLOOD LEFT HAND  Final   Special Requests   Final    BOTTLES DRAWN AEROBIC ONLY Blood Culture adequate volume   Culture   Final    NO GROWTH 5 DAYS Performed at [facility], [place] [address], [place], [place] [address]    Report Status 09/24/2018 FINAL  Final  Culture, respiratory     Status: None   Collection Time: 09/18/2018 12:27 PM  Result Value Ref Range Status   Specimen Description TRACHEAL ASPIRATE  Final   Special Requests NONE  Final   Gram Stain   Final    RARE WBC PRESENT,BOTH PMN AND MONONUCLEAR FEW GRAM POSITIVE COCCI IN PAIRS IN CHAINS RARE GRAM POSITIVE RODS    Culture   Final    Consistent with normal respiratory flora. Performed at [facility], [place] [address] [place],  [address]  Report Status October 09, 2018 FINAL  Final  Urine Culture     Status: None   Collection Time: 09/15/2018 12:49 PM  Result Value Ref Range Status   Specimen Description URINE, CATHETERIZED  Final   Special Requests NONE  Final   Culture   Final    NO GROWTH Performed at [facility], [address] [place], [place] [address]    Report Status 09/20/2018 FINAL  Final  MRSA PCR Screening     Status: None   Collection Time: 09/20/18  4:20 AM   Result Value Ref Range Status   MRSA by PCR NEGATIVE NEGATIVE Final    Comment:        The GeneXpert MRSA Assay (FDA approved for NASAL specimens only), is one component of a comprehensive MRSA colonization surveillance program. It is not intended to diagnose MRSA infection nor to guide or monitor treatment for MRSA infections. Performed at [facility], [place] [address], [place],  [address]     Lab Basic Metabolic Panel: Recent Labs  Lab 10/09/2018 0449  NA 142  K 4.1  CL 104  CO2 26  GLUCOSE 143*  BUN 24*  CREATININE 1.38*  CALCIUM 8.8*   Liver Function Tests: Recent Labs  Lab 2018-10-09 0449  AST 67*  ALT 44  ALKPHOS 88  BILITOT 1.0  PROT 6.6  ALBUMIN 2.8*   No results for input(s): LIPASE, AMYLASE in the last 168 hours. No results for input(s): AMMONIA in the last 168 hours. CBC: Recent Labs  Lab 09-Oct-2018 0449  WBC 18.2*  HGB 10.6*  HCT 34.5*  MCV 99.7  PLT 221   Cardiac Enzymes: No results for input(s): CKTOTAL, CKMB, CKMBINDEX, TROPONINI in the last 168 hours. Sepsis Labs: Recent Labs  Lab 10/09/2018 0449  WBC 18.2*    Procedures/Operations     Naser Schuld 09/27/2018, 4:29 PM

## 2018-10-12 NOTE — Procedures (Signed)
Extubation Procedure Note  Patient Details:   Name: Melvin Villarreal DOB: 21-Sep-1961 MRN: [id number]   Airway Documentation:  Airway 7.5 mm (Active)  Secured at (cm) 26 cm 09/22/2018 11:54 AM  Measured From Lips 09-22-2018 11:54 AM  [place] 09-22-2018 11:54 AM  Secured By [employer] 22-Sep-2018 11:54 AM  Tube Holder Repositioned Yes 2018-09-22 11:54 AM  Cuff Pressure (cm H2O) 28 cm H2O 2018/09/22  9:37 AM  Site Condition Dry September 22, 2018  9:37 AM   Vent end date: (not recorded) Vent end time: (not recorded)   Evaluation  O2 sats: transiently fell during during procedure Complications: Complications of one way extubation DNR status Patient did tolerate procedure well. Bilateral Breath Sounds: Diminished   Yes  [name] 09/22/2018, 3:04 PM

## 2018-10-12 DEATH — deceased

## 2018-12-07 ENCOUNTER — Ambulatory Visit: Payer: 59 | Admitting: Cardiology

## 2019-01-24 ENCOUNTER — Ambulatory Visit: Payer: 59 | Admitting: Family Medicine

## 2019-03-16 IMAGING — DX DG ABD PORTABLE 1V
1 series · 1 of 1 positions shown · non-contrast
Comparison: 09/19/2018.

CLINICAL DATA: Orogastric tube placement.

EXAM:
PORTABLE ABDOMEN - 1 VIEW

[abdomen kub]
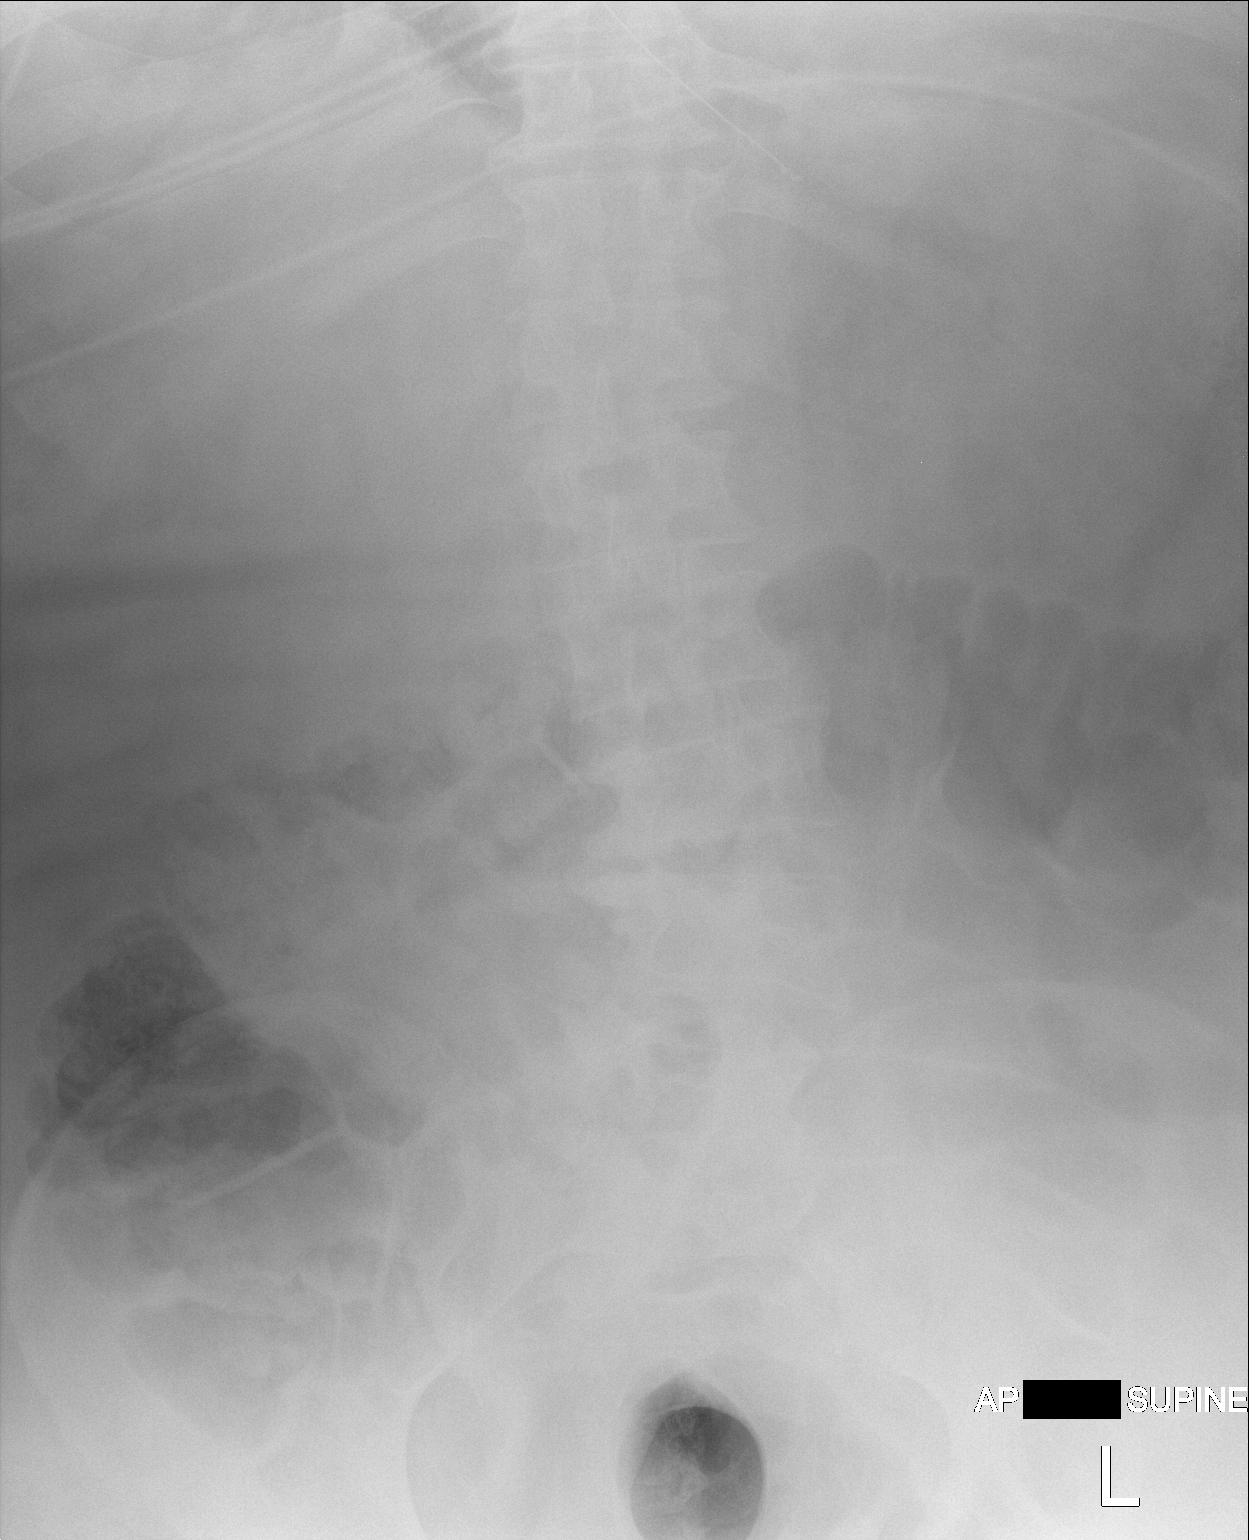

[1 of 1 positions shown; findings below may reference images not displayed]

FINDINGS: Orogastric tube tip in the proximal stomach. The side hole is in the
distal esophagus, not included on the image. Stable normal bowel gas
pattern. Lumbar and lower thoracic spine degenerative changes and
mild scoliosis.
IMPRESSION: 1. Orogastric tube tip in the proximal stomach with the side hole in
the distal esophagus, not included on the image.
2. No acute abnormality.

## 2019-03-16 IMAGING — DX DG ABD PORTABLE 1V
1 series · 1 of 1 positions shown · non-contrast
Comparison: 09/19/2018

CLINICAL DATA: OG tube placement

EXAM:
PORTABLE ABDOMEN - 1 VIEW

[abdomen kub]
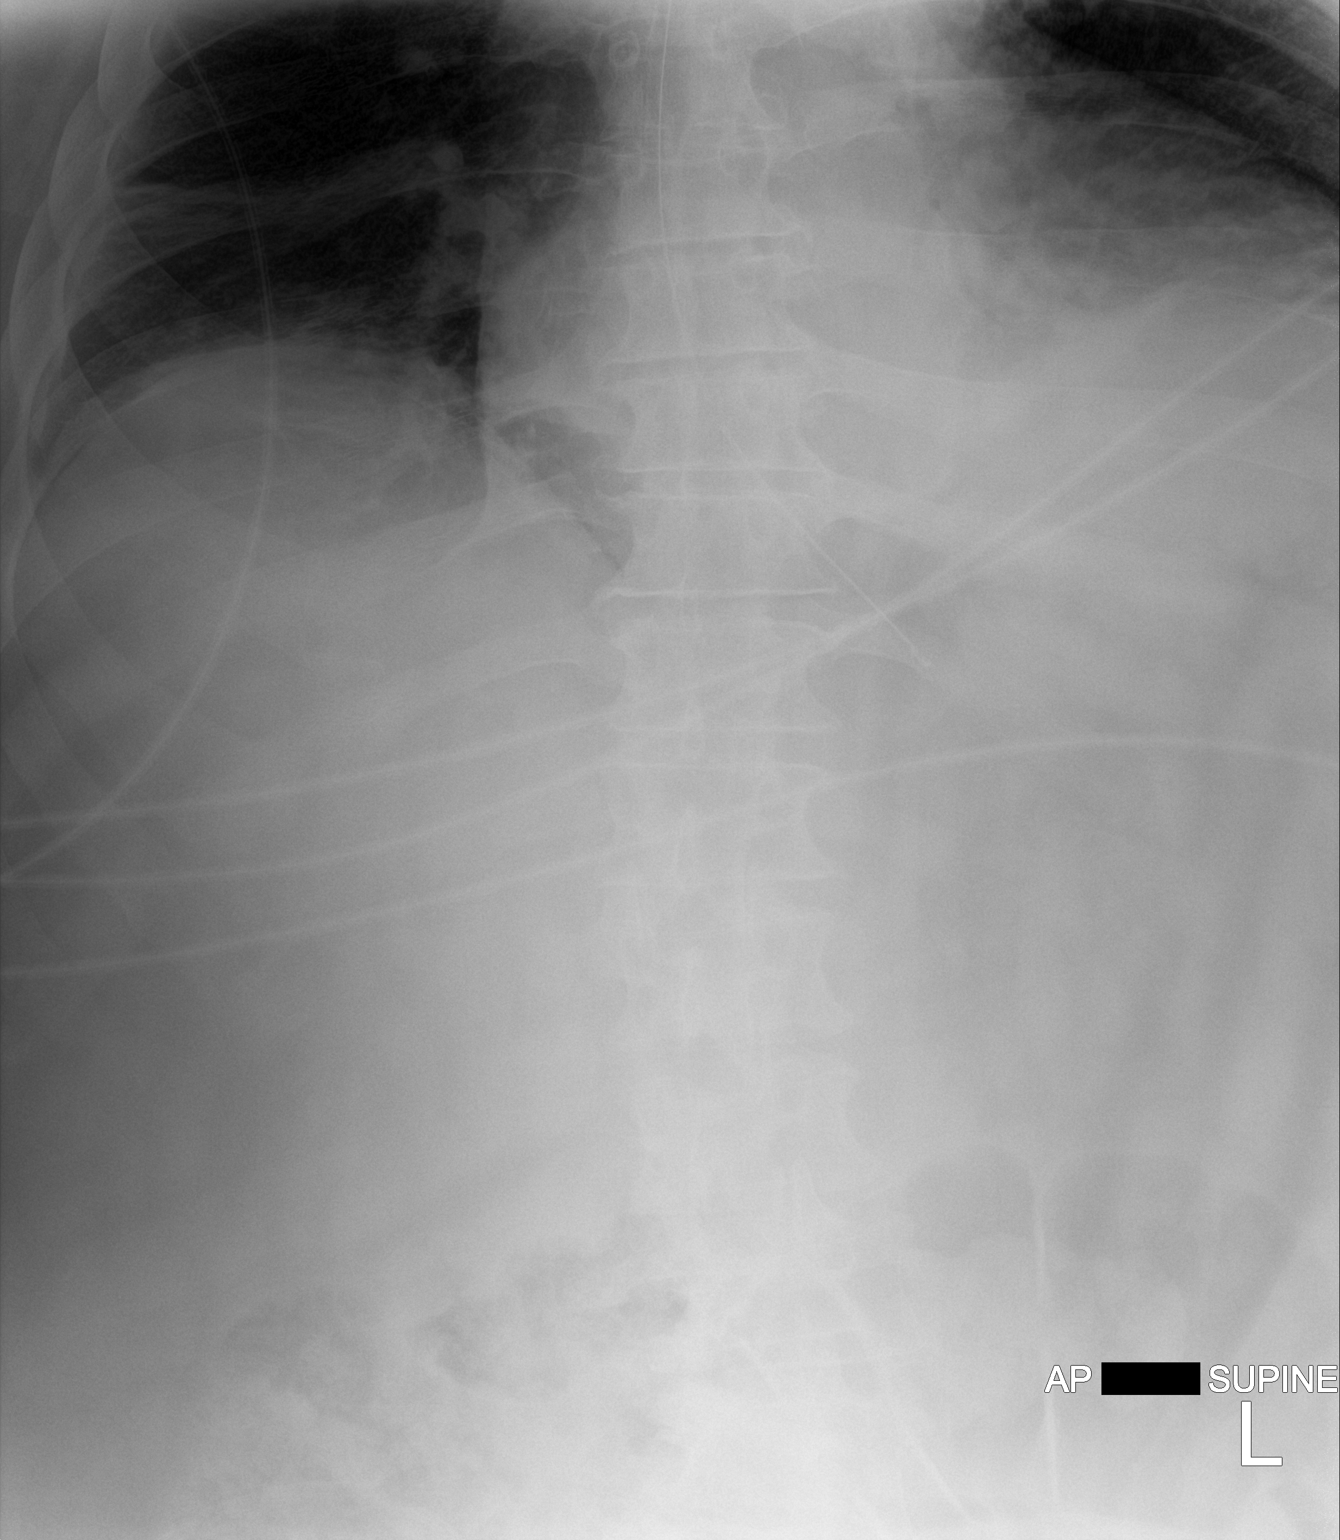

[1 of 1 positions shown; findings below may reference images not displayed]

FINDINGS: Esophageal tube tip in the region of GE junction, side-port projects
over distal esophagus. Airspace disease at the left base.
IMPRESSION: Esophageal tube side port projects over distal esophagus, suggest
further advancement for more optimal positioning.

## 2019-03-17 IMAGING — DX DG CHEST 1V PORT
1 series · 2 of 2 positions shown · non-contrast
Comparison: Portable chest x-ray September 19, 2018

CLINICAL DATA: Status post cardiac arrest, acute respiratory
failure, intubated patient.

EXAM:
PORTABLE CHEST 1 VIEW

[Series 1: chest · 0.14mm/px · 2 of 2 slices shown]
[im 1/2]
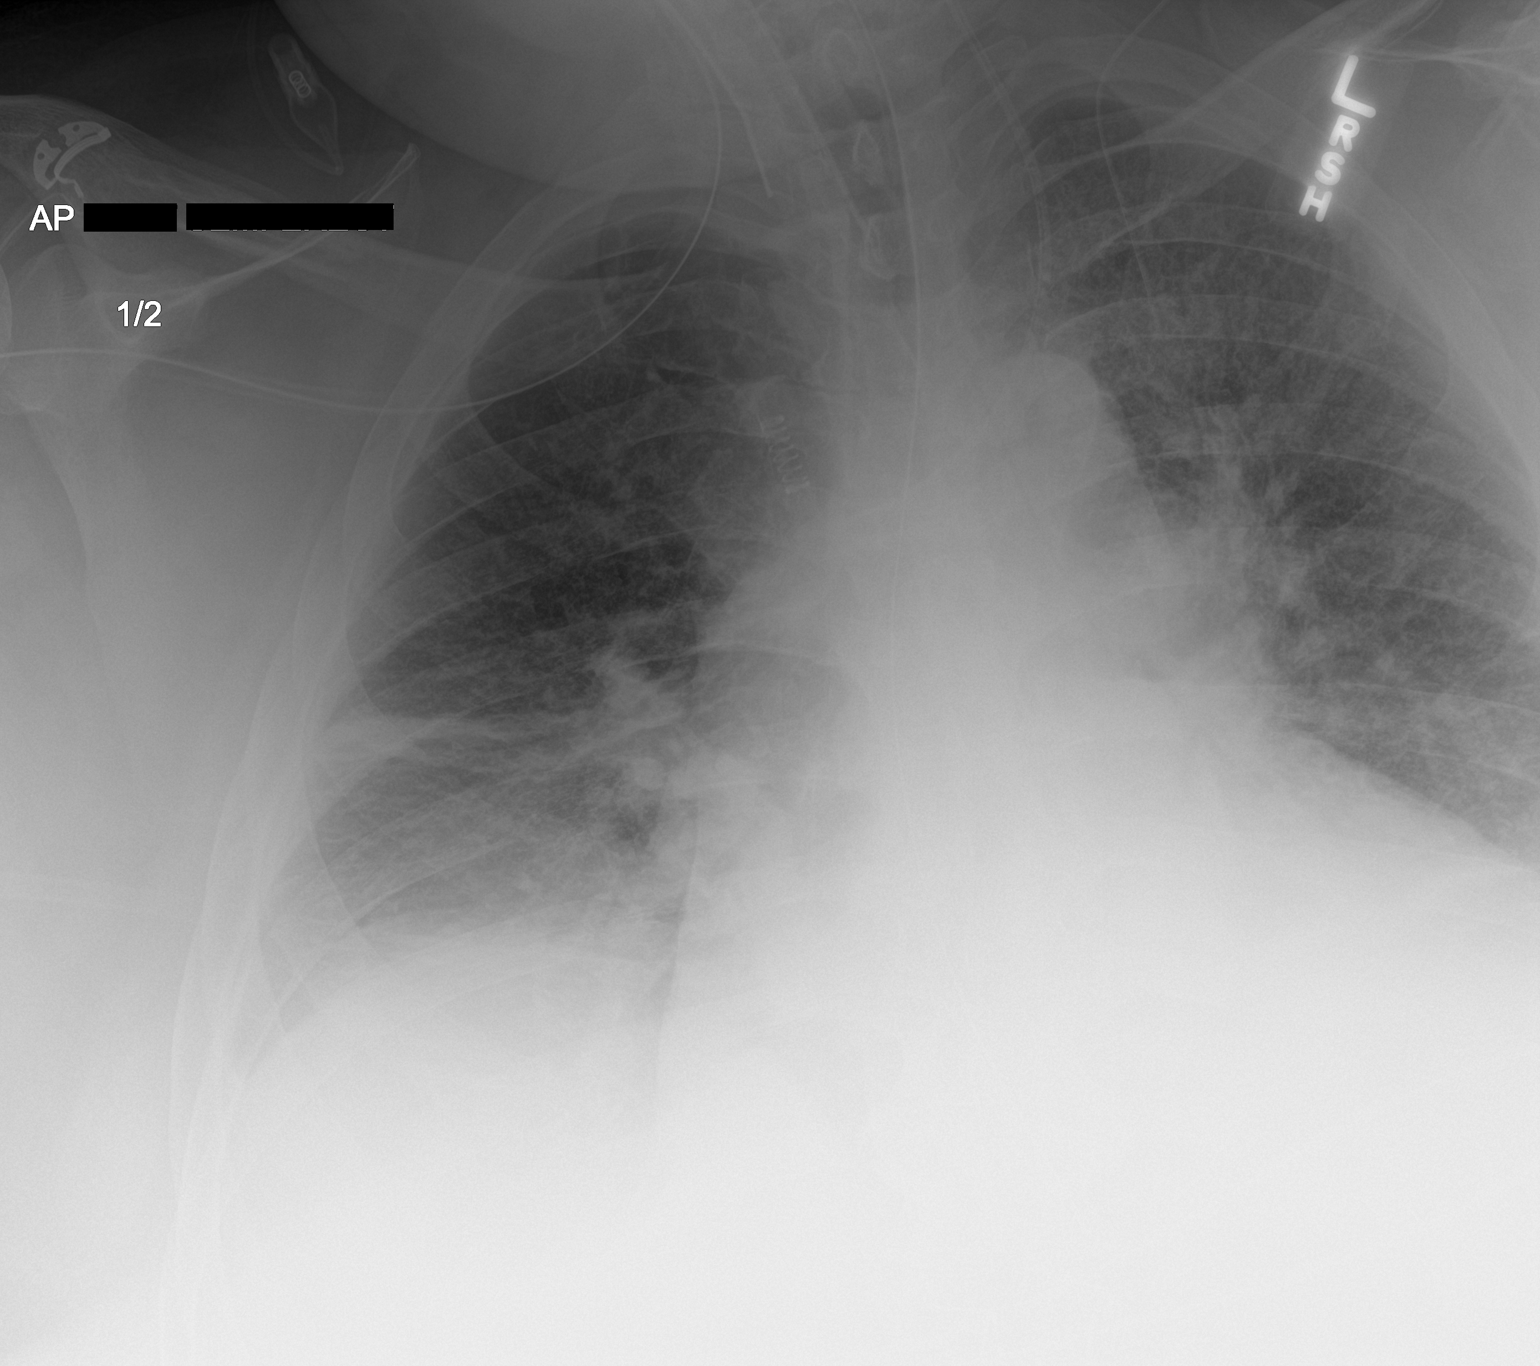
[im 2/2]
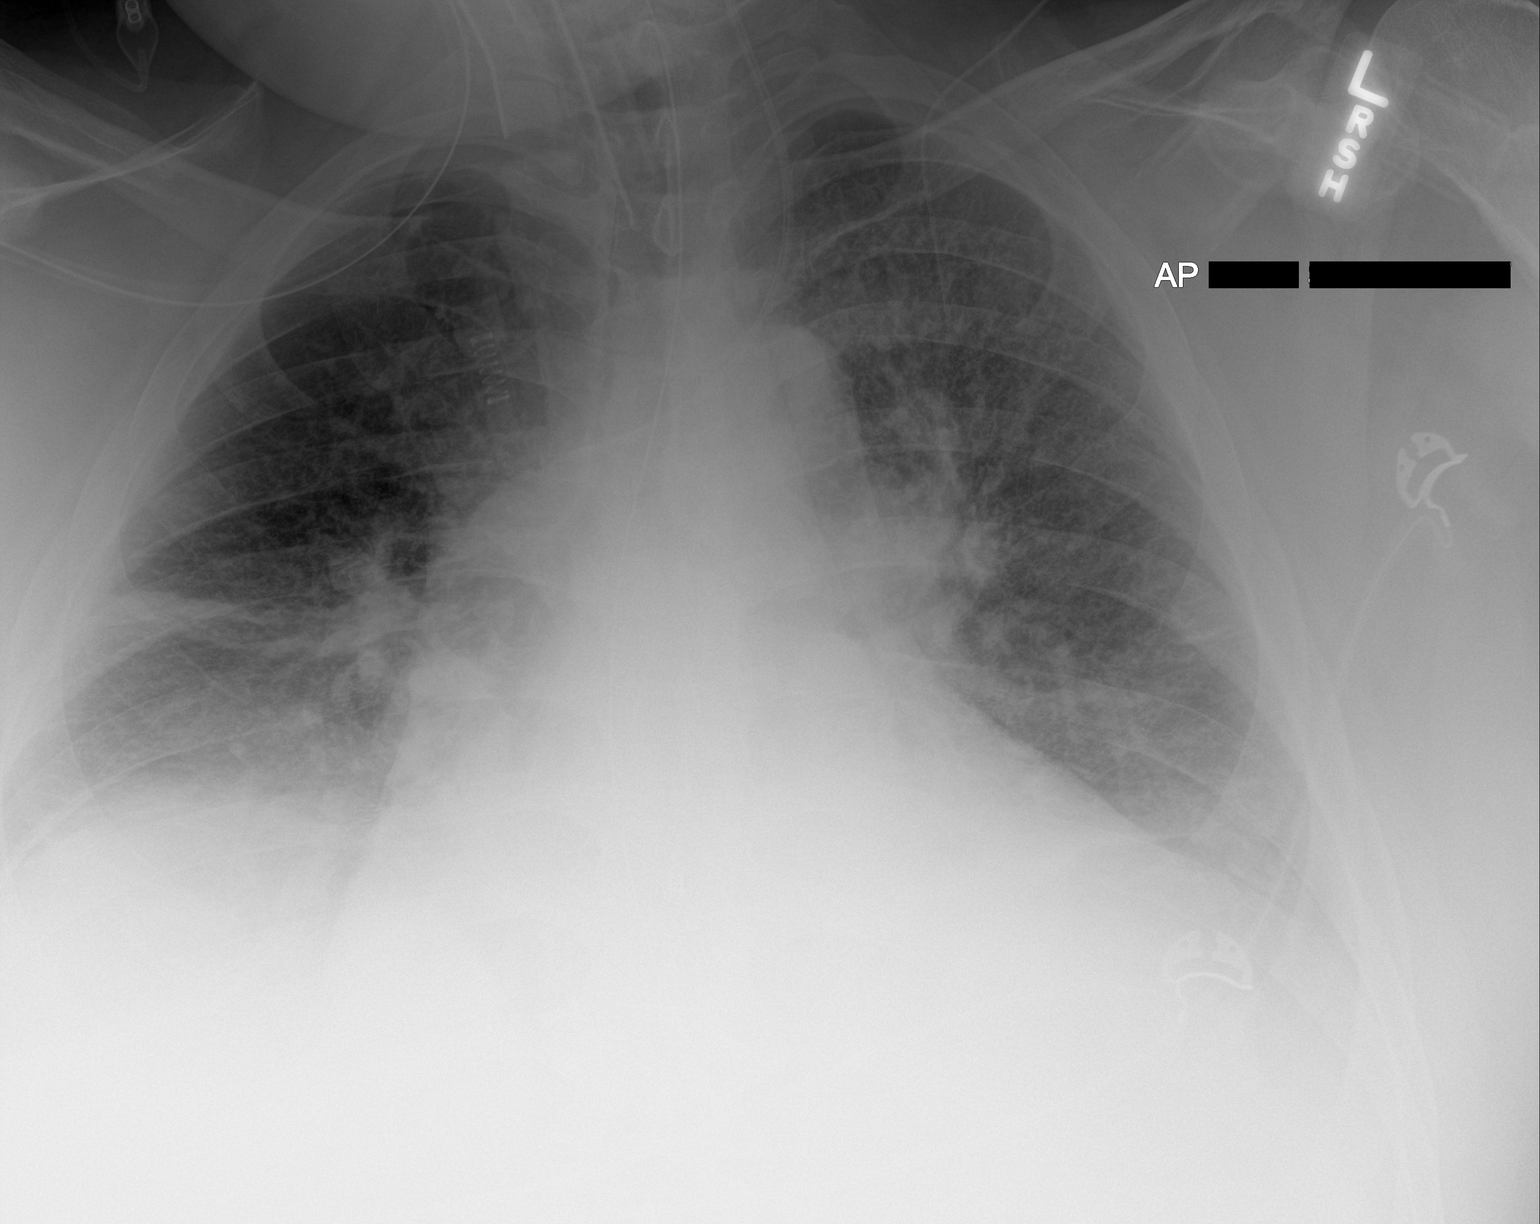

[2 of 2 positions shown; findings below may reference images not displayed]

FINDINGS: The lungs are borderline hypoinflated. The interstitial markings
remain increased bilaterally. Confluent density in the right mid to
lower lung is stable. The cardiac silhouette remains enlarged. The
pulmonary vascularity remains engorged. The endotracheal tube tip
projects between the clavicular heads approximately 7.5 cm above the
carina. The esophagogastric tube tip and proximal port project below
the GE junction. The left internal jugular venous catheter tip
projects at the junction of the right and left brachiocephalic
veins.
IMPRESSION: Borderline hypoinflation. Persistent pulmonary interstitial edema.
Persistent atelectasis in the right mid to lower lung. A stable
cardiomegaly and pulmonary vascular congestion. The support tubes
are in reasonable position.

## 2019-03-18 IMAGING — DX DG CHEST 1V PORT
2 series · 2 of 2 positions shown · non-contrast
Comparison: 09/20/2018

CLINICAL DATA: Respiratory failure.

EXAM:
PORTABLE CHEST 1 VIEW

[chest ap (1 of 2)]
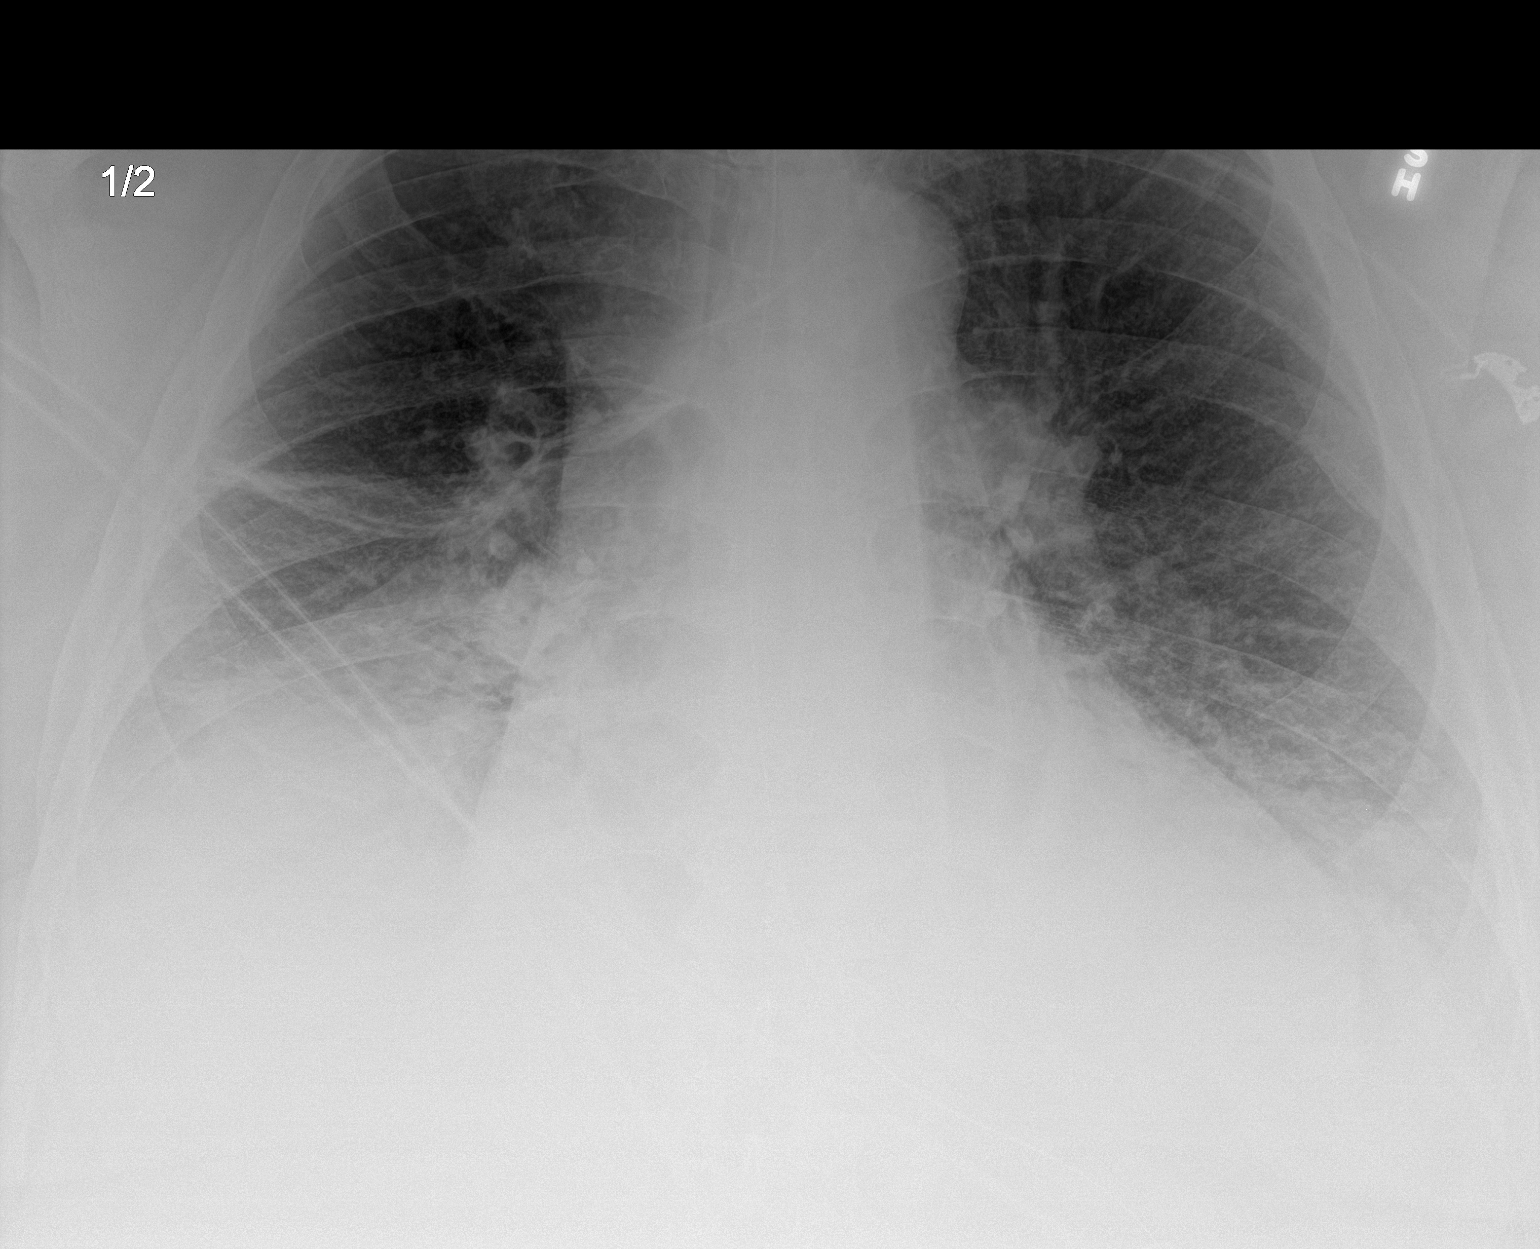

[chest ap (2 of 2)]
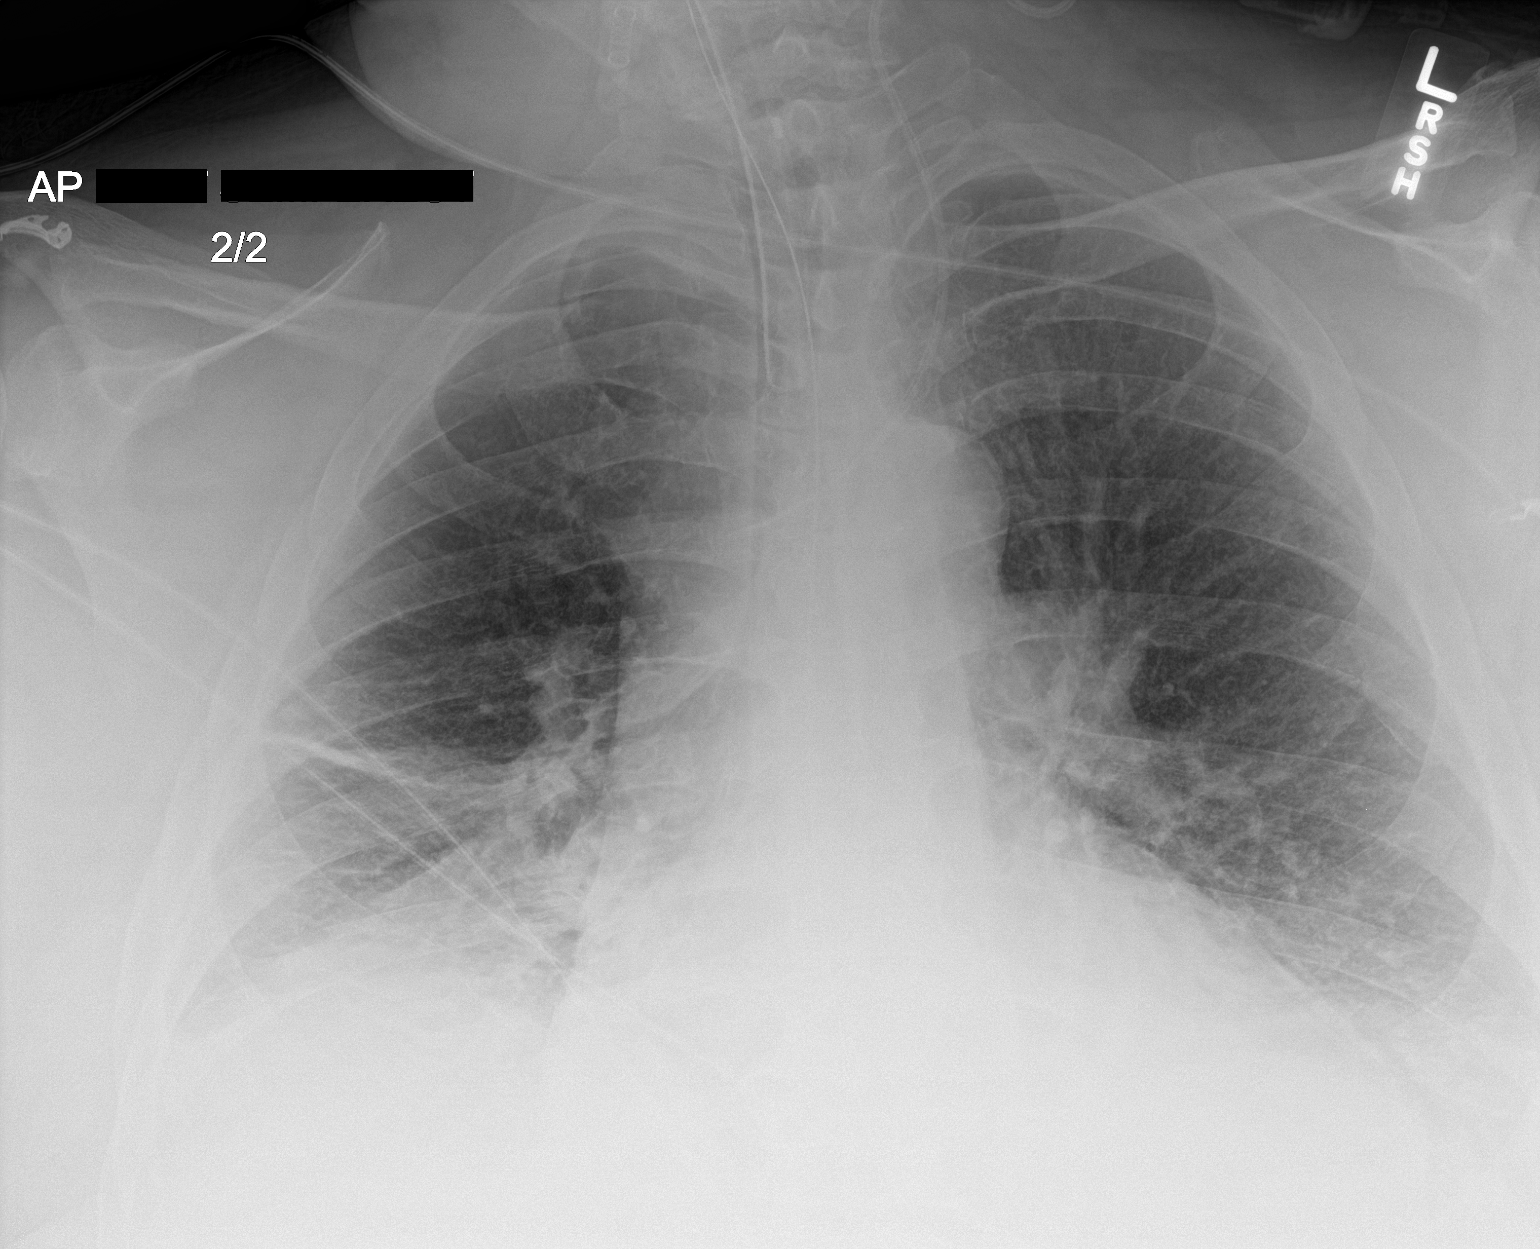

[2 of 2 positions shown; findings below may reference images not displayed]

FINDINGS: Endotracheal tube terminates at the inferior margin of the
clavicular heads, unchanged. Enteric tube courses into the left
upper abdomen with tip not imaged. Left jugular catheter terminates
over the upper SVC, unchanged. The cardiac silhouette remains
enlarged. Patchy and curvilinear opacities in both lung bases are
unchanged. There are likely persistent small bilateral pleural
effusions. Mild pulmonary vascular congestion is unchanged. No
pneumothorax is identified.
IMPRESSION: Unchanged cardiomegaly, pulmonary vascular congestion, bibasilar
atelectasis, and small pleural effusions.

## 2019-12-11 DEATH — deceased
